# Patient Record
Sex: Male | Born: 1935
Health system: Southern US, Community
[De-identification: ages and names within clinical notes are randomized; demographics above are authoritative.]

## PROBLEM LIST (undated history)

## (undated) DIAGNOSIS — I10 Essential (primary) hypertension: Secondary | ICD-10-CM

## (undated) DIAGNOSIS — E119 Type 2 diabetes mellitus without complications: Secondary | ICD-10-CM

## (undated) DIAGNOSIS — J449 Chronic obstructive pulmonary disease, unspecified: Secondary | ICD-10-CM

## (undated) DIAGNOSIS — F32A Depression, unspecified: Secondary | ICD-10-CM

## (undated) DIAGNOSIS — E785 Hyperlipidemia, unspecified: Secondary | ICD-10-CM

## (undated) DIAGNOSIS — E7800 Pure hypercholesterolemia, unspecified: Secondary | ICD-10-CM

## (undated) DIAGNOSIS — K219 Gastro-esophageal reflux disease without esophagitis: Secondary | ICD-10-CM

## (undated) DIAGNOSIS — N189 Chronic kidney disease, unspecified: Secondary | ICD-10-CM

## (undated) DIAGNOSIS — N400 Enlarged prostate without lower urinary tract symptoms: Secondary | ICD-10-CM

## (undated) HISTORY — PX: FOOT SURGERY: SHX648

## (undated) HISTORY — PX: EYE SURGERY: SHX253

---

## 2004-06-17 ENCOUNTER — Ambulatory Visit: Payer: Self-pay | Admitting: Family Medicine

## 2004-08-14 ENCOUNTER — Ambulatory Visit: Payer: Self-pay | Admitting: Family Medicine

## 2004-12-04 ENCOUNTER — Ambulatory Visit: Payer: Self-pay | Admitting: Family Medicine

## 2005-02-05 ENCOUNTER — Ambulatory Visit: Payer: Self-pay | Admitting: Family Medicine

## 2005-06-09 ENCOUNTER — Ambulatory Visit: Payer: Self-pay | Admitting: Family Medicine

## 2005-09-30 ENCOUNTER — Ambulatory Visit: Payer: Self-pay | Admitting: Family Medicine

## 2006-01-28 ENCOUNTER — Ambulatory Visit: Payer: Self-pay | Admitting: Family Medicine

## 2006-06-06 ENCOUNTER — Ambulatory Visit: Payer: Self-pay | Admitting: Family Medicine

## 2006-06-08 ENCOUNTER — Ambulatory Visit: Payer: Self-pay | Admitting: Family Medicine

## 2006-10-08 ENCOUNTER — Ambulatory Visit: Payer: Self-pay | Admitting: Family Medicine

## 2009-05-31 ENCOUNTER — Ambulatory Visit: Payer: Self-pay | Admitting: Internal Medicine

## 2009-05-31 DIAGNOSIS — R05 Cough: Secondary | ICD-10-CM

## 2009-05-31 DIAGNOSIS — I10 Essential (primary) hypertension: Secondary | ICD-10-CM | POA: Insufficient documentation

## 2009-05-31 DIAGNOSIS — Z8669 Personal history of other diseases of the nervous system and sense organs: Secondary | ICD-10-CM

## 2009-05-31 DIAGNOSIS — E785 Hyperlipidemia, unspecified: Secondary | ICD-10-CM | POA: Insufficient documentation

## 2009-05-31 DIAGNOSIS — E782 Mixed hyperlipidemia: Secondary | ICD-10-CM | POA: Insufficient documentation

## 2009-05-31 HISTORY — DX: Personal history of other diseases of the nervous system and sense organs: Z86.69

## 2009-07-04 ENCOUNTER — Ambulatory Visit: Payer: Self-pay | Admitting: Internal Medicine

## 2009-08-07 ENCOUNTER — Ambulatory Visit: Payer: Self-pay | Admitting: Internal Medicine

## 2009-08-07 LAB — CONVERTED CEMR LAB
Basophils Absolute: 0.1 10*3/uL (ref 0.0–0.1)
Calcium: 9.4 mg/dL (ref 8.4–10.5)
Chloride: 104 meq/L (ref 96–112)
Creatinine, Ser: 1.2 mg/dL (ref 0.4–1.5)
Eosinophils Absolute: 0.6 10*3/uL (ref 0.0–0.7)
GFR calc non Af Amer: 62.87 mL/min (ref >60)
HCT: 43.6 % (ref 39.0–52.0)
Hemoglobin: 14.5 g/dL (ref 13.0–17.0)
Lymphocytes Relative: 28.1 % (ref 12.0–46.0)
MCHC: 33.3 g/dL (ref 30.0–36.0)
MCV: 101.4 fL — ABNORMAL HIGH (ref 78.0–100.0)
Monocytes Absolute: 0.9 10*3/uL (ref 0.1–1.0)
Monocytes Relative: 11.1 % (ref 3.0–12.0)
Neutro Abs: 4.4 10*3/uL (ref 1.4–7.7)
Neutrophils Relative %: 52.5 % (ref 43.0–77.0)
Platelets: 238 10*3/uL (ref 150.0–400.0)
Sodium: 141 meq/L (ref 135–145)

## 2009-08-08 ENCOUNTER — Encounter: Admission: RE | Admit: 2009-08-08 | Discharge: 2009-08-08 | Payer: Self-pay | Admitting: Internal Medicine

## 2009-09-06 ENCOUNTER — Ambulatory Visit: Payer: Self-pay | Admitting: Internal Medicine

## 2009-10-08 ENCOUNTER — Ambulatory Visit: Payer: Self-pay | Admitting: Internal Medicine

## 2009-10-08 DIAGNOSIS — J310 Chronic rhinitis: Secondary | ICD-10-CM

## 2009-10-31 ENCOUNTER — Telehealth (INDEPENDENT_AMBULATORY_CARE_PROVIDER_SITE_OTHER): Payer: Self-pay | Admitting: *Deleted

## 2009-11-19 ENCOUNTER — Ambulatory Visit: Payer: Self-pay | Admitting: Internal Medicine

## 2009-12-03 ENCOUNTER — Ambulatory Visit: Payer: Self-pay | Admitting: Internal Medicine

## 2010-01-14 ENCOUNTER — Ambulatory Visit: Payer: Self-pay | Admitting: Internal Medicine

## 2010-04-22 ENCOUNTER — Ambulatory Visit: Payer: Self-pay | Admitting: Internal Medicine

## 2010-07-22 ENCOUNTER — Ambulatory Visit: Payer: Self-pay | Admitting: Internal Medicine

## 2010-09-10 NOTE — Progress Notes (Signed)
Summary: instructions/ inhaler > stay on qvar  Phone Note Call from Patient Call back at Home Phone [phone]   Caller: Patient Call For: wert Summary of Call: pt is using QVAR and is doing well. has followed all dr wert's instructions. wants to know if it's "necessary that he use the QVAR" .  Leave detailed msg per pt Initial call taken by: Tivis Ringer, CNA,  October 31, 2009 9:25 AM  Follow-up for Phone Call        Dr. Sherene Sires, please advise if you want pt to stay on the qvar thanks Vernie Murders  October 31, 2009 9:28 AM It is maintenance inhaler, not as needed so he should stay on it until next ov if at all possible (can give samples) Follow-up by: Nyoka Cowden MD,  October 31, 2009 10:17 AM  Additional Follow-up for Phone Call Additional follow up Details #1::        Spoke with pt and advised that he needs to stay on the qvar at least until we see him back.  He states that he actually never started using qvar.  He states that after finished abx and pred taper his cough and increased SOB have resolved.  He actually wants to know if he needs to start this med.  Please advise thanks Additional Follow-up by: Vernie Murders,  October 31, 2009 10:32 AM    Additional Follow-up for Phone Call Additional follow up Details #2::    ok to leave off but at the very first sign of a relapse start the qvar Follow-up by: Nyoka Cowden MD,  October 31, 2009 1:00 PM  Additional Follow-up for Phone Call Additional follow up Details #3:: Details for Additional Follow-up Action Taken: [facility] advising pt of the above recs per MW per pt request. Vernie Murders  October 31, 2009 1:06 PM

## 2010-09-10 NOTE — Assessment & Plan Note (Signed)
Summary: Pulmonary/ ext f/u ov with hfa 50%, start qvar 40 as maint rx   Visit Type:  Follow-up Copy to:  Dr. Lysbeth Galas Primary Provider/Referring Provider:  Dr. Lysbeth Galas  CC:  Pt here for follow up. Pt c/o productive cough returning 1 1/2 week ago in the AM's with yellow to beige mucus. .  History of Present Illness: 74 yowm quit smoking around 1998 with no resp problems then,stopped due to wife's sensitivity.  May 31, 2009 ov for initial cough started in Fall 2009 eval by Dr Lazarus Salines dx with GERD while on prilosec rec two times a day dosing but no effect.   c/o early am and after supper worse also at hs but does not typically wake him up.  in am cough up < tbsp of clear mucus, otherwise dry. Assoc with nasal congestion worse than usual with occ sneezing.  rec   Prednisone 10 mg 4 each am x 2days, 2x2days, 1x2days and stop  Pepcid ac 20 mg at bedtime Dexilant 60 mg Take  one 30-60 min before first meal of the day   July 04, 2009 1 month followup.  Pt still c/o cough.  States that cough is prod in the am with clear sputum.  He feels that cough gets better during the day and gets worse at night- especially worse when lies down.  back on prilosec at hs and following the diet, feels prednisone worked the best anything has ever worked.  rec singulair and astepro and continue ppi two times a day plus pepcid at bedtime  August 07, 2009 1 month followup.  Pt states that cough has worsened over the past 2 wks.  Cough is prod with clear sptutum.  Pt states the cough bothers him all day and night now.  Most of the mucus is first thing in am. astepro helping nasal discharge. rec Astepro at bedtime  stop  singulair 10 mg one in evening  Pepcid 20 mg one at bedtime Start Chlortrimeton 4 mg one at bedtime but stop it if it bothers your urination.   September 06, 2009 1 month followup.  Pt states that his breathing has been okay.  He feels that he does better while on prednisone.  He c/o prod cough with  clear sputum x 2 wks day > night or early am. rec prednisone and omnaris,  100% better while on prednisone, then caught cold now mucus is yellow in amFebruary 28, 2011 4 wk followup.  Pt c/o cough x 1 wk- early am cough is prod with yellow sputum.  He also states that he has had slight increase in SOB.   Did 100% better the month of March after rx with one daily prilosec, omnaris, short course of prednsione and augmentin.rec qvar but did not start it but did well the muonth of March on omnaris and prilosec.  November 19, 2009 Pt here for follow up. Pt c/o productive cough returning 1 1/2 week ago in the AM's with yellow to beige mucus. Pt denies any significant sore throat, dysphagia, itching, sneezing,  nasal congestion or excess secretions,  fever, chills, sweats, unintended wt loss, pleuritic or exertional cp, hempoptysis, change in activity tolerance  orthopnea pnd or leg swelling Pt also denies any obvious fluctuation in symptoms with weather or environmental change or other alleviating or aggravating factors.       Current Medications (verified): 1)  Diovan 160 Mg Tabs (Valsartan) .Marland Kitchen.. 1 Once Daily 2)  Prilosec Otc 20 Mg Tbec (Omeprazole Magnesium) .Marland KitchenMarland KitchenMarland Kitchen  Take One 30-60 Min Before First and Last Meals of The Day 3)  Pepcid 20 Mg Tabs (Famotidine) .... One At Bedtime 4)  Refresh Tears 0.5 % Soln (Carboxymethylcellulose Sodium) .Marland Kitchen.. 1 Drop Each Eye Two Times A Day 5)  Chlor-Trimeton 4 Mg Tabs (Chlorpheniramine Maleate) .... Take 1 Tab By Mouth At Bedtime 6)  Omnaris 50 Mcg/act Susp (Ciclesonide) .... One Twice Daily Each Nostril 7)  Simvastatin 20 Mg Tabs (Simvastatin) .Marland Kitchen.. 1 At Bedtime 8)  Multivitamins   Tabs (Multiple Vitamin) .... Take 1 Tablet By Mouth Once A Day 9)  Aspirin 81 Mg  Tabs (Aspirin) .... Take 1 Tablet By Mouth Once A Day 10)  Nasal Saline .... As Needed  Allergies (verified): No Known Drug Allergies  Past History:  Past Medical History: RETINAL DETACHMENT, BILATERAL, HX OF  (ICD-V12.49) HYPERTENSION (ICD-401.9) HYPERLIPIDEMIA (ICD-272.4) COUGH.................................................................Marland KitchenWert     - onset 2009     - w/u by [facility] 2010     - Start Singulair July 04, 2009 > no response August 07, 2009  so d/c     - Sinus and Chest CT ordered August 07, 2009 > chronic thickening of sinuses only finding with coronary       calcifications      - HFA 50% November 19, 2009   Vital Signs:  Patient profile:   77 year old male Height:      73 inches Weight:      221 pounds O2 Sat:      93 % on Room air Temp:     98.2 degrees F Pulse rate:   77 / minute BP sitting:   130 / 74  (left arm) Cuff size:   large  Vitals Entered By: Zackery Barefoot CMA (November 19, 2009 9:12 AM)  O2 Flow:  Room air CC: Pt here for follow up. Pt c/o productive cough returning 1 1/2 week ago in the AM's with yellow to beige mucus.  Comments Medications reviewed with patient Verified contact number and pharmacy with patient Zackery Barefoot [facility]  November 19, 2009 9:13 AM     Physical Exam  Additional Exam:  wt  222  May 31, 2009 >  229 August 07, 2009 > 228 September 06, 2009 > 228 October 08, 2009 > 221 November 19, 2009  hoarse pleasant amb wm nad  HEENT: nl dentition,  and orophanx. Nl external ear canals without cough reflex, mod turbinate edema, non specific NECK :  without JVD/Nodes/TM/ nl carotid upstrokes bilaterally LUNGS: no acc muscle use, clear to A and P bilaterally without cough on insp or exp maneuvers CV:  RRR  no s3 or murmur or increase in P2, no edema  ABD:  soft and nontender with nl excursion in the supine position. No bruits or organomegaly, bowel sounds nl MS:  warm without deformities, calf tenderness, cyanosis or clubbing      Impression & Recommendations:  Problem # 1:  COUGH (ICD-786.2)  The most common causes of chronic cough in immunocompetent adults include: upper airway cough syndrome (UACS), previously referred to as  postnasal drip syndrome,  caused by variety of rhinosinus conditions; (2) asthma; (3) GERD; (4) chronic bronchitis from cigarette smoking or other inhaled environmental irritants; (5) nonasthmatic eosinophilic bronchitis; and (6) bronchiectasis. These conditions, singly or in combination, have accounted for up to 94% of the causes of chronic cough in prospective studies.    This cough is impressively responsive to prednisone but not max gerd so ddx is eos rhinitis/sinusitis  vs bronchitis which may require fob for eval   I spent extra time with the patient today explaining optimal mdi  technique.  This improved from  25-50 % though immediate cough on insp so needs to wait until cough is better and this time go ahead and start qvar to cover the possibility of cough variant asthma.   Each maintenance medication was reviewed in detail including most importantly the difference between maintenance and as needed and under what circumstances the prns are to be used. struggling with concept of med reconciliation.  To keep things simple, I have asked the patient to first separate medicines that are perceived as maintenance, that is to be taken daily "no matter what", from those medicines that are taken on only on an as-needed basis and I have given the patient examples of both, and then return to see our NP to generate a  detailed  medication calendar which should be followed until the next physician sees the patient and updates it.   Once we're sure that we're all reading from the same page in terms of medication admiistration, she needs to be scheduled to follow up with me   Orders: Est. Patient Level IV (03474)  Medications Added to Medication List This Visit: 1)  Refresh Tears 0.5 % Soln (Carboxymethylcellulose sodium) .Marland Kitchen.. 1 drop each eye two times a day 2)  Multivitamins Tabs (Multiple vitamin) .... Take 1 tablet by mouth once a day 3)  Aspirin 81 Mg Tabs (Aspirin) .... Take 1 tablet by mouth once a  day 4)  Nasal Saline  .... As needed 5)  Chlor-trimeton 4 Mg Tabs (Chlorpheniramine maleate) .... Take 1 tab by mouth at bedtime 6)  Qvar 40 Mcg/act Aers (Beclomethasone dipropionate) .... 2 puffs first thing  in am and 2 puffs again in pm about 12 hours later 7)  Prednisone 10 Mg Tabs (Prednisone) .... 4 each am x 2days, 2x2days, 1x2days and stop 8)  Augmentin 875-125 Mg Tabs (Amoxicillin-pot clavulanate) .... By mouth twice daily  Patient Instructions: 1)  Only  Take prilosec  one 30-60 min before first meal of the day 2)  stay on omnaris 3)  Augmentin x 10 days 4)  Prednisone 10 mg 4 each am x 2 days,  3 x 2days, 2x2 days, and 1x2 days  5)  Qvar 40 2 puffs first thing  in am and 2 puffs again in pm about 12 hours later- don't start until cough is better 6)  Please schedule a follow-up appointment in 6 weeks, sooner if needed  7)  Work on inhaler technique:  relax and blow all the way out then take a nice smooth deep breath back in, triggering the inhaler at same time you start breathing in and hold for at least a few secs 8)  See Tammy NP w/in 2 weeks with all your medications, even over the counter meds, separated in two separate bags, the ones you take no matter what vs the ones you stop once you feel better and take only as needed.  She will generate for you a new user friendly medication calendar that will put [place] all on the same page re: your medication use.  Prescriptions: AUGMENTIN 875-125 MG  TABS (AMOXICILLIN-POT CLAVULANATE) By mouth twice daily  #20 x 0   Entered and Authorized by:   Nyoka Cowden MD   Signed by:   Nyoka Cowden MD on 11/19/2009   Method used:   Print then Give to Patient  RxID:   [id number] PREDNISONE 10 MG  TABS (PREDNISONE) 4 each am x 2days, 2x2days, 1x2days and stop  #14 x 0   Entered and Authorized by:   Nyoka Cowden MD   Signed by:   Nyoka Cowden MD on 11/19/2009   Method used:   Print then Give to Patient   RxID:    [id number]    Immunization History:  Influenza Immunization History:    Influenza:  historical (05/14/2009)

## 2010-09-10 NOTE — Assessment & Plan Note (Signed)
Summary: NP follow up - med calendar   Copy to:  Dr. Lysbeth Galas Primary Provider/Referring Provider:  Dr. Lysbeth Galas  CC:  new med calendar, pt brought all meds with him today - pt c/o prod cough with yellow mucus x3days, denies wheezing, and SOB.  History of Present Illness: 67 yowm quit smoking around 1998 with no resp problems then,stopped due to wife's sensitivity.  May 31, 2009 ov for initial cough started in Fall 2009 eval by Dr Lazarus Salines dx with GERD while on prilosec rec two times a day dosing but no effect.   c/o early am and after supper worse also at hs but does not typically wake him up.  in am cough up < tbsp of clear mucus, otherwise dry. Assoc with nasal congestion worse than usual with occ sneezing.  rec   Prednisone 10 mg 4 each am x 2days, 2x2days, 1x2days and stop  Pepcid ac 20 mg at bedtime Dexilant 60 mg Take  one 30-60 min before first meal of the day   July 04, 2009 1 month followup.  Pt still c/o cough.  States that cough is prod in the am with clear sputum.  He feels that cough gets better during the day and gets worse at night- especially worse when lies down.  back on prilosec at hs and following the diet, feels prednisone worked the best anything has ever worked.  rec singulair and astepro and continue ppi two times a day plus pepcid at bedtime  August 07, 2009 1 month followup.  Pt states that cough has worsened over the past 2 wks.  Cough is prod with clear sptutum.  Pt states the cough bothers him all day and night now.  Most of the mucus is first thing in am. astepro helping nasal discharge. rec Astepro at bedtime  stop  singulair 10 mg one in evening  Pepcid 20 mg one at bedtime Start Chlortrimeton 4 mg one at bedtime but stop it if it bothers your urination.   September 06, 2009 1 month followup.  Pt states that his breathing has been okay.  He feels that he does better while on prednisone.  He c/o prod cough with clear sputum x 2 wks day > night or early am.  rec prednisone and omnaris,  100% better while on prednisone, then caught cold now mucus is yellow in amFebruary 28, 2011 4 wk followup.  Pt c/o cough x 1 wk- early am cough is prod with yellow sputum.  He also states that he has had slight increase in SOB.   Did 100% better the month of March after rx with one daily prilosec, omnaris, short course of prednsione and augmentin.rec qvar but did not start it but did well the muonth of March on omnaris and prilosec.  November 19, 2009 Pt here for follow up. Pt c/o productive cough returning 1 1/2 week ago in the AM's with yellow to beige mucus.    December 03, 2009--Presents for follow up and med review. Last visit w/ acute bronchitis/sinusitis. Tx w/ Augemntin and steroid taper. He is feeling much better. Now cough free. He is worried the cough will come back, he has been running in a cough cycle he gets better on abx/steroids then after finishing them cough starts to slowly come back. Currently he is cough free. He did start QVAR last week when cough stopped. Denies chest pain, dyspnea, orthopnea, hemoptysis, fever, n/v/d, edema, headache. We reviewed med and organized them into a med calendar.  Preventive Screening-Counseling & Management  Alcohol-Tobacco     Smoking Status: quit  Medications Prior to Update: 1)  Diovan 160 Mg Tabs (Valsartan) .Marland Kitchen.. 1 Once Daily 2)  Prilosec Otc 20 Mg Tbec (Omeprazole Magnesium) .... Take One 30-60 Min Before First and Last Meals of The Day 3)  Pepcid 20 Mg Tabs (Famotidine) .... One At Bedtime 4)  Refresh Tears 0.5 % Soln (Carboxymethylcellulose Sodium) .Marland Kitchen.. 1 Drop Each Eye Two Times A Day 5)  Omnaris 50 Mcg/act Susp (Ciclesonide) .... One Twice Daily Each Nostril 6)  Simvastatin 20 Mg Tabs (Simvastatin) .Marland Kitchen.. 1 At Bedtime 7)  Multivitamins   Tabs (Multiple Vitamin) .... Take 1 Tablet By Mouth Once A Day 8)  Aspirin 81 Mg  Tabs (Aspirin) .... Take 1 Tablet By Mouth Once A Day 9)  Nasal Saline .... As Needed 10)   Chlor-Trimeton 4 Mg Tabs (Chlorpheniramine Maleate) .... Take 1 Tab By Mouth At Bedtime 11)  Qvar 40 Mcg/act  Aers (Beclomethasone Dipropionate) .... 2 Puffs First Thing  in Am and 2 Puffs Again in Pm About 12 Hours Later 12)  Prednisone 10 Mg  Tabs (Prednisone) .... 4 Each Am X 2days, 2x2days, 1x2days and Stop 13)  Augmentin 875-125 Mg  Tabs (Amoxicillin-Pot Clavulanate) .... By Mouth Twice Daily  Allergies (verified): No Known Drug Allergies  Past History:  Family History: Last updated: 05/31/2009 Heart dz- Mother Colon CA- Sister  Social History: Last updated: 05/31/2009 Married Children Former smoker.  Quit in 1998.  Smoked for approx 40 yrs up to 1/2 ppd.  Smoked a pipe also.  No ETOH Retired  Past Medical History: RETINAL DETACHMENT, BILATERAL, HX OF (ICD-V12.49) HYPERTENSION (ICD-401.9) HYPERLIPIDEMIA (ICD-272.4) COUGH.................................................................Marland KitchenWert     - onset 2009     - w/u by [facility] 2010     - Start Singulair July 04, 2009 > no response August 07, 2009  so d/c     - Sinus and Chest CT ordered August 07, 2009 > chronic thickening of sinuses only finding with coronary       calcifications      - HFA 50% November 19, 2009  Complex med regimen--Meds reviewed with pt education and computerized med calendar completed December 03, 2009   Social History: Smoking Status:  quit  Review of Systems      See HPI  Vital Signs:  Patient profile:   77 year old male Height:      73 inches Weight:      222.25 pounds BMI:     29.43 O2 Sat:      95 % on Room air Temp:     98.8 degrees F oral Pulse rate:   78 / minute BP sitting:   142 / 84  (left arm) Cuff size:   regular  Vitals Entered By: Boone Master CNA (December 03, 2009 9:01 AM)  O2 Flow:  Room air CC: new med calendar, pt brought all meds with him today - pt c/o prod cough with yellow mucus x3days, denies wheezing, SOB Is Patient Diabetic? No Comments Medications reviewed  with patient Daytime contact number verified with patient. Boone Master CNA  December 03, 2009 9:02 AM    Physical Exam  Additional Exam:  wt  222  May 31, 2009 >  229 August 07, 2009 > 228 September 06, 2009 > 228 October 08, 2009 > 221 November 19, 2009 >>222 December 03, 2009  hoarse pleasant amb wm nad  HEENT: nl dentition,  and orophanx. Nl external  ear canals without cough reflex, mod turbinate edema, non specific NECK :  without JVD/Nodes/TM/ nl carotid upstrokes bilaterally LUNGS: no acc muscle use, clear to A and P bilaterally without cough on insp or exp maneuvers CV:  RRR  no s3 or murmur or increase in P2, no edema  ABD:  soft and nontender with nl excursion in the supine position. No bruits or organomegaly, bowel sounds nl MS:  warm without deformities, calf tenderness, cyanosis or clubbing      Impression & Recommendations:  Problem # 1:  COUGH (ICD-786.2)  Cyclical cough aggravated  by chronic rhinitis and suspected GERD.  Meds reviewed with pt education and computerized med calendar completed/adjusted.  Continue on GERD/Rhinitis prevention add delsym if cough returns.  follow up 6 weeks and as needed   Orders: Est. Patient Level III (16109) Prescription Created Electronically [phone])  Medications Added to Medication List This Visit: 1)  Fluticasone Propionate 50 Mcg/act Susp (Fluticasone propionate) .... 2 puffs each nostril two times a day  Complete Medication List: 1)  Diovan 160 Mg Tabs (Valsartan) .Marland Kitchen.. 1 once daily 2)  Prilosec Otc 20 Mg Tbec (Omeprazole magnesium) .... Take one 30-60 min before first and last meals of the day 3)  Pepcid 20 Mg Tabs (Famotidine) .... One at bedtime 4)  Refresh Tears 0.5 % Soln (Carboxymethylcellulose sodium) .Marland Kitchen.. 1 drop each eye two times a day 5)  Omnaris 50 Mcg/act Susp (Ciclesonide) .... One twice daily each nostril 6)  Simvastatin 20 Mg Tabs (Simvastatin) .Marland Kitchen.. 1 at bedtime 7)  Multivitamins Tabs (Multiple vitamin) ....  Take 1 tablet by mouth once a day 8)  Aspirin 81 Mg Tabs (Aspirin) .... Take 1 tablet by mouth once a day 9)  Nasal Saline  .... As needed 10)  Chlor-trimeton 4 Mg Tabs (Chlorpheniramine maleate) .... Take 1 tab by mouth at bedtime 11)  Qvar 40 Mcg/act Aers (Beclomethasone dipropionate) .... 2 puffs first thing  in am and 2 puffs again in pm about 12 hours later 12)  Prednisone 10 Mg Tabs (Prednisone) .... 4 each am x 2days, 2x2days, 1x2days and stop 13)  Augmentin 875-125 Mg Tabs (Amoxicillin-pot clavulanate) .... By mouth twice daily 14)  Fluticasone Propionate 50 Mcg/act Susp (Fluticasone propionate) .... 2 puffs each nostril two times a day  Patient Instructions: 1)  Follow med calendar closley and bring to each visit.  2)  GOAL IS TO GET AHEAD OF THE COUGH AND PREVENT IT-AVOID COUGHING AND THROAT CLEARING 3)  Use water, ice chips, sugarless candy-NO  MINTS, to help avoid coughing and throat clearing.  4)  Please contact office for sooner follow up if symptoms do not improve or worsen  5)  follow up 6 weeks Dr. Sherene Sires  uPrescriptions: FLUTICASONE PROPIONATE 50 MCG/ACT SUSP (FLUTICASONE PROPIONATE) 2 puffs each nostril two times a day  #1 x 11   Entered and Authorized by:   Rubye Oaks NP   Signed by:   Kunaal Walkins NP on 12/03/2009   Method used:   Print then Give to Patient   RxID:   [id number] QVAR 40 MCG/ACT  AERS (BECLOMETHASONE DIPROPIONATE) 2 puffs first thing  in am and 2 puffs again in pm about 12 hours later  #1 x 11   Entered and Authorized by:   Rubye Oaks NP   Signed by:   Aarian Griffie NP on 12/03/2009   Method used:   Print then Give to Patient   RxID:   [id number]  Appended Document: med calendar update    Clinical Lists Changes  Medications: Changed medication from PRILOSEC OTC 20 MG TBEC (OMEPRAZOLE MAGNESIUM) Take one 30-60 min before first and last meals of the day to PRILOSEC OTC 20 MG TBEC (OMEPRAZOLE MAGNESIUM) Take 1 tablet by mouth  once a day before meal Changed medication from CHLOR-TRIMETON 4 MG TABS (CHLORPHENIRAMINE MALEATE) Take 1 tab by mouth at bedtime to CHLOR-TRIMETON 4 MG TABS (CHLORPHENIRAMINE MALEATE) Take 2 tabs by mouth at bedtime Changed medication from OMNARIS 50 MCG/ACT SUSP (CICLESONIDE) one twice daily each nostril to OMNARIS 50 MCG/ACT SUSP (CICLESONIDE) 2 puffs each nostril two times a day Added new medication of CHLOR-TRIMETON 4 MG TABS (CHLORPHENIRAMINE MALEATE) 1 extra tab by mouth every 4 hours as needed Added new medication of DELSYM 30 MG/5ML LQCR (DEXTROMETHORPHAN POLISTIREX) 2 teaspoons every 12 hours as needed Changed medication from * NASAL SALINE as needed to SALINE NASAL SPRAY 0.65 % SOLN (SALINE) 2 puffs every 4 hours as needed Removed medication of AUGMENTIN 875-125 MG  TABS (AMOXICILLIN-POT CLAVULANATE) By mouth twice daily Removed medication of PREDNISONE 10 MG  TABS (PREDNISONE) 4 each am x 2days, 2x2days, 1x2days and stop Removed medication of ASPIRIN 81 MG  TABS (ASPIRIN) Take 1 tablet by mouth once a day Removed medication of MULTIVITAMINS   TABS (MULTIPLE VITAMIN) Take 1 tablet by mouth once a day Removed medication of REFRESH TEARS 0.5 % SOLN (CARBOXYMETHYLCELLULOSE SODIUM) 1 drop each eye two times a day Removed medication of PEPCID 20 MG TABS (FAMOTIDINE) one at bedtime

## 2010-09-10 NOTE — Assessment & Plan Note (Signed)
Summary: Pulmonary/  ext summary f/u cough c/w uacs, try omnaris   Copy to:  Dr. Lysbeth Galas Primary Provider/Referring Provider:  Dr. Lysbeth Galas  CC:  1 month followup.  Pt states that his breathing has been okay.  He feels that he does better while on prednisone.  He c/o prod cough with clear sputum x 2 wks.  .  History of Present Illness: 77 [place] Cooley quit smoking around 1998 with no resp problems then,stopped due to wife's sensitivity.  May 31, 2009 ov for initial cough started in Fall 2009 eval by Dr Lazarus Salines dx with GERD while on prilosec rec two times a day dosing but no effect.   c/o early am and after supper worse also at hs but does not typically wake him up.  in am cough up < tbsp of clear mucus, otherwise dry. Assoc with nasal congestion worse than usual with occ sneezing.  rec   Prednisone 10 mg 4 each am x 2days, 2x2days, 1x2days and stop  Pepcid ac 20 mg at bedtime Dexilant 60 mg Take  one 30-60 min before first meal of the day   July 04, 2009 1 month followup.  Pt still c/o cough.  States that cough is prod in the am with clear sputum.  He feels that cough gets better during the day and gets worse at night- especially worse when lies down.  back on prilosec at hs and following the diet, feels prednisone worked the best anything has ever worked.  rec singulair and astepro and continue ppi two times a day plus pepcid at bedtime  August 07, 2009 1 month followup.  Pt states that cough has worsened over the past 2 wks.  Cough is prod with clear sptutum.  Pt states the cough bothers him all day and night now.  Most of the mucus is first thing in am. astepro helping nasal discharge. rec Astepro at bedtime  stop  singulair 10 mg one in evening  Pepcid 20 mg one at bedtime Start Chlortrimeton 4 mg one at bedtime but stop it if it bothers your urination.   September 06, 2009 1 month followup.  Pt states that his breathing has been okay.  He feels that he does better while on prednisone.  He  c/o prod cough with clear sputum x 2 wks day > night or early am.  Pt denies any significant sore throat, dysphagia, itching, sneezing,  nasal congestion or excess secretions,  fever, chills, sweats, unintended wt loss, pleuritic or exertional cp, hempoptysis, change in activity tolerance  orthopnea pnd or leg swelling.   Current Medications (verified): 1)  Diovan 160 Mg Tabs (Valsartan) .Marland Kitchen.. 1 Once Daily 2)  Xlear (Otc) .... 2 Sprays Each Nostril Every Am 3)  Prilosec Otc 20 Mg Tbec (Omeprazole Magnesium) .... Take One 30-60 Min Before First and Last Meals of The Day 4)  Pepcid 20 Mg Tabs (Famotidine) .... One At Bedtime 5)  Refresh Tears 0.5 % Soln (Carboxymethylcellulose Sodium) .Marland Kitchen.. 1 Drop Each Eye Three Times A Day 6)  Chlor-Trimeton 4 Mg Tabs (Chlorpheniramine Maleate) .Marland Kitchen.. 1 Every 6 Hours As Needed 7)  Fluticasone Propionate 50 Mcg/act Susp (Fluticasone Propionate) .Marland Kitchen.. 1 Spray Each Nostril Two Times A Day  Allergies (verified): No Known Drug Allergies  Past History:  Past Medical History: RETINAL DETACHMENT, BILATERAL, HX OF (ICD-V12.49) HYPERTENSION (ICD-401.9) HYPERLIPIDEMIA (ICD-272.4) COUGH...............................................................Marland KitchenWert     - onset 2009     - w/u by [facility] 2010     - Start Singulair  July 04, 2009 > no response August 07, 2009  so d/c     - Sinus and Chest CT ordered August 07, 2009 > chronic thickening of sinuses only finding with coronary       calcifications  Vital Signs:  Patient profile:   77 year old male Weight:      228 pounds O2 Sat:      96 % on Room air Temp:     98.1 degrees F oral Pulse rate:   78 / minute BP sitting:   130 / 82  (left arm)  Vitals Entered By: Vernie Murders (September 06, 2009 9:08 AM)  O2 Flow:  Room air  Physical Exam  Additional Exam:  wt  222  May 31, 2009 > 226 July 04, 2009 > 229 August 07, 2009 > 228 September 06, 2009  hoarse pleasant amb wm nad HEENT: nl dentition,  and  orophanx. Nl external ear canals without cough reflex, mod turbinate edema, non specific NECK :  without JVD/Nodes/TM/ nl carotid upstrokes bilaterally LUNGS: no acc muscle use, clear to A and P bilaterally without cough on insp or exp maneuvers CV:  RRR  no s3 or murmur or increase in P2, no edema  ABD:  soft and nontender with nl excursion in the supine position. No bruits or organomegaly, bowel sounds nl MS:  warm without deformities, calf tenderness, cyanosis or clubbing      Impression & Recommendations:  Problem # 1:  COUGH (ICD-786.2)  The most common causes of chronic cough in immunocompetent adults include: upper airway cough syndrome (UACS), previously referred to as postnasal drip syndrome,  caused by variety of rhinosinus conditions; (2) asthma; (3) GERD; (4) chronic bronchitis from cigarette smoking or other inhaled environmental irritants; (5) nonasthmatic eosinophilic bronchitis; and (6) bronchiectasis. These conditions, singly or in combination, have accounted for up to 94% of the causes of chronic cough in prospective studies.  Feel  this is most c/w Upper airway cough syndrome, so named because it's frequently impossible to sort out how much is LPR vs CR/sinusitis with freq throat clearing generating secondary extra esophageal GERD from wide swings in gastric pressure that occur with throat clearing, promoting self use of mint and menthol lozenges that reduce the lower esophageal sphincter tone and exacerbate the problem further.  These symptoms are easily confused with asthma/copd by even experienced pulmonogists because they overlap so much. These are the same pts who not infrequently have failed to tolerate ace inhibitors,  dry powder inhalers or biphosphonates or report having reflux symptoms that don't respond to standard doses of PPI  REC max rx directed at rhinitis,  then consider trial of inhaled steroids to cover possibility of asthma.  Emphasized to pt  that unlike  when you get a prescription for eyeglasses, it's not possible to always walk out of this or any medical office with a perfect prescription that is immediately effective  based on any test that we offer here.  On the contrary, it may take several weeks for the full impact of changes recommened today - hopefully you will respond well.  If not, then we'll adjust your medication on your next visit accordingly, knowing more then than we can possibly know now.     Orders: Est. Patient Level IV (27062)  Medications Added to Medication List This Visit: 1)  Refresh Tears 0.5 % Soln (Carboxymethylcellulose sodium) .Marland Kitchen.. 1 drop each eye three times a day 2)  Chlor-trimeton 4 Mg Tabs (Chlorpheniramine maleate) .Marland KitchenMarland KitchenMarland Kitchen  1 every 6 hours as needed 3)  Fluticasone Propionate 50 Mcg/act Susp (Fluticasone propionate) .Marland Kitchen.. 1 spray each nostril two times a day 4)  Omnaris 50 Mcg/act Susp (Ciclesonide) .... One twice daily each nostril 5)  Prednisone 10 Mg Tabs (Prednisone) .... 4 each am x 2 days,  3 x 2days, 2x2 days, and 1x2 days  Patient Instructions: 1)  Prednisone 10 mg 4 each am x 2 days,  3 x 2days, 2x2 days, and 1x2 days  2)  Omnaris 1 twice daily each nostril  3)  Please schedule a follow-up appointment in 4 weeks, sooner if needed  Prescriptions: OMNARIS 50 MCG/ACT SUSP (CICLESONIDE) one twice daily each nostril  #1 x 11   Entered and Authorized by:   Nyoka Cowden MD   Signed by:   Nyoka Cowden MD on 09/06/2009   Method used:   Print then Give to Patient   RxID:   [id number] PREDNISONE 10 MG  TABS (PREDNISONE) 4 each am x 2 days,  3 x 2days, 2x2 days, and 1x2 days  #20 x 11   Entered and Authorized by:   Nyoka Cowden MD   Signed by:   Nyoka Cowden MD on 09/06/2009   Method used:   Print then Give to Patient   RxID:   [id number]

## 2010-09-10 NOTE — Assessment & Plan Note (Signed)
Summary: Pulmonary/ f/u ov   Copy to:  Dr. Lysbeth Galas Primary Provider/Referring Provider:  Dr. Lysbeth Galas  CC:  3 month followup.  Pt states breathing is the same- no better or worse.  He states that his cough is better since last seen- still has cough on occ "very little"- mainly non prod except for in the am sometimes produces clear sputum.Marland Kitchen  History of Present Illness: 77 yowm quit smoking around 1998 with no resp problems then, stopped due to wife's sensitivity.  May 31, 2009 ov for initial cough started in Fall 2009 eval by Dr Lazarus Salines dx with GERD while on prilosec rec two times a day dosing but no effect.   c/o early am and after supper worse also at hs but does not typically wake him up.  in am cough up < tbsp of clear mucus, otherwise dry. Assoc with nasal congestion worse than usual with occ sneezing.  rec   Prednisone 10 mg 4 each am x 2days, 2x2days, 1x2days and stop  Pepcid ac 20 mg at bedtime Dexilant 60 mg Take  one 30-60 min before first meal of the day   July 04, 2009 1 month followup.  Pt still c/o cough.  States that cough is prod in the am with clear sputum.  He feels that cough gets better during the day and gets worse at night- especially worse when lies down.  back on prilosec at hs and following the diet, feels prednisone worked the best anything has ever worked.  rec singulair and astepro and continue ppi two times a day plus pepcid at bedtime  August 07, 2009 1 month followup.  Pt states that cough has worsened over the past 2 wks.  Cough is prod with clear sptutum.  Pt states the cough bothers him all day and night now.  Most of the mucus is first thing in am. astepro helping nasal discharge. rec Astepro at bedtime  stop  singulair 10 mg one in evening  Pepcid 20 mg one at bedtime Start Chlortrimeton 4 mg one at bedtime but stop it if it bothers your urination.   September 06, 2009 1 month followup.  Pt states that his breathing has been okay.  He feels that he does  better while on prednisone.  He c/o prod cough with clear sputum x 2 wks day > night or early am. rec prednisone and omnaris,  100% better while on prednisone, then caught cold now mucus is yellow in amFebruary 28, 2011 4 wk followup.  Pt c/o cough x 1 wk- early am cough is prod with yellow sputum.  He also states that he has had slight increase in SOB.   Did 100% better the month of March after rx with one daily prilosec, omnaris, short course of prednsione and augmentin.rec qvar but did not start it but did well the muonth of March on omnaris and prilosec.  November 19, 2009 Pt here for follow up. Pt c/o productive cough returning 1 1/2 week ago in the AM's with yellow to beige mucus.    December 03, 2009--Presents for follow up and med review. Last visit w/ acute bronchitis/sinusitis. Tx w/ Augemntin and steroid taper. He is feeling much better. Now cough free. He is worried the cough will come back, he has been running in a cough cycle he gets better on abx/steroids then after finishing them cough starts to slowly come back. Currently he is cough free. He did start QVAR last week when cough stopped.  January 14, 2010 6 wk followup.  Pt states that his cough is much better.  Still has some occ cough in the am- mainly dry but sometimes produces some clear sputum. rec no change in rx , follow med calendar reviwed  April 22, 2010 3 month followup.  Pt states breathing is the same- no better or worse.  He states that his cough is better since last seen- still has cough on occ "very little"- mainly non prod except for in the am sometimes produces clear sputum. Pt denies any significant sore throat, dysphagia, itching, sneezing,  nasal congestion or excess secretions,  fever, chills, sweats, unintended wt loss, pleuritic or exertional cp, hempoptysis, change in activity tolerance  orthopnea pnd or leg swelling   Current Medications (verified): 1)  Diovan 160 Mg Tabs (Valsartan) .Marland Kitchen.. 1 Once Daily 2)  Simvastatin  20 Mg Tabs (Simvastatin) .Marland Kitchen.. 1 At Bedtime 3)  Prilosec Otc 20 Mg Tbec (Omeprazole Magnesium) .... Take 1 Tablet By Mouth Once A Day Before Meal 4)  Chlor-Trimeton 4 Mg Tabs (Chlorpheniramine Maleate) .... Take 2 Tabs By Mouth At Bedtime 5)  Fluticasone Propionate 50 Mcg/act Susp (Fluticasone Propionate) .... 2 Puffs Each Nostril Two Times A Day 6)  Qvar 40 Mcg/act  Aers (Beclomethasone Dipropionate) .... 2 Puffs First Thing  in Am and 2 Puffs Again in Pm About 12 Hours Later 7)  Chlor-Trimeton 4 Mg Tabs (Chlorpheniramine Maleate) .Marland Kitchen.. 1 Extra Tab By Mouth Every 4 Hours As Needed 8)  Delsym 30 Mg/3ml Lqcr (Dextromethorphan Polistirex) .... 2 Teaspoons Every 12 Hours As Needed 9)  Saline Nasal Spray 0.65 % Soln (Saline) .... 2 Puffs Every 4 Hours As Needed 10)  Optive 0.5-0.9 % Soln (Carboxymethylcellul-Glycerin) .... As Needed  Allergies (verified): No Known Drug Allergies  Past History:  Past Medical History: RETINAL DETACHMENT, BILATERAL, HX OF (ICD-V12.49) HYPERTENSION (ICD-401.9) HYPERLIPIDEMIA (ICD-272.4) COUGH...................................................................Marland KitchenWert     - onset 2009     - w/u by [facility] 2010     - Start Singulair July 04, 2009 > no response August 07, 2009  so d/c     - Sinus and Chest CT ordered August 07, 2009 > chronic thickening of sinuses only finding with coronary       calcifications      - HFA 50% November 19, 2009 > January 14, 2010 @ 90% pre-coaching > 100 % April 22, 2010       - Response to qvar 01/2010 Complex med regimen--Meds reviewed with pt education and computerized med calendar completed December 03, 2009   Vital Signs:  Patient profile:   77 year old male Weight:      219.50 pounds O2 Sat:      95 % on Room air Temp:     98.0 degrees F oral Pulse rate:   67 / minute BP sitting:   150 / 70  (left arm)  Vitals Entered By: Vernie Murders (April 22, 2010 9:19 AM)  O2 Flow:  Room air  Physical  Exam  Additional Exam:  wt  222  May 31, 2009 >       221 November 19, 2009 >>222 December 03, 2009 > 220 January 14, 2010 > 219 April 22, 2010  very minimall hoarse pleasant amb wm nad  HEENT: nl dentition,  and orophanx. Nl external ear canals without cough reflex, mod turbinate edema, non specific NECK :  without JVD/Nodes/TM/ nl carotid upstrokes bilaterally LUNGS: no acc muscle use, clear to  A and P bilaterally without cough on insp or exp maneuvers CV:  RRR  no s3 or murmur or increase in P2, no edema  ABD:  soft and nontender with nl excursion in the supine position. No bruits or organomegaly, bowel sounds nl MS:  warm without deformities, calf tenderness, cyanosis or clubbing      Impression & Recommendations:  Problem # 1:  COUGH (ICD-786.2)  Strong evidence this is chronic non-specific rhinitis with cough variant asthma based on response to empiric rx.  The standardized cough guidelines recently published in Chest are a 14 step process, not a single office visit,  and are intended  to address this problem logically,  with an alogrithm dependent on response to each progressive step  to determine a specific diagnosis with  minimal addtional testing needed. Therefore if compliance is an issue this empiric standardized approach simply won't work.   Thus critical he now stay on maint rx long enough to prove the case and only then very carefully taper off      Each maintenance medication was reviewed in detail including most importantly the difference between maintenance and as needed and under what circumstances the prns are to be used. This was done in the context of a medication calendar review(I reprinted his previous one)  which provided the patient with a user-friendly unambiguous mechanism for medication administration and reconciliation and provides an action plan for all active problems. It is critical that this be shown to every doctor  for modification during the office visit  if necessary so the patient can use it as a working document.      Orders: Est. Patient Level III (29562)  Patient Instructions: 1)  See calendar for specific medication instructions and bring it back for each and every office visit for every healthcare provider you see.  Without it,  you may not receive the best quality medical care that we feel you deserve.

## 2010-09-10 NOTE — Assessment & Plan Note (Signed)
Summary: Pulmonary/ summary f/u with [facility] now 100% and cough gone   Copy to:  Dr. Lysbeth Galas Primary Provider/Referring Provider:  Dr. Lysbeth Galas  CC:  6 wk followup.  Pt states that his cough is much better.  Still has some occ cough in the am- mainly dry but sometimes produces some clear sputum.  No complaints today.Marland Kitchen  History of Present Illness: 34 yowm quit smoking around 1998 with no resp problems then, stopped due to wife's sensitivity.  May 31, 2009 ov for initial cough started in Fall 2009 eval by Dr Lazarus Salines dx with GERD while on prilosec rec two times a day dosing but no effect.   c/o early am and after supper worse also at hs but does not typically wake him up.  in am cough up < tbsp of clear mucus, otherwise dry. Assoc with nasal congestion worse than usual with occ sneezing.  rec   Prednisone 10 mg 4 each am x 2days, 2x2days, 1x2days and stop  Pepcid ac 20 mg at bedtime Dexilant 60 mg Take  one 30-60 min before first meal of the day   July 04, 2009 1 month followup.  Pt still c/o cough.  States that cough is prod in the am with clear sputum.  He feels that cough gets better during the day and gets worse at night- especially worse when lies down.  back on prilosec at hs and following the diet, feels prednisone worked the best anything has ever worked.  rec singulair and astepro and continue ppi two times a day plus pepcid at bedtime  August 07, 2009 1 month followup.  Pt states that cough has worsened over the past 2 wks.  Cough is prod with clear sptutum.  Pt states the cough bothers him all day and night now.  Most of the mucus is first thing in am. astepro helping nasal discharge. rec Astepro at bedtime  stop  singulair 10 mg one in evening  Pepcid 20 mg one at bedtime Start Chlortrimeton 4 mg one at bedtime but stop it if it bothers your urination.   September 06, 2009 1 month followup.  Pt states that his breathing has been okay.  He feels that he does better while on  prednisone.  He c/o prod cough with clear sputum x 2 wks day > night or early am. rec prednisone and omnaris,  100% better while on prednisone, then caught cold now mucus is yellow in amFebruary 28, 2011 4 wk followup.  Pt c/o cough x 1 wk- early am cough is prod with yellow sputum.  He also states that he has had slight increase in SOB.   Did 100% better the month of March after rx with one daily prilosec, omnaris, short course of prednsione and augmentin.rec qvar but did not start it but did well the muonth of March on omnaris and prilosec.  November 19, 2009 Pt here for follow up. Pt c/o productive cough returning 1 1/2 week ago in the AM's with yellow to beige mucus.    December 03, 2009--Presents for follow up and med review. Last visit w/ acute bronchitis/sinusitis. Tx w/ Augemntin and steroid taper. He is feeling much better. Now cough free. He is worried the cough will come back, he has been running in a cough cycle he gets better on abx/steroids then after finishing them cough starts to slowly come back. Currently he is cough free. He did start QVAR last week when cough stopped.   January 14, 2010  6 wk followup.  Pt states that his cough is much better.  Still has some occ cough in the am- mainly dry but sometimes produces some clear sputum.  No complaints today. did not bring in med calendar for review as requested (at top of calendar in bold letters).  Pt denies any significant sore throat, dysphagia, itching, sneezing,  nasal congestion or excess secretions,  fever, chills, sweats, unintended wt loss, pleuritic or exertional cp, hempoptysis, change in activity tolerance  orthopnea pnd or leg swelling .  Current Medications (verified): 1)  Diovan 160 Mg Tabs (Valsartan) .Marland Kitchen.. 1 Once Daily 2)  Simvastatin 20 Mg Tabs (Simvastatin) .Marland Kitchen.. 1 At Bedtime 3)  Prilosec Otc 20 Mg Tbec (Omeprazole Magnesium) .... Take 1 Tablet By Mouth Once A Day Before Meal 4)  Chlor-Trimeton 4 Mg Tabs (Chlorpheniramine  Maleate) .... Take 2 Tabs By Mouth At Bedtime 5)  Fluticasone Propionate 50 Mcg/act Susp (Fluticasone Propionate) .... 2 Puffs Each Nostril Two Times A Day 6)  Qvar 40 Mcg/act  Aers (Beclomethasone Dipropionate) .... 2 Puffs First Thing  in Am and 2 Puffs Again in Pm About 12 Hours Later 7)  Chlor-Trimeton 4 Mg Tabs (Chlorpheniramine Maleate) .Marland Kitchen.. 1 Extra Tab By Mouth Every 4 Hours As Needed 8)  Delsym 30 Mg/46ml Lqcr (Dextromethorphan Polistirex) .... 2 Teaspoons Every 12 Hours As Needed 9)  Saline Nasal Spray 0.65 % Soln (Saline) .... 2 Puffs Every 4 Hours As Needed 10)  Famotidine 20 Mg Tabs (Famotidine) .Marland Kitchen.. 1 At Bedtime 11)  Optive 0.5-0.9 % Soln (Carboxymethylcellul-Glycerin) .... As Needed  Allergies (verified): No Known Drug Allergies  Past History:  Past Medical History: RETINAL DETACHMENT, BILATERAL, HX OF (ICD-V12.49) HYPERTENSION (ICD-401.9) HYPERLIPIDEMIA (ICD-272.4) COUGH..................................................................Marland KitchenWert     - onset 2009     - w/u by [facility] 2010     - Start Singulair July 04, 2009 > no response August 07, 2009  so d/c     - Sinus and Chest CT ordered August 07, 2009 > chronic thickening of sinuses only finding with coronary       calcifications      - HFA 50% November 19, 2009 > January 14, 2010 @ 90% pre-coaching Complex med regimen--Meds reviewed with pt education and computerized med calendar completed December 03, 2009   Vital Signs:  Patient profile:   77 year old male Weight:      220 pounds O2 Sat:      95 % on Room air Temp:     98.2 degrees F oral Pulse rate:   67 / minute BP sitting:   110 / 72  (left arm)  Vitals Entered By: Vernie Murders (January 14, 2010 9:11 AM)  O2 Flow:  Room air  Physical Exam  Additional Exam:  wt  222  May 31, 2009 >    > 228 October 08, 2009 > 221 November 19, 2009 >>222 December 03, 2009 > 220 January 14, 2010  hoarse pleasant amb wm nad  HEENT: nl dentition,  and orophanx. Nl external ear  canals without cough reflex, mod turbinate edema, non specific NECK :  without JVD/Nodes/TM/ nl carotid upstrokes bilaterally LUNGS: no acc muscle use, clear to A and P bilaterally without cough on insp or exp maneuvers CV:  RRR  no s3 or murmur or increase in P2, no edema  ABD:  soft and nontender with nl excursion in the supine position. No bruits or organomegaly, bowel sounds nl MS:  warm without  deformities, calf tenderness, cyanosis or clubbing      Impression & Recommendations:  Problem # 1:  COUGH (ICD-786.2)  Strong evidence this is chronic non-specific rhinitis with cough variant asthma based on response to empiric rx.  The standardized cough guidelines recently published in Chest are a 14 step process, not a single office visit,  and are intended  to address this problem logically,  with an alogrithm dependent on response to each progressive step  to determine a specific diagnosis with  minimal addtional testing needed. Therefore if compliance is an issue this empiric standardized approach simply won't work.   Thus critical he now stay on maint rx long enough to prove the case and only then very carefully taper off  I spent extra time with the patient today explaining optimal mdi  technique.  This improved from  90-100% after coaching.   Each maintenance medication was reviewed in detail including most importantly the difference between maintenance and as needed and under what circumstances the prns are to be used. This was done in the context of a medication calendar review(I reprinted his previous one)  which provided the patient with a user-friendly unambiguous mechanism for medication administration and reconciliation and provides an action plan for all active problems. It is critical that this be shown to every doctor  for modification during the office visit if necessary so the patient can use it as a working document.   Orders: Est. Patient Level IV (54098)  Medications  Added to Medication List This Visit: 1)  Famotidine 20 Mg Tabs (Famotidine) .Marland Kitchen.. 1 at bedtime 2)  Optive 0.5-0.9 % Soln (Carboxymethylcellul-glycerin) .... As needed  Patient Instructions: 1)  See calendar for specific medication instructions and bring it back for each and every office visit for every healthcare provider you see.  Without it,  you may not receive the best quality medical care that we feel you deserve.  2)  Return to office in 3 months, sooner if needed

## 2010-09-10 NOTE — Assessment & Plan Note (Signed)
Summary: Pulmonary/ ext ov with [facility] teaching   Copy to:  Dr. Lysbeth Galas Primary Provider/Referring Provider:  Dr. Lysbeth Galas  CC:  4 wk followup.  Pt c/o cough x 1 wk- early am cough is prod with yellow sputum.  He also states that he has had slight increase in SOB.  Marland Kitchen  History of Present Illness: 86 yowm quit smoking around 1998 with no resp problems then,stopped due to wife's sensitivity.  May 31, 2009 ov for initial cough started in Fall 2009 eval by Dr Lazarus Salines dx with GERD while on prilosec rec two times a day dosing but no effect.   c/o early am and after supper worse also at hs but does not typically wake him up.  in am cough up < tbsp of clear mucus, otherwise dry. Assoc with nasal congestion worse than usual with occ sneezing.  rec   Prednisone 10 mg 4 each am x 2days, 2x2days, 1x2days and stop  Pepcid ac 20 mg at bedtime Dexilant 60 mg Take  one 30-60 min before first meal of the day   July 04, 2009 1 month followup.  Pt still c/o cough.  States that cough is prod in the am with clear sputum.  He feels that cough gets better during the day and gets worse at night- especially worse when lies down.  back on prilosec at hs and following the diet, feels prednisone worked the best anything has ever worked.  rec singulair and astepro and continue ppi two times a day plus pepcid at bedtime  August 07, 2009 1 month followup.  Pt states that cough has worsened over the past 2 wks.  Cough is prod with clear sptutum.  Pt states the cough bothers him all day and night now.  Most of the mucus is first thing in am. astepro helping nasal discharge. rec Astepro at bedtime  stop  singulair 10 mg one in evening  Pepcid 20 mg one at bedtime Start Chlortrimeton 4 mg one at bedtime but stop it if it bothers your urination.   September 06, 2009 1 month followup.  Pt states that his breathing has been okay.  He feels that he does better while on prednisone.  He c/o prod cough with clear sputum x 2 wks day >  night or early am. rec prednisone and omnaris,  100% better while on prednisone, then caught cold now mucus is yellow in amFebruary 28, 2011 4 wk followup.  Pt c/o cough x 1 wk- early am cough is prod with yellow sputum.  He also states that he has had slight increase in SOB.  Pt denies any significant sore throat, dysphagia, itching, sneezing,  nasal congestion or excess secretions,  fever, chills, sweats, unintended wt loss, pleuritic or exertional cp, hempoptysis, change in activity tolerance  orthopnea pnd or leg swelling Pt also denies any obvious fluctuation in symptoms with weather or environmental change or other alleviating or aggravating factors.       Current Medications (verified): 1)  Diovan 160 Mg Tabs (Valsartan) .Marland Kitchen.. 1 Once Daily 2)  Prilosec Otc 20 Mg Tbec (Omeprazole Magnesium) .... Take One 30-60 Min Before First and Last Meals of The Day 3)  Pepcid 20 Mg Tabs (Famotidine) .... One At Bedtime 4)  Refresh Tears 0.5 % Soln (Carboxymethylcellulose Sodium) .Marland Kitchen.. 1 Drop Each Eye Three Times A Day 5)  Chlor-Trimeton 4 Mg Tabs (Chlorpheniramine Maleate) .Marland Kitchen.. 1 Every 6 Hours As Needed 6)  Omnaris 50 Mcg/act Susp (Ciclesonide) .... One Twice  Daily Each Nostril 7)  Simvastatin 20 Mg Tabs (Simvastatin) .Marland Kitchen.. 1 At Bedtime  Allergies (verified): No Known Drug Allergies  Past History:  Past Medical History: RETINAL DETACHMENT, BILATERAL, HX OF (ICD-V12.49) HYPERTENSION (ICD-401.9) HYPERLIPIDEMIA (ICD-272.4) COUGH.................................................................Marland KitchenWert     - onset 2009     - w/u by [facility] 2010     - Start Singulair July 04, 2009 > no response August 07, 2009  so d/c     - Sinus and Chest CT ordered August 07, 2009 > chronic thickening of sinuses only finding with coronary       calcifications  Vital Signs:  Patient profile:   77 year old male Weight:      228 pounds O2 Sat:      94 % on Room air Temp:     97.9 degrees F oral Pulse rate:   74  / minute BP sitting:   132 / 70  (left arm)  Vitals Entered By: Vernie Murders (October 08, 2009 9:31 AM)  O2 Flow:  Room air  Physical Exam  Additional Exam:  wt  222  May 31, 2009 > 226 July 04, 2009 > 229 August 07, 2009 > 228 September 06, 2009 > 228 October 08, 2009  hoarse pleasant amb wm nad HEENT: nl dentition,  and orophanx. Nl external ear canals without cough reflex, mod turbinate edema, non specific NECK :  without JVD/Nodes/TM/ nl carotid upstrokes bilaterally LUNGS: no acc muscle use, clear to A and P bilaterally without cough on insp or exp maneuvers CV:  RRR  no s3 or murmur or increase in P2, no edema  ABD:  soft and nontender with nl excursion in the supine position. No bruits or organomegaly, bowel sounds nl MS:  warm without deformities, calf tenderness, cyanosis or clubbing      Impression & Recommendations:  Problem # 1:  COUGH (ICD-786.2) The most common causes of chronic cough in immunocompetent adults include: upper airway cough syndrome (UACS), previously referred to as postnasal drip syndrome,  caused by variety of rhinosinus conditions; (2) asthma; (3) GERD; (4) chronic bronchitis from cigarette smoking or other inhaled environmental irritants; (5) nonasthmatic eosinophilic bronchitis; and (6) bronchiectasis. These conditions, singly or in combination, have accounted for up to 94% of the causes of chronic cough in prospective studies.    This cough is impressively responsive to prednisone but not max gerd so ddx is eos rhinitis/sinusitis vs bronchitis which may require fob for eval   I had an extended discussion with the patient and wife  today lasting 15 to 20 minutes of a 25 minute visit on the following issues:   The standardized cough guidelines recently published in Chest are a 14 step process, not a single office visit,  and are intended  to address this problem logically,  with an alogrithm dependent on response to each progressive step  to  determine a specific diagnosis with  minimal addtional testing needed. Therefore if compliance is an issue this empiric standardized approach simply won't work.    I spent extra time with the patient today explaining optimal mdi  technique.  This improved from  25-50 % though immediate cough on insp so asked him to wait until cough responds to prednisone short course as has every time in past.  Problem # 2:  CHRONIC RHINITIS (ICD-472.0) Already eval by [facility], best response to omnaris so far.  Now with purulent sputum in am c/w purulnet pnds or sinusitis despite neg sinus ct recently.  See instructions for specific recommendations   Medications Added to Medication List This Visit: 1)  Simvastatin 20 Mg Tabs (Simvastatin) .Marland Kitchen.. 1 at bedtime 2)  Qvar 40 Mcg/act Aers (Beclomethasone dipropionate) .... 2 puffs first thing  in am and 2 puffs again in pm about 12 hours later 3)  Prednisone 10 Mg Tabs (Prednisone) .... 4 each am x 2 days,  3 x 2days, 2x2 days, and 1x2 days 4)  Augmentin 875-125 Mg Tabs (Amoxicillin-pot clavulanate) .... By mouth twice daily  Other Orders: Est. Patient Level IV (81191)  Patient Instructions: 1)  Only  Take prilosec  one 30-60 min before first meal of the day 2)  stay on omnaris 3)  Augmentin x 10 days 4)  Prednisone 10 mg 4 each am x 2 days,  3 x 2days, 2x2 days, and 1x2 days  5)  Qvar 40 2 puffs first thing  in am and 2 puffs again in pm about 12 hours later- don't start until cough is better 6)  Please schedule a follow-up appointment in 6 weeks, sooner if needed  7)  Work on inhaler technique:  relax and blow all the way out then take a nice smooth deep breath back in, triggering the inhaler at same time you start breathing in and hold for at least a few secs Prescriptions: AUGMENTIN 875-125 MG  TABS (AMOXICILLIN-POT CLAVULANATE) By mouth twice daily  #20 x 0   Entered and Authorized by:   Nyoka Cowden MD   Signed by:   Nyoka Cowden MD on 10/08/2009    Method used:   Print then Give to Patient   RxID:   [id number] PREDNISONE 10 MG  TABS (PREDNISONE) 4 each am x 2 days,  3 x 2days, 2x2 days, and 1x2 days  #20 x 0   Entered and Authorized by:   Nyoka Cowden MD   Signed by:   Nyoka Cowden MD on 10/08/2009   Method used:   Print then Give to Patient   RxID:   [id number]

## 2010-09-12 NOTE — Assessment & Plan Note (Signed)
Summary: Pulmonary/ ext summary f/u ov    Copy to:  Dr. Lysbeth Galas Primary Provider/Referring Provider:  Dr. Lysbeth Galas  CC:  Cough- improved.  History of Present Illness: 89  yowm quit smoking around 1998 with no resp problems then, stopped due to wife's sensitivity.  May 31, 2009 ov for initial cough started in Fall 2009 eval by Dr Lazarus Salines dx with GERD while on prilosec rec two times a day dosing but no effect.   c/o early am and after supper worse also at hs but does not typically wake him up.  in am cough up < tbsp of clear mucus, otherwise dry. Assoc with nasal congestion worse than usual with occ sneezing.  rec   Prednisone 10 mg 4 each am x 2days, 2x2days, 1x2days and stop  Pepcid ac 20 mg at bedtime Dexilant 60 mg Take  one 30-60 min before first meal of the day   July 04, 2009 1 month followup.  Pt still c/o cough.  States that cough is prod in the am with clear sputum.  He feels that cough gets better during the day and gets worse at night- especially worse when lies down.  back on prilosec at hs and following the diet, feels prednisone worked the best anything has ever worked.  rec singulair and astepro and continue ppi two times a day plus pepcid at bedtime  August 07, 2009 1 month followup.  Pt states that cough has worsened over the past 2 wks.  Cough is prod with clear sptutum.  Pt states the cough bothers him all day and night now.  Most of the mucus is first thing in am. astepro helping nasal discharge. rec Astepro at bedtime  stop  singulair 10 mg one in evening  Pepcid 20 mg one at bedtime Start Chlortrimeton 4 mg one at bedtime but stop it if it bothers your urination.   September 06, 2009 1 month followup.  Pt states that his breathing has been okay.  He feels that he does better while on prednisone.  He c/o prod cough with clear sputum x 2 wks day > night or early am. rec prednisone and omnaris,  100% better while on prednisone, then caught cold now mucus is yellow in  amFebruary 28, 2011 4 wk followup.  Pt c/o cough x 1 wk- early am cough is prod with yellow sputum.  He also states that he has had slight increase in SOB.   Did 100% better the month of March after rx with one daily prilosec, omnaris, short course of prednsione and augmentin.rec qvar but did not start it but did well the muonth of March on omnaris and prilosec.  November 19, 2009 Pt here for follow up. Pt c/o productive cough returning 1 1/2 week ago in the AM's with yellow to beige mucus.    December 03, 2009--Presents for follow up and med review. Last visit w/ acute bronchitis/sinusitis. Tx w/ Augemntin and steroid taper. He is feeling much better. Now cough free. He is worried the cough will come back, he has been running in a cough cycle he gets better on abx/steroids then after finishing them cough starts to slowly come back. Currently he is cough free. He did start QVAR last week when cough stopped.   January 14, 2010 6 wk followup.  Pt states that his cough is much better.  Still has some occ cough in the am- mainly dry but sometimes produces some clear sputum. rec no change in rx ,  follow med calendar reviwed  April 22, 2010 3 month followup.  Pt states breathing is the same- no better or worse.  He states that his cough is better since last seen- still has cough on occ "very little"- mainly non prod except for in the am sometimes produces clear sputum. rec use calendar, no change rx.  July 22, 2010 ov cc cough better, nose irritated from flonase but no purulent secretions, no sob. Pt denies any significant sore throat, dysphagia, itching, sneezing,  nasal congestion or excess secretions,  fever, chills, sweats, unintended wt loss, pleuritic or exertional cp, hempoptysis, change in activity tolerance  orthopnea pnd or leg swelling. Pt also denies any obvious fluctuation in symptoms with weather or environmental change or other alleviating or aggravating factors.       Current Medications  (verified): 1)  Diovan 160 Mg Tabs (Valsartan) .Marland Kitchen.. 1 Once Daily 2)  Simvastatin 20 Mg Tabs (Simvastatin) .Marland Kitchen.. 1 At Bedtime 3)  Prilosec Otc 20 Mg Tbec (Omeprazole Magnesium) .... Take 1 Tablet By Mouth Once A Day Before Meal 4)  Fluticasone Propionate 50 Mcg/act Susp (Fluticasone Propionate) .... 2 Puffs Each Nostril Two Times A Day 5)  Qvar 40 Mcg/act  Aers (Beclomethasone Dipropionate) .... 2 Puffs First Thing  in Am and 2 Puffs Again in Pm About 12 Hours Later 6)  Chlor-Trimeton 4 Mg Tabs (Chlorpheniramine Maleate) .Marland Kitchen.. 1 Extra Tab By Mouth Every 4 Hours As Needed 7)  Delsym 30 Mg/44ml Lqcr (Dextromethorphan Polistirex) .... 2 Teaspoons Every 12 Hours As Needed 8)  Saline Nasal Spray 0.65 % Soln (Saline) .... 2 Puffs Every 4 Hours As Needed 9)  Optive 0.5-0.9 % Soln (Carboxymethylcellul-Glycerin) .... As Needed  Allergies (verified): No Known Drug Allergies  Past History:  Past Medical History: RETINAL DETACHMENT, BILATERAL, HX OF (ICD-V12.49) HYPERTENSION (ICD-401.9) HYPERLIPIDEMIA (ICD-272.4) COUGH...................................................................Marland KitchenWert     - onset 2009     - w/u by [facility] 2010     - Start Singulair July 04, 2009 > no response August 07, 2009  so d/c     - Sinus and Chest CT ordered August 07, 2009 > chronic thickening of sinuses only finding with coronary       calcifications      - [facility] January 14, 2010 @ 90% pre-coaching > 100 % April 22, 2010       - Response to qvar 01/2010 Complex med regimen--Meds reviewed with pt education and computerized med calendar completed December 03, 2009 > no longer following July 22, 2010   Vital Signs:  Patient profile:   77 year old male Weight:      226 pounds O2 Sat:      97 % on Room air Temp:     97.5 degrees F oral Pulse rate:   60 / minute BP sitting:   138 / 80  (left arm)  Vitals Entered By: Vernie Murders (July 22, 2010 9:18 AM)  O2 Flow:  Room air  Physical  Exam  Additional Exam:  wt  222  May 31, 2009 > 220 January 14, 2010 > 219 April 22, 2010 > 226 July 23, 2010  very minimall hoarse pleasant amb wm nad  HEENT: nl dentition,  and orophanx. Nl external ear canals without cough reflex, mod turbinate edema, non specific NECK :  without JVD/Nodes/TM/ nl carotid upstrokes bilaterally LUNGS: no acc muscle use, clear to A and P bilaterally without cough on insp or exp maneuvers CV:  RRR  no  s3 or murmur or increase in P2, no edema  ABD:  soft and nontender with nl excursion in the supine position. No bruits or organomegaly, bowel sounds nl MS:  warm without deformities, calf tenderness, cyanosis or clubbing      Impression & Recommendations:  Problem # 1:  COUGH (ICD-786.2)  Strong evidence this is chronic non-specific rhinitis with cough variant asthma based on response to empiric rx.     Thus critical he now stay on maint rx and be very careful about tapering off any of the new meds that appear to have finally controlled his cough    Each maintenance medication was reviewed in detail including most importantly the difference between maintenance and as needed and under what circumstances the prns are to be used. This was done in the context of a medication calendar which he is no longer using consistently; I strongly suggest he do so  Orders: Est. Patient Level IV (16109)  Problem # 2:  CHRONIC RHINITIS (ICD-472.0)  Irritation from flonase, try lower dose with option of using afrin for flare - See instructions for specific recommendations   Orders: Est. Patient Level IV (60454)  Medications Added to Medication List This Visit: 1)  Fluticasone Propionate 50 Mcg/act Susp (Fluticasone propionate) .... One twice daily 2)  Chlor-trimeton 4 Mg Tabs (Chlorpheniramine maleate) .Marland Kitchen.. 1 extra tab by mouth every 4 hours as needed for throat congestion or drainage  Patient Instructions: 1)    Think of your medications in 3 separate  categories and keep them separate:  2)  a)   The ones you take no matter what daily on a scheduled basis 3)  b)   The ones you only take if needed for specific problems 4)  c)    The ones you take for a short course and stop, like antibiotics and prednisone. 5)  I emphasized that nasal steroids(fluticasone)  have no immediate benefit in terms of improving symptoms.  To help them reached the target tissue, the patient should use Afrin two puffs every 12 hours applied one min before using the nasal steroids.  Afrin should be stopped after no more than 5 days.  If the symptoms worsen, Afrin can be restarted after 5 days off of therapy to prevent rebound congestion from overuse of Afrin.  I also emphasized that in no way are nasal steroids a concern in terms of "addiction". 6)  Reduce maint dose of fluticasone to  one two times a day  7)  no pulmonary f/u needed 8)  Refills can be thru Dr Doran Durand office if you're doing well  9)

## 2017-12-15 ENCOUNTER — Ambulatory Visit: Payer: Self-pay | Admitting: Physical Therapy

## 2017-12-17 ENCOUNTER — Encounter: Payer: Self-pay | Admitting: Physical Therapy

## 2017-12-17 ENCOUNTER — Ambulatory Visit: Payer: Medicare Other | Attending: Orthopedic Surgery | Admitting: Physical Therapy

## 2017-12-17 DIAGNOSIS — M6281 Muscle weakness (generalized): Secondary | ICD-10-CM

## 2017-12-17 DIAGNOSIS — M25611 Stiffness of right shoulder, not elsewhere classified: Secondary | ICD-10-CM | POA: Diagnosis not present

## 2017-12-17 DIAGNOSIS — G8929 Other chronic pain: Secondary | ICD-10-CM | POA: Insufficient documentation

## 2017-12-17 DIAGNOSIS — M25511 Pain in right shoulder: Secondary | ICD-10-CM | POA: Diagnosis present

## 2017-12-17 NOTE — Therapy (Signed)
[facility] [place], [place], [address] Phone: [phone]   Fax:  [phone]  Physical Therapy Evaluation  Patient Details  Name: Casey Cooley MRN: [id number] Date of Birth: [birth year] Referring Provider: Remo Lipps Case   Encounter Date: 12/17/2017  PT End of Session - 12/17/17 1733    Visit Number  1    Number of Visits  24    Date for PT Re-Evaluation  03/18/18    Authorization Type  Progress note every 10th visit    PT Start Time  1117    PT Stop Time  1200    PT Time Calculation (min)  43 min    Activity Tolerance  Patient limited by pain    Behavior During Therapy  [facility] for tasks assessed/performed       History reviewed. No pertinent past medical history.  History reviewed. No pertinent surgical history.  There were no vitals filed for this visit.   Subjective Assessment - 12/17/17 1731    Subjective  Patient arrives to physical therapy s/p right RTC repair 10/30/17. Patient reported he fell on his arm and reported endured a massive tear. Patient was unable to recite what the doctor has told him not to do. Patient reports pain currently and at rest is 0/10. Pain with movement can reach "mild to moderate pain." Patient reported heat helps with pain and ice makes it worse. Patient states he is unable to sleep in bed and has been sleeping in a recliner with the abduction sling. Patient reports he has two home health aides that assist him for dressing, ADLs, and home activities. Patient's goals are to decrease pain and improve motion.     Pertinent History  DM    Limitations  Lifting;House hold activities    Diagnostic tests  X-Ray, MRI    Patient Stated Goals  get my arm better    Currently in Pain?  No/denies No pain at rest    Pain Onset  More than a month ago    Pain Frequency  Several days a week    Aggravating Factors   movement, ice    Pain Relieving Factors  rest and heat         [facility] PT Assessment -  12/17/17 0001      Assessment   Medical Diagnosis  R RTC repair    Referring Provider  Remo Lipps Case    Onset Date/Surgical Date  10/30/17    Hand Dominance  Right    Prior Therapy  no      Precautions   Precautions  Shoulder    Type of Shoulder Precautions  RTC repair, see media      Balance Screen   Has the patient fallen in the past 6 months  Yes    How many times?  3    Has the patient had a decrease in activity level because of a fear of falling?   Yes    Is the patient reluctant to leave their home because of a fear of falling?   Yes      [place]  Private residence    Living Arrangements  Spouse/significant other    Available Help at Discharge  Home health      Prior Function   Level of Independence  Needs assistance with ADLs;Needs assistance with homemaking      Observation/Other Assessments   Skin Integrity  6 incisons well healed  Sensation   Light Touch  Appears Intact      ROM / Strength   AROM / PROM / Strength  PROM      PROM   PROM Assessment Site  Shoulder    Right/Left Shoulder  Right    Right Shoulder Flexion  72 Degrees    Right Shoulder ABduction  34 Degrees    Right Shoulder Internal Rotation  42 Degrees    Right Shoulder External Rotation  -10 Degrees      Transfers   Transfers  Supine to Sit;Sit to Supine;Sit to Stand    Sit to Stand  4: Min assist;With upper extremity assist;With armrests    Sit to Stand Details (indicate cue type and reason)  multiple verbal cuing and tactile cuing to prevent use of arm to assist.    Supine to Sit  4: Min assist    Supine to Sit Details (indicate cue type and reason)  verbal and tactile cuing to prevent use of R arm for assisting    Supine to Sit Details  Manual facilitation for weight shifting    Sit to Supine  4: Min assist      Ambulation/Gait   Assistive device  Hemi-walker    Gait Pattern  Step-through pattern;Wide base of support                Objective  measurements completed on examination: See above findings.              PT Education - 12/17/17 1732    Education provided  Yes    Education Details  AP and lateral pendulums, counter top flexion    Person(s) Educated  Patient;Spouse;Caregiver(s)    Methods  Explanation;Demonstration;Handout    Comprehension  Verbalized understanding;Returned demonstration          PT Long Term Goals - 12/17/17 1756      PT LONG TERM GOAL #1   Title  Patient will be independent with HEP    Time  12    Period  Weeks    Status  New      PT LONG TERM GOAL #2   Title  Patient will improve right shoulder flexion AROM to 110 degrees or greater to perform functional activities.    Time  12    Period  Weeks    Status  New      PT LONG TERM GOAL #3   Title  Patient will improve right shoulder flexion AROM to 50+ degrees in order to don/doff apparel.    Time  12    Period  Weeks    Status  New      PT LONG TERM GOAL #4   Title  Patient will improve right shoulder MMT to 4-4+/5 in all planes to improve stability during functional activities.    Time  12    Period  Weeks    Status  New             Plan - 12/17/17 1736    Clinical Impression Statement  Patient is an 84 year old male who presents to physical therapy with right shoulder stiffness, pain, and decreased ROM s/p R RTC repair 10/30/2017. Patient reported pain with PROM and noted with increased muscle guarding near end available range. Patient is a poor historian as he does not recall date of surgery or precautions/protocol he is required to follow. Patient required multiple verbal and tactile cuing to not press up with R arm to  stand or use to assist from sit to supine to maintain integrity of the surgery. Patient reported "there is no other way of getting to do the things I need to do without doing that." Patient's arm was not properly placed in the sling. PT adjusted sling as well as instructed wife and home health aide  proper placement however home health aide reported he is not supposed to wear it except while sleeping. Per doctor's note in medical chart, patient was instructed to use sling at night. Patient noted with poor balance and ambulates with a hemi walker in L hand with poor gait mechanics and safety awareness. Patient would benefit skilled physical therapy to address deficits and address patient goals.     Clinical Presentation  Stable    Clinical Decision Making  Low    Rehab Potential  Fair    Clinical Impairments Affecting Rehab Potential  poor recall of precautions, DM, slow healing process    PT Frequency  2x / week    PT Duration  12 weeks    PT Treatment/Interventions  Electrical Stimulation;Passive range of motion;Manual techniques;ADLs/Self Care Home Management;Iontophoresis 4mg /ml Dexamethasone;Therapeutic activities;Therapeutic exercise;Patient/family education;Moist Heat;Ultrasound    PT Next Visit Plan  Per protocol, see media.    Consulted and Agree with Plan of Care  Patient       Patient will benefit from skilled therapeutic intervention in order to improve the following deficits and impairments:  Pain, Decreased activity tolerance, Decreased endurance, Decreased range of motion, Decreased strength, Impaired UE functional use, Decreased knowledge of precautions  Visit Diagnosis: Stiffness of right shoulder, not elsewhere classified  Chronic right shoulder pain  Muscle weakness (generalized)     Problem List Patient Active Problem List   Diagnosis Date Noted  . CHRONIC RHINITIS 10/08/2009  . HYPERLIPIDEMIA 05/31/2009  . HYPERTENSION 05/31/2009  . COUGH 05/31/2009  . RETINAL DETACHMENT, BILATERAL, HX OF 05/31/2009    Gabriela Eves, PT, DPT 12/17/2017, 9:18 PM  [facility] [address] [place], [place], [address] Phone: [phone]   Fax:  [phone]  Name: Casey Cooley MRN: [id number] Date of Birth: 1933-01-04

## 2017-12-21 ENCOUNTER — Ambulatory Visit: Payer: Medicare Other | Admitting: Physical Therapy

## 2017-12-21 DIAGNOSIS — M25611 Stiffness of right shoulder, not elsewhere classified: Secondary | ICD-10-CM | POA: Diagnosis not present

## 2017-12-21 DIAGNOSIS — G8929 Other chronic pain: Secondary | ICD-10-CM

## 2017-12-21 DIAGNOSIS — M6281 Muscle weakness (generalized): Secondary | ICD-10-CM

## 2017-12-21 DIAGNOSIS — M25511 Pain in right shoulder: Secondary | ICD-10-CM

## 2017-12-21 NOTE — Therapy (Signed)
[facility] [place], [place], [address] Phone: [phone]   Fax:  [phone]  Physical Therapy Treatment  Patient Details  Name: Casey Cooley MRN: [id number] Date of Birth: Feb 24, 1933 Referring Provider: Remo Lipps Case   Encounter Date: 12/21/2017  PT End of Session - 12/21/17 1122    Visit Number  2    Number of Visits  24    Date for PT Re-Evaluation  03/18/18    Authorization Type  Progress note every 10th visit    PT Start Time  1120    PT Stop Time  1210    PT Time Calculation (min)  50 min    Activity Tolerance  Patient limited by pain    Behavior During Therapy  [facility] for tasks assessed/performed       No past medical history on file.  No past surgical history on file.  There were no vitals filed for this visit.  Subjective Assessment - 12/21/17 1123    Subjective  Patient reports only pain is in the anterior aspect of the shoulder and reports he hasn't been doing any exercises.    Pertinent History  DM    Limitations  Lifting;House hold activities    Diagnostic tests  X-Ray, MRI    Patient Stated Goals  get my arm better    Currently in Pain?  Yes    Pain Score  3     Pain Location  Shoulder    Pain Orientation  Right    Pain Descriptors / Indicators  Sore;Aching    Pain Type  Surgical pain    Pain Onset  More than a month ago         [facility] PT Assessment - 12/21/17 0001      Assessment   Medical Diagnosis  R RTC repair    Onset Date/Surgical Date  10/30/17    Hand Dominance  Right    Prior Therapy  no      Precautions   Precautions  Shoulder    Type of Shoulder Precautions  RTC repair, see media                   [facility] Adult PT Treatment/Exercise - 12/21/17 0001      Exercises   Exercises  Shoulder;Hand      Shoulder Exercises: Supine   External Rotation  AAROM;20 reps with PVC pipe    Other Supine Exercises  AROM elbow flexion x20      Shoulder Exercises: Isometric  Strengthening   External Rotation  Other (comment);Limitations;Supine    External Rotation Limitations  in supine; 10 x2" hold against PT resistance    Internal Rotation  Supine;Limitations;Other (comment)    Internal Rotation Limitations  10x 2" hold against PT resistance      Hand Exercises   Other Hand Exercises  yellow gripper 3x10      Modalities   Modalities  Electrical Stimulation;Moist Heat      Moist Heat Therapy   Number Minutes Moist Heat  10 Minutes    Moist Heat Location  Shoulder      Electrical Stimulation   Electrical Stimulation Location  right shoulder    Electrical Stimulation Action  IFC    Electrical Stimulation Parameters  80-150 hz x10    Electrical Stimulation Goals  Pain      Manual Therapy   Manual Therapy  Passive ROM    Passive ROM  Gentle flexion and ER PROM to increase  ROM with oscillations to promote muscle relaxation                  PT Long Term Goals - 12/17/17 1756      PT LONG TERM GOAL #1   Title  Patient will be independent with HEP    Time  12    Period  Weeks    Status  New      PT LONG TERM GOAL #2   Title  Patient will improve right shoulder flexion AROM to 110 degrees or greater to perform functional activities.    Time  12    Period  Weeks    Status  New      PT LONG TERM GOAL #3   Title  Patient will improve right shoulder flexion AROM to 50+ degrees in order to don/doff apparel.    Time  12    Period  Weeks    Status  New      PT LONG TERM GOAL #4   Title  Patient will improve right shoulder MMT to 4-4+/5 in all planes to improve stability during functional activities.    Time  12    Period  Weeks    Status  New            Plan - 12/21/17 1132    Clinical Impression Statement  Patient was able to tolerate treatment with multiple cuing for proper form. Patient noted required multiple verbal cuing and oscillations for muscle relaxation during PROM. Patient noted with increased muscle stiffness with ER  with associated pain at available end range. Patient was educated to perform HEP daily to prevent loss of motion. Patient was also instructed to not use R arm to push up while in lift chair. Pt instructed to raise lift chair to a level where he can stand without UE support. Patient reported understanding.  Normal response to modalities upon removal. Patient required min-mod A with verbal curing to prevent WB on R UE for supine to sit.     Clinical Presentation  Stable    Clinical Decision Making  Low    Rehab Potential  Fair    Clinical Impairments Affecting Rehab Potential  poor recall of precautions, DM, slow healing process    PT Frequency  2x / week    PT Duration  12 weeks    PT Treatment/Interventions  Electrical Stimulation;Passive range of motion;Manual techniques;ADLs/Self Care Home Management;Iontophoresis 4mg /ml Dexamethasone;Therapeutic activities;Therapeutic exercise;Patient/family education;Moist Heat;Ultrasound    PT Next Visit Plan  Per protocol, see media. MHP and e-stim for pain relief.    Consulted and Agree with Plan of Care  Patient       Patient will benefit from skilled therapeutic intervention in order to improve the following deficits and impairments:  Pain, Decreased activity tolerance, Decreased endurance, Decreased range of motion, Decreased strength, Impaired UE functional use, Decreased knowledge of precautions  Visit Diagnosis: Stiffness of right shoulder, not elsewhere classified  Chronic right shoulder pain  Muscle weakness (generalized)     Problem List Patient Active Problem List   Diagnosis Date Noted  . CHRONIC RHINITIS 10/08/2009  . HYPERLIPIDEMIA 05/31/2009  . HYPERTENSION 05/31/2009  . COUGH 05/31/2009  . RETINAL DETACHMENT, BILATERAL, HX OF 05/31/2009    Gabriela Eves 12/21/2017, 12:20 PM  [facility] [address] [place], [place], [address] Phone: [phone]   Fax:  [phone]  Name:  Casey Cooley MRN: [id number] Date of Birth: 08-01-1933

## 2017-12-24 ENCOUNTER — Encounter: Payer: Self-pay | Admitting: Physical Therapy

## 2017-12-24 ENCOUNTER — Ambulatory Visit: Payer: Medicare Other | Admitting: Physical Therapy

## 2017-12-24 DIAGNOSIS — M25611 Stiffness of right shoulder, not elsewhere classified: Secondary | ICD-10-CM | POA: Diagnosis not present

## 2017-12-24 DIAGNOSIS — M6281 Muscle weakness (generalized): Secondary | ICD-10-CM

## 2017-12-24 DIAGNOSIS — M25511 Pain in right shoulder: Secondary | ICD-10-CM

## 2017-12-24 DIAGNOSIS — G8929 Other chronic pain: Secondary | ICD-10-CM

## 2017-12-24 NOTE — Therapy (Signed)
[facility] [place], [place], [address] Phone: [phone]   Fax:  [phone]  Physical Therapy Treatment  Patient Details  Name: Casey Cooley MRN: [id number] Date of Birth: Jan 01, 1933 Referring Provider: Remo Lipps Case   Encounter Date: 12/24/2017  PT End of Session - 12/24/17 1116    Visit Number  3    Number of Visits  24    Date for PT Re-Evaluation  03/18/18    Authorization Type  Progress note every 10th visit    PT Start Time  1116    PT Stop Time  1201    PT Time Calculation (min)  45 min    Activity Tolerance  Patient limited by pain    Behavior During Therapy  [facility] for tasks assessed/performed       History reviewed. No pertinent past medical history.  History reviewed. No pertinent surgical history.  There were no vitals filed for this visit.  Subjective Assessment - 12/24/17 1114    Subjective  Reports sleeping/living in his lift chair at home.     Pertinent History  DM    Limitations  Lifting;House hold activities    Diagnostic tests  X-Ray, MRI    Patient Stated Goals  get my arm better    Currently in Pain?  No/denies         [facility] PT Assessment - 12/24/17 0001      Assessment   Medical Diagnosis  R RTC repair    Onset Date/Surgical Date  10/30/17    Hand Dominance  Right    Prior Therapy  no      Precautions   Precautions  Shoulder    Type of Shoulder Precautions  RTC repair, see media                   [facility] Adult PT Treatment/Exercise - 12/24/17 0001      Transfers   Transfers  Supine to Sit;Sit to Supine    Sit to Stand  --    Supine to Sit  4: Min assist    Sit to Supine  4: Min assist      Shoulder Exercises: Supine   External Rotation  AAROM;20 reps    Other Supine Exercises  AROM elbow flexion x20      Shoulder Exercises: Isometric Strengthening   External Rotation  Other (comment);Limitations;Supine    External Rotation Limitations  in supine; 10 x2" hold  against PT resistance    Internal Rotation  Supine;Limitations;Other (comment)    Internal Rotation Limitations  10x 2" hold against PT resistance      Modalities   Modalities  Electrical Stimulation;Moist Heat      Moist Heat Therapy   Number Minutes Moist Heat  15 Minutes    Moist Heat Location  Shoulder      Electrical Stimulation   Electrical Stimulation Location  R shoulder    Electrical Stimulation Action  Pre-Mod    Electrical Stimulation Parameters  80-150 hz x15 min    Electrical Stimulation Goals  Pain      Manual Therapy   Manual Therapy  Passive ROM    Passive ROM  Gentle PROM of R shoulder into flexion, ER with gentle holds at end range                  PT Long Term Goals - 12/17/17 1756      PT LONG TERM GOAL #1   Title  Patient  will be independent with HEP    Time  12    Period  Weeks    Status  New      PT LONG TERM GOAL #2   Title  Patient will improve right shoulder flexion AROM to 110 degrees or greater to perform functional activities.    Time  12    Period  Weeks    Status  New      PT LONG TERM GOAL #3   Title  Patient will improve right shoulder flexion AROM to 50+ degrees in order to don/doff apparel.    Time  12    Period  Weeks    Status  New      PT LONG TERM GOAL #4   Title  Patient will improve right shoulder MMT to 4-4+/5 in all planes to improve stability during functional activities.    Time  12    Period  Weeks    Status  New            Plan - 12/24/17 1154    Clinical Impression Statement  Patient presented in clinic without sling donned and with use of hemiwalker for ambulation. Patient required tactile cueing and verbal cueing for proper exercise technique. Patient reported intermittant discomfort in R shoulder with exercises as well as PROM. Frequent VCs for relaxation as well as frequent oscillations required to promote relaxation. Patient encouraged to not utilize RUE to push up from chairs and required min  assist to transition from sit <> supine. Normal modalities response noted following removal of the modalities.    Rehab Potential  Fair    Clinical Impairments Affecting Rehab Potential  poor recall of precautions, DM, slow healing process    PT Frequency  2x / week    PT Duration  12 weeks    PT Treatment/Interventions  Electrical Stimulation;Passive range of motion;Manual techniques;ADLs/Self Care Home Management;Iontophoresis 4mg /ml Dexamethasone;Therapeutic activities;Therapeutic exercise;Patient/family education;Moist Heat;Ultrasound    PT Next Visit Plan  Per protocol, see media. MHP and e-stim for pain relief.    Consulted and Agree with Plan of Care  Patient       Patient will benefit from skilled therapeutic intervention in order to improve the following deficits and impairments:  Pain, Decreased activity tolerance, Decreased endurance, Decreased range of motion, Decreased strength, Impaired UE functional use, Decreased knowledge of precautions  Visit Diagnosis: Stiffness of right shoulder, not elsewhere classified  Chronic right shoulder pain  Muscle weakness (generalized)     Problem List Patient Active Problem List   Diagnosis Date Noted  . CHRONIC RHINITIS 10/08/2009  . HYPERLIPIDEMIA 05/31/2009  . HYPERTENSION 05/31/2009  . COUGH 05/31/2009  . RETINAL DETACHMENT, BILATERAL, HX OF 05/31/2009    Standley Brooking, PTA 12/24/2017, 12:09 PM  [facility] [address] [place], [place], [address] Phone: [phone]   Fax:  [phone]  Name: Casey Cooley MRN: [id number] Date of Birth: [birth year]

## 2017-12-28 ENCOUNTER — Ambulatory Visit: Payer: Medicare Other | Admitting: Physical Therapy

## 2017-12-28 DIAGNOSIS — M25611 Stiffness of right shoulder, not elsewhere classified: Secondary | ICD-10-CM

## 2017-12-28 DIAGNOSIS — M25511 Pain in right shoulder: Secondary | ICD-10-CM

## 2017-12-28 DIAGNOSIS — M6281 Muscle weakness (generalized): Secondary | ICD-10-CM

## 2017-12-28 DIAGNOSIS — G8929 Other chronic pain: Secondary | ICD-10-CM

## 2017-12-28 NOTE — Therapy (Signed)
[facility] [place], [place], [address] Phone: [phone]   Fax:  [phone]  Physical Therapy Treatment  Patient Details  Name: Casey Cooley MRN: [id number] Date of Birth: 07/02/1933 Referring Provider: Remo Lipps Case   Encounter Date: 12/28/2017  PT End of Session - 12/28/17 1123    Visit Number  4    Number of Visits  24    Date for PT Re-Evaluation  03/18/18    Authorization Type  Progress note every 10th visit    PT Start Time  1118    PT Stop Time  1213    PT Time Calculation (min)  55 min    Activity Tolerance  Patient limited by pain    Behavior During Therapy  [facility] for tasks assessed/performed       No past medical history on file.  No past surgical history on file.  There were no vitals filed for this visit.  Subjective Assessment - 12/28/17 1159    Subjective  Patient reported no new complaints.     Pertinent History  DM    Limitations  Lifting;House hold activities    Diagnostic tests  X-Ray, MRI    Patient Stated Goals  get my arm better    Currently in Pain?  No/denies         [facility] PT Assessment - 12/28/17 0001      Assessment   Medical Diagnosis  R RTC repair    Onset Date/Surgical Date  10/30/17    Hand Dominance  Right    Prior Therapy  no      Precautions   Precautions  Shoulder    Type of Shoulder Precautions  RTC repair, see media                   [facility] Adult PT Treatment/Exercise - 12/28/17 0001      Transfers   Transfers  Supine to Sit;Sit to Supine    Supine to Sit  4: Min assist    Sit to Supine  4: Min assist      Exercises   Exercises  Shoulder;Hand      Shoulder Exercises: Supine   External Rotation  AAROM;20 reps reclined    Other Supine Exercises  AROM elbow flexion x20      Shoulder Exercises: Isometric Strengthening   External Rotation  Other (comment);Limitations;Supine    External Rotation Limitations  in supine; 10 x2" hold against PT  resistance    Internal Rotation  Supine;Limitations;Other (comment)    Internal Rotation Limitations  10x 2" hold against PT resistance      Modalities   Modalities  Electrical Stimulation;Moist Heat      Moist Heat Therapy   Number Minutes Moist Heat  15 Minutes    Moist Heat Location  Shoulder      Electrical Stimulation   Electrical Stimulation Location  R shoulder    Electrical Stimulation Action  IFC    Electrical Stimulation Parameters  80-150 hz x 15    Electrical Stimulation Goals  Pain      Manual Therapy   Manual Therapy  Passive ROM    Passive ROM  Gentle PROM of R shoulder into flexion, ER with gentle holds at end range                  PT Long Term Goals - 12/17/17 1756      PT LONG TERM GOAL #1   Title  Patient  will be independent with HEP    Time  12    Period  Weeks    Status  New      PT LONG TERM GOAL #2   Title  Patient will improve right shoulder flexion AROM to 110 degrees or greater to perform functional activities.    Time  12    Period  Weeks    Status  New      PT LONG TERM GOAL #3   Title  Patient will improve right shoulder flexion AROM to 50+ degrees in order to don/doff apparel.    Time  12    Period  Weeks    Status  New      PT LONG TERM GOAL #4   Title  Patient will improve right shoulder MMT to 4-4+/5 in all planes to improve stability during functional activities.    Time  12    Period  Weeks    Status  New            Plan - 12/28/17 1202    Clinical Impression Statement  Patient was able to tolerate treatment fairly. Patient limited with pain in R shoulder and deltoid insertion. Patient requires multiple cuing and demonstration for proper form. Patient required intermittent oscillations and verbal cuing to prevent muscle guarding during PROM. Normal response to modalities at end of session. Patient continues to require verbal cuing and tactile cuing to prevent use of R UE to transition from various positions.     Clinical Presentation  Stable    Clinical Decision Making  Low    Rehab Potential  Fair    Clinical Impairments Affecting Rehab Potential  poor recall of precautions, DM, slow healing process    PT Frequency  2x / week    PT Duration  12 weeks    PT Treatment/Interventions  Electrical Stimulation;Passive range of motion;Manual techniques;ADLs/Self Care Home Management;Iontophoresis 4mg /ml Dexamethasone;Therapeutic activities;Therapeutic exercise;Patient/family education;Moist Heat;Ultrasound    PT Next Visit Plan  Per protocol, see media. MHP and e-stim for pain relief.    Consulted and Agree with Plan of Care  Patient       Patient will benefit from skilled therapeutic intervention in order to improve the following deficits and impairments:  Pain, Decreased activity tolerance, Decreased endurance, Decreased range of motion, Decreased strength, Impaired UE functional use, Decreased knowledge of precautions  Visit Diagnosis: Stiffness of right shoulder, not elsewhere classified  Chronic right shoulder pain  Muscle weakness (generalized)     Problem List Patient Active Problem List   Diagnosis Date Noted  . CHRONIC RHINITIS 10/08/2009  . HYPERLIPIDEMIA 05/31/2009  . HYPERTENSION 05/31/2009  . COUGH 05/31/2009  . RETINAL DETACHMENT, BILATERAL, HX OF 05/31/2009    Gabriela Eves, PT, DPT 12/28/2017, 12:21 PM  [facility] [address] [place], [place], [address] Phone: [phone]   Fax:  [phone]  Name: NAHOM CARFAGNO MRN: [id number] Date of Birth: 1933/06/19

## 2017-12-31 ENCOUNTER — Ambulatory Visit: Payer: Medicare Other | Admitting: Physical Therapy

## 2017-12-31 DIAGNOSIS — M25511 Pain in right shoulder: Secondary | ICD-10-CM

## 2017-12-31 DIAGNOSIS — M6281 Muscle weakness (generalized): Secondary | ICD-10-CM

## 2017-12-31 DIAGNOSIS — G8929 Other chronic pain: Secondary | ICD-10-CM

## 2017-12-31 DIAGNOSIS — M25611 Stiffness of right shoulder, not elsewhere classified: Secondary | ICD-10-CM

## 2017-12-31 NOTE — Therapy (Signed)
[facility] [place], [place], [address] Phone: [phone]   Fax:  [phone]  Physical Therapy Treatment  Patient Details  Name: Casey Cooley MRN: [id number] Date of Birth: 1933-07-31 Referring Provider: Remo Lipps Case   Encounter Date: 12/31/2017  PT End of Session - 12/31/17 1038    Visit Number  5    Number of Visits  24    Date for PT Re-Evaluation  03/18/18    Authorization Type  Progress note every 10th visit    PT Start Time  1033    PT Stop Time  1122    PT Time Calculation (min)  49 min    Activity Tolerance  Patient limited by pain    Behavior During Therapy  [facility] for tasks assessed/performed       No past medical history on file.  No past surgical history on file.  There were no vitals filed for this visit.  Subjective Assessment - 12/31/17 1038    Subjective  "My shoulder is feeling good today."    Pertinent History  DM    Limitations  Lifting;House hold activities    Diagnostic tests  X-Ray, MRI    Patient Stated Goals  get my arm better    Currently in Pain?  No/denies         [facility] PT Assessment - 12/31/17 0001      Assessment   Medical Diagnosis  R RTC repair                   [facility] Adult PT Treatment/Exercise - 12/31/17 0001      Transfers   Transfers  Supine to Sit;Sit to Supine    Sit to Stand  4: Min guard      Exercises   Exercises  Shoulder;Hand      Shoulder Exercises: Supine   External Rotation  AAROM;20 reps reclined seat    Other Supine Exercises  AROM elbow flexion 3x10      Shoulder Exercises: Isometric Strengthening   External Rotation  Supine;5X5" reclined seat    Internal Rotation  Supine;5X5" in reclined  seat      Modalities   Modalities  Electrical Stimulation;Moist Heat      Moist Heat Therapy   Number Minutes Moist Heat  15 Minutes    Moist Heat Location  Shoulder      Electrical Stimulation   Electrical Stimulation Location  R shoulder     Electrical Stimulation Action  IFC    Electrical Stimulation Parameters  80-150 hz x15 Pain      Manual Therapy   Manual Therapy  Passive ROM    Passive ROM  Gentle PROM of R shoulder into flexion, ER with gentle holds at end range                  PT Long Term Goals - 12/17/17 1756      PT LONG TERM GOAL #1   Title  Patient will be independent with HEP    Time  12    Period  Weeks    Status  New      PT LONG TERM GOAL #2   Title  Patient will improve right shoulder flexion AROM to 110 degrees or greater to perform functional activities.    Time  12    Period  Weeks    Status  New      PT LONG TERM GOAL #3   Title  Patient  will improve right shoulder flexion AROM to 50+ degrees in order to don/doff apparel.    Time  12    Period  Weeks    Status  New      PT LONG TERM GOAL #4   Title  Patient will improve right shoulder MMT to 4-4+/5 in all planes to improve stability during functional activities.    Time  12    Period  Weeks    Status  New            Plan - 12/31/17 1125    Clinical Impression Statement  Patient continues to have shoulder PROM deficits in flexion and ER. Patient required multiple oscillations throughout PROM to prevent muscle guarding. Patient was observed attempting to use R UE to push up from sit to stand at end of session and despite multiple attempts to educate protocol since initial evaluation, patient did not know he was unable to press up with his affected UE. Normal response to modalities upon removal.    Clinical Presentation  Stable    Clinical Decision Making  Low    Rehab Potential  Fair    Clinical Impairments Affecting Rehab Potential  poor recall of precautions, DM, slow healing process    PT Frequency  2x / week    PT Duration  12 weeks    PT Treatment/Interventions  Electrical Stimulation;Passive range of motion;Manual techniques;ADLs/Self Care Home Management;Iontophoresis 4mg /ml Dexamethasone;Therapeutic  activities;Therapeutic exercise;Patient/family education;Moist Heat;Ultrasound    PT Next Visit Plan  Per protocol, see media. MHP and e-stim for pain relief.    Consulted and Agree with Plan of Care  Patient       Patient will benefit from skilled therapeutic intervention in order to improve the following deficits and impairments:  Pain, Decreased activity tolerance, Decreased endurance, Decreased range of motion, Decreased strength, Impaired UE functional use, Decreased knowledge of precautions  Visit Diagnosis: Stiffness of right shoulder, not elsewhere classified  Chronic right shoulder pain  Muscle weakness (generalized)     Problem List Patient Active Problem List   Diagnosis Date Noted  . CHRONIC RHINITIS 10/08/2009  . HYPERLIPIDEMIA 05/31/2009  . HYPERTENSION 05/31/2009  . COUGH 05/31/2009  . RETINAL DETACHMENT, BILATERAL, HX OF 05/31/2009    Gabriela Eves, PT, DPT 12/31/2017, 11:33 AM  [facility] [address] [place], [place], [address] Phone: [phone]   Fax:  [phone]  Name: Casey Cooley MRN: [id number] Date of Birth: Nov 07, 1932

## 2018-01-07 ENCOUNTER — Encounter: Payer: Self-pay | Admitting: Physical Therapy

## 2018-01-07 ENCOUNTER — Ambulatory Visit: Payer: Medicare Other | Admitting: Physical Therapy

## 2018-01-07 DIAGNOSIS — M25611 Stiffness of right shoulder, not elsewhere classified: Secondary | ICD-10-CM | POA: Diagnosis not present

## 2018-01-07 DIAGNOSIS — G8929 Other chronic pain: Secondary | ICD-10-CM

## 2018-01-07 DIAGNOSIS — M6281 Muscle weakness (generalized): Secondary | ICD-10-CM

## 2018-01-07 DIAGNOSIS — M25511 Pain in right shoulder: Secondary | ICD-10-CM

## 2018-01-07 NOTE — Therapy (Signed)
[facility] [place], [place], [address] Phone: [phone]   Fax:  [phone]  Physical Therapy Treatment  Patient Details  Name: Casey Cooley MRN: [id number] Date of Birth: [birth year] Referring Provider: Remo Lipps Case   Encounter Date: 01/07/2018  PT End of Session - 01/07/18 1119    Visit Number  6    Number of Visits  24    Date for PT Re-Evaluation  03/18/18    Authorization Type  Progress note every 10th visit    PT Start Time  1119    PT Stop Time  1205    PT Time Calculation (min)  46 min    Activity Tolerance  Patient tolerated treatment well    Behavior During Therapy  [facility] for tasks assessed/performed       History reviewed. No pertinent past medical history.  History reviewed. No pertinent surgical history.  There were no vitals filed for this visit.  Subjective Assessment - 01/07/18 1112    Subjective  Reports that his shoulder is okay but he is having LBP secondary to sleeping in his lift chair for 3 months.    Pertinent History  DM    Limitations  Lifting;House hold activities    Diagnostic tests  X-Ray, MRI    Patient Stated Goals  get my arm better    Currently in Pain?  No/denies         [facility] PT Assessment - 01/07/18 0001      Assessment   Medical Diagnosis  R RTC repair    Onset Date/Surgical Date  10/30/17    Hand Dominance  Right    Prior Therapy  no      Precautions   Precautions  Shoulder    Type of Shoulder Precautions  RTC repair, see media                   [facility] Adult PT Treatment/Exercise - 01/07/18 0001      Elbow Exercises   Elbow Flexion  AROM;Right;20 reps;Supine      Shoulder Exercises: Supine   External Rotation  AAROM;20 reps      Shoulder Exercises: Isometric Strengthening   External Rotation  Supine;Other (comment) 20 reps x2 sec    Internal Rotation  Supine;Other (comment) x20 reps with 2 sec hold      Modalities   Modalities  Electrical  Stimulation;Moist Heat      Moist Heat Therapy   Number Minutes Moist Heat  15 Minutes    Moist Heat Location  Shoulder      Electrical Stimulation   Electrical Stimulation Location  R shoulder    Electrical Stimulation Action  IFC    Electrical Stimulation Parameters  1-10 hz x15 min    Electrical Stimulation Goals  Pain      Manual Therapy   Manual Therapy  Passive ROM    Passive ROM  Gentle PROM of R shoulder into flexion, ER with gentle holds at end range                  PT Long Term Goals - 12/17/17 1756      PT LONG TERM GOAL #1   Title  Patient will be independent with HEP    Time  12    Period  Weeks    Status  New      PT LONG TERM GOAL #2   Title  Patient will improve right shoulder flexion AROM to 110  degrees or greater to perform functional activities.    Time  12    Period  Weeks    Status  New      PT LONG TERM GOAL #3   Title  Patient will improve right shoulder flexion AROM to 50+ degrees in order to don/doff apparel.    Time  12    Period  Weeks    Status  New      PT LONG TERM GOAL #4   Title  Patient will improve right shoulder MMT to 4-4+/5 in all planes to improve stability during functional activities.    Time  12    Period  Weeks    Status  New            Plan - 01/07/18 1154    Clinical Impression Statement  Patient continues to tolerate treatment fairly well as he reports intermittant pain in posterior R shoulder. Patient required mod VCs and tactile cues to correct technique with therex. Gentle PROM of R shoulder continued with limitations in both flexion and ER. Normal modalities response noted following removal of the modalities.    Rehab Potential  Fair    Clinical Impairments Affecting Rehab Potential  poor recall of precautions, DM, slow healing process    PT Frequency  2x / week    PT Duration  12 weeks    PT Treatment/Interventions  Electrical Stimulation;Passive range of motion;Manual techniques;ADLs/Self Care Home  Management;Iontophoresis 4mg /ml Dexamethasone;Therapeutic activities;Therapeutic exercise;Patient/family education;Moist Heat;Ultrasound    PT Next Visit Plan  Per protocol, see media. MHP and e-stim for pain relief.    Consulted and Agree with Plan of Care  Patient       Patient will benefit from skilled therapeutic intervention in order to improve the following deficits and impairments:  Pain, Decreased activity tolerance, Decreased endurance, Decreased range of motion, Decreased strength, Impaired UE functional use, Decreased knowledge of precautions  Visit Diagnosis: Stiffness of right shoulder, not elsewhere classified  Chronic right shoulder pain  Muscle weakness (generalized)     Problem List Patient Active Problem List   Diagnosis Date Noted  . CHRONIC RHINITIS 10/08/2009  . HYPERLIPIDEMIA 05/31/2009  . HYPERTENSION 05/31/2009  . COUGH 05/31/2009  . RETINAL DETACHMENT, BILATERAL, HX OF 05/31/2009    Standley Brooking, PTA 01/07/2018, 12:07 PM  [facility] [place], [place], [address] Phone: [phone]   Fax:  [phone]  Name: Casey Cooley MRN: [id number] Date of Birth: Aug 03, 1933

## 2018-01-11 ENCOUNTER — Encounter: Payer: Self-pay | Admitting: Physical Therapy

## 2018-01-11 ENCOUNTER — Ambulatory Visit: Payer: Medicare Other | Attending: Orthopedic Surgery | Admitting: Physical Therapy

## 2018-01-11 DIAGNOSIS — M25611 Stiffness of right shoulder, not elsewhere classified: Secondary | ICD-10-CM | POA: Insufficient documentation

## 2018-01-11 DIAGNOSIS — M6281 Muscle weakness (generalized): Secondary | ICD-10-CM | POA: Insufficient documentation

## 2018-01-11 DIAGNOSIS — M25511 Pain in right shoulder: Secondary | ICD-10-CM | POA: Insufficient documentation

## 2018-01-11 DIAGNOSIS — G8929 Other chronic pain: Secondary | ICD-10-CM | POA: Insufficient documentation

## 2018-01-11 NOTE — Therapy (Signed)
[facility] [place], [place], [address] Phone: [phone]   Fax:  [phone]  Physical Therapy Treatment  Patient Details  Name: Casey Cooley MRN: [id number] Date of Birth: Feb 19, 1933 Referring Provider: Remo Lipps Case   Encounter Date: 01/11/2018  PT End of Session - 01/11/18 1120    Visit Number  7    Number of Visits  24    Date for PT Re-Evaluation  03/18/18    Authorization Type  Progress note every 10th visit    PT Start Time  1115    PT Stop Time  1204    PT Time Calculation (min)  49 min    Activity Tolerance  Patient tolerated treatment well    Behavior During Therapy  [facility] for tasks assessed/performed       History reviewed. No pertinent past medical history.  History reviewed. No pertinent surgical history.  There were no vitals filed for this visit.      [facility] PT Assessment - 01/11/18 0001      Assessment   Medical Diagnosis  R RTC repair    Onset Date/Surgical Date  10/30/17    Hand Dominance  Right    Prior Therapy  no      Precautions   Precautions  Shoulder    Type of Shoulder Precautions  RTC repair, see media                   [facility] Adult PT Treatment/Exercise - 01/11/18 0001      Elbow Exercises   Elbow Flexion  AROM;Right;20 reps;Supine      Shoulder Exercises: Supine   External Rotation  AAROM;20 reps with 5 second hold      Shoulder Exercises: Isometric Strengthening   Flexion  Supine x10, 2 second hold    External Rotation  Supine;Other (comment) x20 2 second hold    Internal Rotation  Supine;Other (comment) x20, 2 second hold      Modalities   Modalities  Electrical Stimulation;Moist Research officer, political party Location  R shoulder    Electrical Stimulation Action  IFC    Electrical Stimulation Parameters  1-10 hz x15 min    Electrical Stimulation Goals  Pain      Manual Therapy   Manual Therapy  Passive ROM    Passive ROM   Gentle PROM of R shoulder into flexion, ER with gentle holds at end range                  PT Long Term Goals - 01/11/18 1153      PT LONG TERM GOAL #1   Title  Patient will be independent with HEP    Time  12    Period  Weeks    Status  On-going still in PROM phase      PT LONG TERM GOAL #2   Title  Patient will improve right shoulder flexion AROM to 110 degrees or greater to perform functional activities.    Time  12    Period  Weeks    Status  On-going still in PROM phase      PT LONG TERM GOAL #3   Title  Patient will improve right shoulder ER AROM to 50+ degrees in order to don/doff apparel.    Time  12    Period  Weeks    Status  On-going Still in PROM phase      PT  LONG TERM GOAL #4   Title  Patient will improve right shoulder MMT to 4-4+/5 in all planes to improve stability during functional activities.    Time  12    Period  Weeks    Status  On-going            Plan - 01/11/18 1147    Clinical Impression Statement  Patient was able to tolerate treatment faily. Patient continues to resist DPT during PROM ER despite intermittent oscillations to promote relaxation. Patient continues to attempt the use of R UE during sit to stand and sit to reclined supine despite providing VC and tactile cue prior to task. Normal response to modalities upon removal.    Clinical Presentation  Stable    Clinical Decision Making  Low    Rehab Potential  Fair    Clinical Impairments Affecting Rehab Potential  poor recall of precautions, DM, slow healing process    PT Frequency  2x / week    PT Duration  12 weeks    PT Treatment/Interventions  Electrical Stimulation;Passive range of motion;Manual techniques;ADLs/Self Care Home Management;Iontophoresis 4mg /ml Dexamethasone;Therapeutic activities;Therapeutic exercise;Patient/family education;Moist Heat;Ultrasound    PT Next Visit Plan  Per protocol, see media. MHP and e-stim for pain relief.    Consulted and Agree with Plan of  Care  Patient       Patient will benefit from skilled therapeutic intervention in order to improve the following deficits and impairments:  Pain, Decreased activity tolerance, Decreased endurance, Decreased range of motion, Decreased strength, Impaired UE functional use, Decreased knowledge of precautions  Visit Diagnosis: Stiffness of right shoulder, not elsewhere classified  Chronic right shoulder pain  Muscle weakness (generalized)     Problem List Patient Active Problem List   Diagnosis Date Noted  . CHRONIC RHINITIS 10/08/2009  . HYPERLIPIDEMIA 05/31/2009  . HYPERTENSION 05/31/2009  . COUGH 05/31/2009  . RETINAL DETACHMENT, BILATERAL, HX OF 05/31/2009    Gabriela Eves, PT, DPT 01/11/2018, 12:11 PM  [facility] [address] [place], [place], [address] Phone: [phone]   Fax:  [phone]  Name: Casey Cooley MRN: [id number] Date of Birth: 02/23/1933

## 2018-01-14 ENCOUNTER — Ambulatory Visit: Payer: Medicare Other | Admitting: Physical Therapy

## 2018-01-14 DIAGNOSIS — M25611 Stiffness of right shoulder, not elsewhere classified: Secondary | ICD-10-CM | POA: Diagnosis not present

## 2018-01-14 DIAGNOSIS — M6281 Muscle weakness (generalized): Secondary | ICD-10-CM

## 2018-01-14 DIAGNOSIS — M25511 Pain in right shoulder: Secondary | ICD-10-CM

## 2018-01-14 DIAGNOSIS — G8929 Other chronic pain: Secondary | ICD-10-CM

## 2018-01-14 NOTE — Therapy (Signed)
[facility] [place], [place], [address] Phone: [phone]   Fax:  [phone]  Physical Therapy Treatment  Patient Details  Name: Casey Cooley MRN: [id number] Date of Birth: May 18, 1933 Referring Provider: Remo Lipps Case   Encounter Date: 01/14/2018  PT End of Session - 01/14/18 1122    Visit Number  8    Number of Visits  24    Date for PT Re-Evaluation  03/18/18    Authorization Type  Progress note every 10th visit    PT Start Time  1120    PT Stop Time  1210    PT Time Calculation (min)  50 min    Activity Tolerance  Patient tolerated treatment well    Behavior During Therapy  [facility] for tasks assessed/performed       No past medical history on file.  No past surgical history on file.  There were no vitals filed for this visit.  Subjective Assessment - 01/14/18 1123    Subjective  "I didn't sleep well last night."    Pertinent History  DM    Limitations  Lifting;House hold activities    Diagnostic tests  X-Ray, MRI    Patient Stated Goals  get my arm better    Currently in Pain?  No/denies         [facility] PT Assessment - 01/14/18 0001      Assessment   Medical Diagnosis  R RTC repair    Onset Date/Surgical Date  10/30/17    Hand Dominance  Right    Prior Therapy  no      Precautions   Precautions  Shoulder    Type of Shoulder Precautions  RTC repair, see media                   [facility] Adult PT Treatment/Exercise - 01/14/18 0001      Elbow Exercises   Elbow Flexion  AROM;Right;20 reps;Supine      Shoulder Exercises: Supine   External Rotation  AAROM 3 minutes      Shoulder Exercises: Isometric Strengthening   Flexion  Supine;Other (comment) x20 2"    External Rotation  Supine;Other (comment) x20 2"    Internal Rotation  Supine;Other (comment) x20, x2"    Other Isometric Exercises  right elbow flexion isometric x20 2" hold,      Modalities   Modalities  Electrical Stimulation;Moist  Heat      Moist Heat Therapy   Number Minutes Moist Heat  15 Minutes    Moist Heat Location  Shoulder      Electrical Stimulation   Electrical Stimulation Location  R shoulder    Electrical Stimulation Action  IFC    Electrical Stimulation Parameters  1-10 hz x15 mins    Electrical Stimulation Goals  Pain      Manual Therapy   Manual Therapy  Passive ROM    Passive ROM  Gentle PROM of R shoulder into flexion, ER with gentle holds at end range                  PT Long Term Goals - 01/11/18 1153      PT LONG TERM GOAL #1   Title  Patient will be independent with HEP    Time  12    Period  Weeks    Status  On-going still in PROM phase      PT LONG TERM GOAL #2   Title  Patient will improve  right shoulder flexion AROM to 110 degrees or greater to perform functional activities.    Time  12    Period  Weeks    Status  On-going still in PROM phase      PT LONG TERM GOAL #3   Title  Patient will improve right shoulder ER AROM to 50+ degrees in order to don/doff apparel.    Time  12    Period  Weeks    Status  On-going Still in PROM phase      PT LONG TERM GOAL #4   Title  Patient will improve right shoulder MMT to 4-4+/5 in all planes to improve stability during functional activities.    Time  12    Period  Weeks    Status  On-going            Plan - 01/14/18 1213    Clinical Impression Statement  Patient continues to tolerate treatment fairly. Patient continues to have limited flexion and ER PROM. Patient was able to recite no pushing up/pulling but patient is still reporting using his arm for ADLs. Patient instructed to use L UE for any functional activities. Normal response to modalities upon removal.     Clinical Presentation  Stable    Clinical Decision Making  Low    Clinical Impairments Affecting Rehab Potential  poor recall of precautions, DM, slow healing process    PT Frequency  2x / week    PT Duration  12 weeks    PT Treatment/Interventions   Electrical Stimulation;Passive range of motion;Manual techniques;ADLs/Self Care Home Management;Iontophoresis 4mg /ml Dexamethasone;Therapeutic activities;Therapeutic exercise;Patient/family education;Moist Heat;Ultrasound    PT Next Visit Plan  Per protocol, see media. MHP and e-stim for pain relief.    Consulted and Agree with Plan of Care  Patient       Patient will benefit from skilled therapeutic intervention in order to improve the following deficits and impairments:  Pain, Decreased activity tolerance, Decreased endurance, Decreased range of motion, Decreased strength, Impaired UE functional use, Decreased knowledge of precautions  Visit Diagnosis: Stiffness of right shoulder, not elsewhere classified  Chronic right shoulder pain  Muscle weakness (generalized)     Problem List Patient Active Problem List   Diagnosis Date Noted  . CHRONIC RHINITIS 10/08/2009  . HYPERLIPIDEMIA 05/31/2009  . HYPERTENSION 05/31/2009  . COUGH 05/31/2009  . RETINAL DETACHMENT, BILATERAL, HX OF 05/31/2009   Gabriela Eves, PT, DPT 01/14/2018, 12:25 PM  [facility] [address] [place], [place], [address] Phone: [phone]   Fax:  [phone]  Name: Casey Cooley MRN: [id number] Date of Birth: [birth year]

## 2018-01-18 ENCOUNTER — Ambulatory Visit: Payer: Medicare Other | Admitting: Physical Therapy

## 2018-01-18 DIAGNOSIS — M25511 Pain in right shoulder: Secondary | ICD-10-CM

## 2018-01-18 DIAGNOSIS — M25611 Stiffness of right shoulder, not elsewhere classified: Secondary | ICD-10-CM | POA: Diagnosis not present

## 2018-01-18 DIAGNOSIS — M6281 Muscle weakness (generalized): Secondary | ICD-10-CM

## 2018-01-18 DIAGNOSIS — G8929 Other chronic pain: Secondary | ICD-10-CM

## 2018-01-18 NOTE — Therapy (Signed)
[facility] [place], [place], [address] Phone: [phone]   Fax:  [phone]  Physical Therapy Treatment  Patient Details  Name: Casey Cooley MRN: [id number] Date of Birth: Sep 09, 1932 Referring Provider: Remo Lipps Case   Encounter Date: 01/18/2018  PT End of Session - 01/18/18 1122    Visit Number  9    Number of Visits  24    Date for PT Re-Evaluation  03/18/18    Authorization Type  Progress note every 10th visit    PT Start Time  1115    PT Stop Time  1207    PT Time Calculation (min)  52 min    Activity Tolerance  Patient tolerated treatment well    Behavior During Therapy  [facility] for tasks assessed/performed       No past medical history on file.  No past surgical history on file.  There were no vitals filed for this visit.  Subjective Assessment - 01/18/18 1126    Subjective  Patient reported sleeping in his bed last night but reported feeling uncomfortable.    Pertinent History  DM    Limitations  Lifting;House hold activities    Diagnostic tests  X-Ray, MRI    Patient Stated Goals  get my arm better    Currently in Pain?  No/denies         [facility] PT Assessment - 01/18/18 0001      Assessment   Medical Diagnosis  R RTC repair    Onset Date/Surgical Date  10/30/17    Hand Dominance  Right    Prior Therapy  no      Precautions   Precautions  Shoulder    Type of Shoulder Precautions  RTC repair, see media                   [facility] Adult PT Treatment/Exercise - 01/18/18 0001      Elbow Exercises   Elbow Flexion  AROM;Right;Other reps (comment) 3 minutes      Shoulder Exercises: Supine   External Rotation  AAROM 3 minutes      Shoulder Exercises: Isometric Strengthening   Flexion  Supine;Other (comment) x20 2" hold    External Rotation  Supine;Other (comment) x20 x2 second hold    Internal Rotation  Supine;Other (comment) 20 x2 second hold    Other Isometric Exercises  right elbow  flexion isometric x20 2" hold,      Modalities   Modalities  Electrical Stimulation;Moist Heat      Moist Heat Therapy   Number Minutes Moist Heat  15 Minutes    Moist Heat Location  Shoulder      Electrical Stimulation   Electrical Stimulation Location  R shoulder    Electrical Stimulation Action  IFC    Electrical Stimulation Parameters  1-10 hz x15 min    Electrical Stimulation Goals  Pain      Manual Therapy   Manual Therapy  Passive ROM    Passive ROM  Gentle PROM of R shoulder into flexion, ER with gentle holds at end range                  PT Long Term Goals - 01/11/18 1153      PT LONG TERM GOAL #1   Title  Patient will be independent with HEP    Time  12    Period  Weeks    Status  On-going still in PROM phase  PT LONG TERM GOAL #2   Title  Patient will improve right shoulder flexion AROM to 110 degrees or greater to perform functional activities.    Time  12    Period  Weeks    Status  On-going still in PROM phase      PT LONG TERM GOAL #3   Title  Patient will improve right shoulder ER AROM to 50+ degrees in order to don/doff apparel.    Time  12    Period  Weeks    Status  On-going Still in PROM phase      PT LONG TERM GOAL #4   Title  Patient will improve right shoulder MMT to 4-4+/5 in all planes to improve stability during functional activities.    Time  12    Period  Weeks    Status  On-going            Plan - 01/18/18 1158    Clinical Impression Statement  Patient continues to tolerate treatment fairly. Patient continues to demonstrate muscle guarding with PROM ER with intermittent oscillations and verbal cues for relaxation. No adverse effects noted upon removal.    Clinical Presentation  Stable    Clinical Decision Making  Low    Rehab Potential  Fair    Clinical Impairments Affecting Rehab Potential  poor recall of precautions, DM, slow healing process    PT Frequency  2x / week    PT Duration  12 weeks    PT Next Visit  Plan  Progress note next visit. Per protocol, see media. MHP and e-stim for pain relief.    Consulted and Agree with Plan of Care  Patient       Patient will benefit from skilled therapeutic intervention in order to improve the following deficits and impairments:  Pain, Decreased activity tolerance, Decreased endurance, Decreased range of motion, Decreased strength, Impaired UE functional use, Decreased knowledge of precautions  Visit Diagnosis: Stiffness of right shoulder, not elsewhere classified  Chronic right shoulder pain  Muscle weakness (generalized)     Problem List Patient Active Problem List   Diagnosis Date Noted  . CHRONIC RHINITIS 10/08/2009  . HYPERLIPIDEMIA 05/31/2009  . HYPERTENSION 05/31/2009  . COUGH 05/31/2009  . RETINAL DETACHMENT, BILATERAL, HX OF 05/31/2009   Gabriela Eves, PT, DPT 01/18/2018, 12:09 PM  [facility] [address] [place], [place], [address] Phone: [phone]   Fax:  [phone]  Name: Casey Cooley MRN: [id number] Date of Birth: [birth year]

## 2018-01-21 ENCOUNTER — Encounter: Payer: Self-pay | Admitting: Physical Therapy

## 2018-01-21 ENCOUNTER — Ambulatory Visit: Payer: Medicare Other | Admitting: Physical Therapy

## 2018-01-21 DIAGNOSIS — M25611 Stiffness of right shoulder, not elsewhere classified: Secondary | ICD-10-CM | POA: Diagnosis not present

## 2018-01-21 DIAGNOSIS — M6281 Muscle weakness (generalized): Secondary | ICD-10-CM

## 2018-01-21 DIAGNOSIS — M25511 Pain in right shoulder: Secondary | ICD-10-CM

## 2018-01-21 DIAGNOSIS — G8929 Other chronic pain: Secondary | ICD-10-CM

## 2018-01-21 NOTE — Therapy (Signed)
[facility] [place], [place], [address] Phone: [phone]   Fax:  [phone]  Physical Therapy Treatment  Patient Details  Name: Casey Cooley MRN: [id number] Date of Birth: [birth year] Referring Provider: Remo Lipps Case   Encounter Date: 01/21/2018  PT End of Session - 01/21/18 1123    Visit Number  10    Number of Visits  24    Date for PT Re-Evaluation  03/18/18    Authorization Type  Progress note every 10th visit    PT Start Time  1123    PT Stop Time  1210    PT Time Calculation (min)  47 min    Activity Tolerance  Patient tolerated treatment well    Behavior During Therapy  [facility] for tasks assessed/performed       History reviewed. No pertinent past medical history.  History reviewed. No pertinent surgical history.  There were no vitals filed for this visit.  Subjective Assessment - 01/21/18 1123    Subjective  Reports his shoulder is okay today. Reports that he didn't rest good last night. Reports that he tried to sleep in the bed last night.    Pertinent History  DM    Limitations  Lifting;House hold activities    Diagnostic tests  X-Ray, MRI    Patient Stated Goals  get my arm better    Currently in Pain?  No/denies         [facility] PT Assessment - 01/21/18 0001      Assessment   Medical Diagnosis  R RTC repair    Onset Date/Surgical Date  10/30/17    Hand Dominance  Right    Next MD Visit  01/28/2018    Prior Therapy  no      Precautions   Precautions  Shoulder    Type of Shoulder Precautions  RTC repair, see media                   [facility] Adult PT Treatment/Exercise - 01/21/18 0001      Exercises   Exercises  Shoulder;Elbow      Elbow Exercises   Elbow Flexion  AROM;Right;Other reps (comment);Supine x30 reps      Shoulder Exercises: Supine   External Rotation  AAROM;Right;Other (comment) x30 reps      Shoulder Exercises: Isometric Strengthening   External Rotation   Supine;Other (comment) x20 reps 5 sec hold    Internal Rotation  Supine;Other (comment) x20 reps 5 sec hold    Other Isometric Exercises  right elbow flexion isometric x20 5" hold,      Modalities   Modalities  Electrical Stimulation;Moist Heat      Moist Heat Therapy   Number Minutes Moist Heat  15 Minutes    Moist Heat Location  Shoulder      Electrical Stimulation   Electrical Stimulation Location  R shoulder    Electrical Stimulation Action  Pre-Mod    Electrical Stimulation Parameters  80-150 hz x15 min    Electrical Stimulation Goals  Pain      Manual Therapy   Manual Therapy  Passive ROM    Passive ROM  Gentle PROM of R shoulder into flexion, ER with gentle holds at end range                  PT Long Term Goals - 01/11/18 1153      PT LONG TERM GOAL #1   Title  Patient will be independent with  HEP    Time  12    Period  Weeks    Status  On-going still in PROM phase      PT LONG TERM GOAL #2   Title  Patient will improve right shoulder flexion AROM to 110 degrees or greater to perform functional activities.    Time  12    Period  Weeks    Status  On-going still in PROM phase      PT LONG TERM GOAL #3   Title  Patient will improve right shoulder ER AROM to 50+ degrees in order to don/doff apparel.    Time  12    Period  Weeks    Status  On-going Still in PROM phase      PT LONG TERM GOAL #4   Title  Patient will improve right shoulder MMT to 4-4+/5 in all planes to improve stability during functional activities.    Time  12    Period  Weeks    Status  On-going            Plan - 01/21/18 1313    Clinical Impression Statement  Patient tolerated today's treatment well although he reports limited ability in raising his arm. Patient able to report improvements in being able to brush his teeth with his RUE although he has to bend down slightly to accomplish. Patient able to demonstrate fairly good R shoulder isometric strength ER and IR. Firm end  feels and limited R shoulder flexion and ER. Normal modalities response noted following removal of the modalities. Patient's limited progress due to limitations with ROM and protocol.    Rehab Potential  Fair    Clinical Impairments Affecting Rehab Potential  poor recall of precautions, DM, slow healing process    PT Frequency  2x / week    PT Duration  12 weeks    PT Treatment/Interventions  Electrical Stimulation;Passive range of motion;Manual techniques;ADLs/Self Care Home Management;Iontophoresis 4mg /ml Dexamethasone;Therapeutic activities;Therapeutic exercise;Patient/family education;Moist Heat;Ultrasound    PT Next Visit Plan  Progress note next visit. Per protocol, see media. MHP and e-stim for pain relief.    Consulted and Agree with Plan of Care  Patient       Patient will benefit from skilled therapeutic intervention in order to improve the following deficits and impairments:  Pain, Decreased activity tolerance, Decreased endurance, Decreased range of motion, Decreased strength, Impaired UE functional use, Decreased knowledge of precautions  Visit Diagnosis: Stiffness of right shoulder, not elsewhere classified  Chronic right shoulder pain  Muscle weakness (generalized)     Problem List Patient Active Problem List   Diagnosis Date Noted  . CHRONIC RHINITIS 10/08/2009  . HYPERLIPIDEMIA 05/31/2009  . HYPERTENSION 05/31/2009  . COUGH 05/31/2009  . RETINAL DETACHMENT, BILATERAL, HX OF 05/31/2009    Standley Brooking, PTA 01/21/2018, 2:24 PM  [facility] [address] [place], [place], [address] Phone: [phone]   Fax:  [phone]  Name: Casey Cooley MRN: [id number] Date of Birth: 10-07-1932

## 2018-01-25 ENCOUNTER — Ambulatory Visit: Payer: Medicare Other | Admitting: Physical Therapy

## 2018-01-25 ENCOUNTER — Encounter: Payer: Self-pay | Admitting: Physical Therapy

## 2018-01-25 DIAGNOSIS — M25611 Stiffness of right shoulder, not elsewhere classified: Secondary | ICD-10-CM | POA: Diagnosis not present

## 2018-01-25 DIAGNOSIS — G8929 Other chronic pain: Secondary | ICD-10-CM

## 2018-01-25 DIAGNOSIS — M25511 Pain in right shoulder: Secondary | ICD-10-CM

## 2018-01-25 DIAGNOSIS — M6281 Muscle weakness (generalized): Secondary | ICD-10-CM

## 2018-01-25 NOTE — Therapy (Signed)
[facility] [place], [place], [address] Phone: [phone]   Fax:  [phone]  Physical Therapy Treatment  Patient Details  Name: Casey Cooley MRN: [id number] Date of Birth: [birth year] Referring Provider: Remo Lipps Case   Encounter Date: 01/25/2018  PT End of Session - 01/25/18 1138    Visit Number  11    Number of Visits  24    Date for PT Re-Evaluation  03/18/18    Authorization Type  Progress note every 10th visit    PT Start Time  1116    PT Stop Time  1157    PT Time Calculation (min)  41 min    Activity Tolerance  Patient tolerated treatment well    Behavior During Therapy  [facility] for tasks assessed/performed       History reviewed. No pertinent past medical history.  History reviewed. No pertinent surgical history.  There were no vitals filed for this visit.  Subjective Assessment - 01/25/18 1115    Subjective  Patient reported some discomfort in shoulder and has been using right arm for light ADL's    Pertinent History  DM    Limitations  Lifting;House hold activities    Diagnostic tests  X-Ray, MRI    Patient Stated Goals  get my arm better    Currently in Pain?  Yes    Pain Score  2     Pain Location  Shoulder    Pain Orientation  Right    Pain Descriptors / Indicators  Aching;Discomfort    Pain Type  Surgical pain    Pain Onset  More than a month ago    Pain Frequency  Intermittent    Aggravating Factors   movement    Pain Relieving Factors  rest         [facility] PT Assessment - 01/25/18 0001      PROM   PROM Assessment Site  Shoulder    Right/Left Shoulder  Right    Right Shoulder Flexion  110 Degrees    Right Shoulder External Rotation  30 Degrees                   [facility] Adult PT Treatment/Exercise - 01/25/18 0001      Elbow Exercises   Elbow Flexion  AROM;Right;Other reps (comment);Supine;20 reps;10 reps      Shoulder Exercises: Supine   External Rotation   AAROM;Right;Other (comment) 3x10      Shoulder Exercises: Isometric Strengthening   External Rotation  Supine;Other (comment) 2x10    Internal Rotation  Supine;Other (comment) 2x10    Other Isometric Exercises  right elbow flexion isometric x20 5" hold,      Moist Heat Therapy   Number Minutes Moist Heat  15 Minutes    Moist Heat Location  Shoulder      Electrical Stimulation   Electrical Stimulation Location  R shoulder    Electrical Stimulation Action  premod    Electrical Stimulation Parameters  80-150hz  x29min    Electrical Stimulation Goals  Pain      Manual Therapy   Manual Therapy  Passive ROM    Passive ROM  Gentle PROM of R shoulder into flexion, ER with gentle holds at end range                  PT Long Term Goals - 01/11/18 1153      PT LONG TERM GOAL #1   Title  Patient will be independent with  HEP    Time  12    Period  Weeks    Status  On-going still in PROM phase      PT LONG TERM GOAL #2   Title  Patient will improve right shoulder flexion AROM to 110 degrees or greater to perform functional activities.    Time  12    Period  Weeks    Status  On-going still in PROM phase      PT LONG TERM GOAL #3   Title  Patient will improve right shoulder ER AROM to 50+ degrees in order to don/doff apparel.    Time  12    Period  Weeks    Status  On-going Still in PROM phase      PT LONG TERM GOAL #4   Title  Patient will improve right shoulder MMT to 4-4+/5 in all planes to improve stability during functional activities.    Time  12    Period  Weeks    Status  On-going            Plan - 01/25/18 1139    Clinical Impression Statement  Patient tolerated treatment well today. patient reported using his right UE for light ADL's yet no lifting. Patient has improved ROM for right shoulder ROM yet limited and very guarded with ROM and required cues to relax. Patient goals ongoing.     Rehab Potential  Fair    Clinical Impairments Affecting Rehab  Potential  poor recall of precautions, DM, slow healing process    PT Frequency  2x / week    PT Duration  12 weeks    PT Treatment/Interventions  Electrical Stimulation;Passive range of motion;Manual techniques;ADLs/Self Care Home Management;Iontophoresis 4mg /ml Dexamethasone;Therapeutic activities;Therapeutic exercise;Patient/family education;Moist Heat;Ultrasound    PT Next Visit Plan  cont Per protocol, see media. MHP and e-stim for pain relief MD note sent    Consulted and Agree with Plan of Care  Patient       Patient will benefit from skilled therapeutic intervention in order to improve the following deficits and impairments:  Pain, Decreased activity tolerance, Decreased endurance, Decreased range of motion, Decreased strength, Impaired UE functional use, Decreased knowledge of precautions  Visit Diagnosis: Stiffness of right shoulder, not elsewhere classified  Chronic right shoulder pain  Muscle weakness (generalized)     Problem List Patient Active Problem List   Diagnosis Date Noted  . CHRONIC RHINITIS 10/08/2009  . HYPERLIPIDEMIA 05/31/2009  . HYPERTENSION 05/31/2009  . COUGH 05/31/2009  . RETINAL DETACHMENT, BILATERAL, HX OF 05/31/2009    Ladean Raya, PTA 01/25/18 11:57 AM   [facility] [place], [place], [address] Phone: [phone]   Fax:  [phone]  Name: TAJAH SCHREINER MRN: [id number] Date of Birth: [birth year]

## 2018-02-01 ENCOUNTER — Ambulatory Visit: Payer: Medicare Other | Admitting: Physical Therapy

## 2018-02-01 ENCOUNTER — Encounter: Payer: Self-pay | Admitting: Physical Therapy

## 2018-02-01 DIAGNOSIS — M25611 Stiffness of right shoulder, not elsewhere classified: Secondary | ICD-10-CM

## 2018-02-01 DIAGNOSIS — M6281 Muscle weakness (generalized): Secondary | ICD-10-CM

## 2018-02-01 DIAGNOSIS — M25511 Pain in right shoulder: Secondary | ICD-10-CM

## 2018-02-01 DIAGNOSIS — G8929 Other chronic pain: Secondary | ICD-10-CM

## 2018-02-01 NOTE — Therapy (Signed)
[facility] [place], [place], [address] Phone: [phone]   Fax:  [phone]  Physical Therapy Treatment  Patient Details  Name: Casey Cooley MRN: [id number] Date of Birth: February 18, 1933 Referring Provider: Remo Lipps Case   Encounter Date: 02/01/2018  PT End of Session - 02/01/18 1033    Visit Number  12    Number of Visits  24    Date for PT Re-Evaluation  03/18/18    Authorization Type  Progress note every 10th visit    PT Start Time  1033    PT Stop Time  1121    PT Time Calculation (min)  48 min    Activity Tolerance  Patient tolerated treatment well    Behavior During Therapy  [facility] for tasks assessed/performed       History reviewed. No pertinent past medical history.  History reviewed. No pertinent surgical history.  There were no vitals filed for this visit.  Subjective Assessment - 02/01/18 1032    Subjective  Reports that MD said to continue his routine and reports that he was given a red theraband by MD as well.    Pertinent History  DM    Limitations  Lifting;House hold activities    Diagnostic tests  X-Ray, MRI    Patient Stated Goals  get my arm better    Currently in Pain?  No/denies         [facility] PT Assessment - 02/01/18 0001      Assessment   Medical Diagnosis  R RTC repair    Onset Date/Surgical Date  10/30/17    Hand Dominance  Right    Next MD Visit  03/2018    Prior Therapy  no      Precautions   Precautions  Shoulder    Type of Shoulder Precautions  RTC repair, see media                   [facility] Adult PT Treatment/Exercise - 02/01/18 0001      Elbow Exercises   Elbow Flexion  AROM;Right;Other reps (comment);Supine 3x10 reps      Shoulder Exercises: Supine   Protraction  AAROM;Both;20 reps within pain free range    External Rotation  AAROM;Right;20 reps      Shoulder Exercises: Isometric Strengthening   Flexion  Supine;Other (comment) x20 reps with 2 sec hold     External Rotation  Supine;Other (comment) x20 reps 2 sec    Internal Rotation  Supine;Other (comment) x20 reps 2 sec hold    Other Isometric Exercises  right elbow flexion isometric x20 5" hold,      Modalities   Modalities  Electrical Stimulation;Moist Heat      Moist Heat Therapy   Number Minutes Moist Heat  15 Minutes    Moist Heat Location  Shoulder      Electrical Stimulation   Electrical Stimulation Location  R shoulder    Electrical Stimulation Action  Pre-Mod    Electrical Stimulation Parameters  80-150 hz x15 min    Electrical Stimulation Goals  Pain      Manual Therapy   Manual Therapy  Passive ROM    Passive ROM  Gentle PROM of R shoulder into flexion with gentle holds at end range                  PT Long Term Goals - 01/11/18 1153      PT LONG TERM GOAL #1   Title  Patient will be independent with HEP    Time  12    Period  Weeks    Status  On-going still in PROM phase      PT LONG TERM GOAL #2   Title  Patient will improve right shoulder flexion AROM to 110 degrees or greater to perform functional activities.    Time  12    Period  Weeks    Status  On-going still in PROM phase      PT LONG TERM GOAL #3   Title  Patient will improve right shoulder ER AROM to 50+ degrees in order to don/doff apparel.    Time  12    Period  Weeks    Status  On-going Still in PROM phase      PT LONG TERM GOAL #4   Title  Patient will improve right shoulder MMT to 4-4+/5 in all planes to improve stability during functional activities.    Time  12    Period  Weeks    Status  On-going            Plan - 02/01/18 1109    Clinical Impression Statement  Patient tolerated today's treatment well as he arrived with no pain. Patient reported discomfort with AAROM protraction today. Patient able to tolerate short duration holds for R shoulder isometric especially into shoulder flexion and ER, IR. Patient able to tolerate more PROM of R shoulder into flexion today as  well. Patient did not demonstrate as much R shoulder guarding during PROM session. Normal modalities response noted following removal of the modalities.    Rehab Potential  Fair    Clinical Impairments Affecting Rehab Potential  poor recall of precautions, DM, slow healing process    PT Frequency  2x / week    PT Duration  12 weeks    PT Treatment/Interventions  Electrical Stimulation;Passive range of motion;Manual techniques;ADLs/Self Care Home Management;Iontophoresis 4mg /ml Dexamethasone;Therapeutic activities;Therapeutic exercise;Patient/family education;Moist Heat;Ultrasound    PT Next Visit Plan  cont Per protocol, see media. MHP and e-stim for pain relief MD note sent    Consulted and Agree with Plan of Care  Patient       Patient will benefit from skilled therapeutic intervention in order to improve the following deficits and impairments:  Pain, Decreased activity tolerance, Decreased endurance, Decreased range of motion, Decreased strength, Impaired UE functional use, Decreased knowledge of precautions  Visit Diagnosis: Stiffness of right shoulder, not elsewhere classified  Chronic right shoulder pain  Muscle weakness (generalized)     Problem List Patient Active Problem List   Diagnosis Date Noted  . CHRONIC RHINITIS 10/08/2009  . HYPERLIPIDEMIA 05/31/2009  . HYPERTENSION 05/31/2009  . COUGH 05/31/2009  . RETINAL DETACHMENT, BILATERAL, HX OF 05/31/2009    Standley Brooking, PTA 02/01/2018, 11:25 AM  [facility] [address] [place], [place], [address] Phone: [phone]   Fax:  [phone]  Name: DRAPER GALLON MRN: [id number] Date of Birth: 1932-09-16

## 2018-02-04 ENCOUNTER — Ambulatory Visit: Payer: Medicare Other | Admitting: Physical Therapy

## 2018-02-04 DIAGNOSIS — G8929 Other chronic pain: Secondary | ICD-10-CM

## 2018-02-04 DIAGNOSIS — M25611 Stiffness of right shoulder, not elsewhere classified: Secondary | ICD-10-CM | POA: Diagnosis not present

## 2018-02-04 DIAGNOSIS — M6281 Muscle weakness (generalized): Secondary | ICD-10-CM

## 2018-02-04 DIAGNOSIS — M25511 Pain in right shoulder: Secondary | ICD-10-CM

## 2018-02-04 NOTE — Therapy (Signed)
[facility] [place], [place], [address] Phone: [phone]   Fax:  [phone]  Physical Therapy Treatment  Patient Details  Name: Casey Cooley MRN: [id number] Date of Birth: [birth year] Referring Provider: Remo Lipps Case   Encounter Date: 02/04/2018  PT End of Session - 02/04/18 1039    Visit Number  13    Number of Visits  24    Date for PT Re-Evaluation  03/18/18    Authorization Type  Progress note every 10th visit    PT Start Time  1030    PT Stop Time  1121    PT Time Calculation (min)  51 min    Activity Tolerance  Patient limited by pain;Patient tolerated treatment well    Behavior During Therapy  [facility] for tasks assessed/performed       No past medical history on file.  No past surgical history on file.  There were no vitals filed for this visit.  Subjective Assessment - 02/04/18 1036    Subjective  Patient's caretaker brought in HEP to review with patient . Patient reported MD visit went alright.    Pertinent History  DM    Limitations  Lifting;House hold activities    Diagnostic tests  X-Ray, MRI    Patient Stated Goals  get my arm better    Currently in Pain?  No/denies    Pain Location  Shoulder    Pain Orientation  Right    Pain Descriptors / Indicators  Aching;Discomfort    Pain Type  Surgical pain    Pain Onset  More than a month ago    Pain Frequency  Intermittent         [facility] PT Assessment - 02/04/18 0001      Assessment   Medical Diagnosis  R RTC repair    Onset Date/Surgical Date  10/30/17    Hand Dominance  Right    Next MD Visit  03/2018    Prior Therapy  no                   [facility] Adult PT Treatment/Exercise - 02/04/18 0001      Shoulder Exercises: Supine   Protraction  AAROM;Both;20 reps x2 minutes    Flexion  AAROM;Other (comment) 2 minutes 10" hold    Other Supine Exercises  abduction/horizontal abduction x2 minutes      Shoulder Exercises: Standing   Other  Standing Exercises  pendulums, circles clockwise and counterclockwise, AP 2 minutes each      Shoulder Exercises: ROM/Strengthening   Wall Wash  flexion x10 with 10 second hold      Shoulder Exercises: Stretch   Other Shoulder Stretches  doorway stretch 15 seconds x3      Modalities   Modalities  Electrical Stimulation;Moist Heat      Moist Heat Therapy   Number Minutes Moist Heat  10 Minutes    Moist Heat Location  Shoulder      Electrical Stimulation   Electrical Stimulation Location  R shoulder    Electrical Stimulation Action  IFC    Electrical Stimulation Parameters  80-150 hz x10 min    Electrical Stimulation Goals  Pain      Manual Therapy   Manual Therapy  Passive ROM    Passive ROM  Gentle PROM of R shoulder into flexion and ER with gentle holds at end range  PT Long Term Goals - 01/11/18 1153      PT LONG TERM GOAL #1   Title  Patient will be independent with HEP    Time  12    Period  Weeks    Status  On-going still in PROM phase      PT LONG TERM GOAL #2   Title  Patient will improve right shoulder flexion AROM to 110 degrees or greater to perform functional activities.    Time  12    Period  Weeks    Status  On-going still in PROM phase      PT LONG TERM GOAL #3   Title  Patient will improve right shoulder ER AROM to 50+ degrees in order to don/doff apparel.    Time  12    Period  Weeks    Status  On-going Still in PROM phase      PT LONG TERM GOAL #4   Title  Patient will improve right shoulder MMT to 4-4+/5 in all planes to improve stability during functional activities.    Time  12    Period  Weeks    Status  On-going            Plan - 02/04/18 1205    Clinical Impression Statement  Patient was able to tolerate treatment fairly and was limited by pain. Patient was guided through exercises provided by Dr. Case. Patient required multiple cues for form and technique. Patient was instructed exercises and stretches  will be uncomfortable but do not stretch beyond pain. Patient reported understanding, Patient continues to demonstrate muscle guarding with ER PROM. Normal response to modalities at end of session.    Clinical Decision Making  Low    Rehab Potential  Fair    Clinical Impairments Affecting Rehab Potential  poor recall of precautions, DM, slow healing process    PT Frequency  2x / week    PT Duration  12 weeks    PT Treatment/Interventions  Electrical Stimulation;Passive range of motion;Manual techniques;ADLs/Self Care Home Management;Iontophoresis 4mg /ml Dexamethasone;Therapeutic activities;Therapeutic exercise;Patient/family education;Moist Heat;Ultrasound    PT Next Visit Plan  cont Per protocol, see media. MHP and e-stim for pain relief    Consulted and Agree with Plan of Care  Patient       Patient will benefit from skilled therapeutic intervention in order to improve the following deficits and impairments:  Pain, Decreased activity tolerance, Decreased endurance, Decreased range of motion, Decreased strength, Impaired UE functional use, Decreased knowledge of precautions  Visit Diagnosis: Stiffness of right shoulder, not elsewhere classified  Chronic right shoulder pain  Muscle weakness (generalized)     Problem List Patient Active Problem List   Diagnosis Date Noted  . CHRONIC RHINITIS 10/08/2009  . HYPERLIPIDEMIA 05/31/2009  . HYPERTENSION 05/31/2009  . COUGH 05/31/2009  . RETINAL DETACHMENT, BILATERAL, HX OF 05/31/2009   Gabriela Eves, PT, DPT 02/04/2018, 12:13 PM  [facility] [address] [place], [place], [address] Phone: [phone]   Fax:  [phone]  Name: DORNELL GRASMICK MRN: [id number] Date of Birth: Sep 12, 1932

## 2018-02-09 ENCOUNTER — Encounter: Payer: Self-pay | Admitting: Physical Therapy

## 2018-02-09 ENCOUNTER — Ambulatory Visit: Payer: Medicare Other | Attending: Orthopedic Surgery | Admitting: Physical Therapy

## 2018-02-09 DIAGNOSIS — M25511 Pain in right shoulder: Secondary | ICD-10-CM | POA: Insufficient documentation

## 2018-02-09 DIAGNOSIS — M6281 Muscle weakness (generalized): Secondary | ICD-10-CM | POA: Diagnosis present

## 2018-02-09 DIAGNOSIS — M25611 Stiffness of right shoulder, not elsewhere classified: Secondary | ICD-10-CM

## 2018-02-09 DIAGNOSIS — G8929 Other chronic pain: Secondary | ICD-10-CM | POA: Diagnosis present

## 2018-02-09 NOTE — Therapy (Signed)
[facility] [place], [place], [address] Phone: [phone]   Fax:  [phone]  Physical Therapy Treatment  Patient Details  Name: Casey Cooley MRN: [id number] Date of Birth: 01-Sep-1932 Referring Provider: Remo Lipps Case   Encounter Date: 02/09/2018  PT End of Session - 02/09/18 1122    Visit Number  14    Number of Visits  24    Date for PT Re-Evaluation  03/18/18    Authorization Type  Progress note every 10th visit    PT Start Time  1118    PT Stop Time  1206    PT Time Calculation (min)  48 min    Activity Tolerance  Patient limited by pain;Patient tolerated treatment well    Behavior During Therapy  [facility] for tasks assessed/performed       History reviewed. No pertinent past medical history.  History reviewed. No pertinent surgical history.  There were no vitals filed for this visit.  Subjective Assessment - 02/09/18 1121    Subjective  Reports that his shoulder feels okay and doing the best he can. Reports he still needs help with showering, bathing, and drying off as well as putting on his shirts.    Pertinent History  DM    Limitations  Lifting;House hold activities    Diagnostic tests  X-Ray, MRI    Patient Stated Goals  get my arm better    Currently in Pain?  No/denies         [facility] PT Assessment - 02/09/18 0001      Assessment   Medical Diagnosis  R RTC repair    Onset Date/Surgical Date  10/30/17    Hand Dominance  Right    Next MD Visit  03/2018    Prior Therapy  no      Precautions   Precautions  Shoulder    Type of Shoulder Precautions  RTC repair, see media                   [facility] Adult PT Treatment/Exercise - 02/09/18 0001      Shoulder Exercises: Supine   Protraction  AAROM;Both;20 reps    External Rotation  AAROM;Right;20 reps    Flexion  Other (comment) attempted but unable secondary to pain      Shoulder Exercises: Standing   Flexion  AAROM;Right;10 reps required  assistance from LUE    Other Standing Exercises  Wall wash attempted but fatigued and discomfort      Shoulder Exercises: Pulleys   Flexion  5 minutes    Other Pulley Exercises  Seated UE ranger into flexion, abd/add, circles x4 min      Modalities   Modalities  Electrical Stimulation;Moist Heat      Moist Heat Therapy   Number Minutes Moist Heat  15 Minutes    Moist Heat Location  Shoulder      Electrical Stimulation   Electrical Stimulation Location  R shoulder    Electrical Stimulation Action  IFC    Electrical Stimulation Parameters  80-150 hz x15 min    Electrical Stimulation Goals  Pain      Manual Therapy   Manual Therapy  Passive ROM    Passive ROM  PROM of R shoulder into flexion with gentle holds at end range                  PT Long Term Goals - 02/09/18 1123      PT LONG TERM GOAL #  1   Title  Patient will be independent with HEP    Time  12    Period  Weeks    Status  Partially Met "a little bit" per patient report 02/09/2018      PT LONG TERM GOAL #2   Title  Patient will improve right shoulder flexion AROM to 110 degrees or greater to perform functional activities.    Time  12    Period  Weeks    Status  On-going still in PROM phase      PT LONG TERM GOAL #3   Title  Patient will improve right shoulder ER AROM to 50+ degrees in order to don/doff apparel.    Time  12    Period  Weeks    Status  On-going Still in PROM phase      PT LONG TERM GOAL #4   Title  Patient will improve right shoulder MMT to 4-4+/5 in all planes to improve stability during functional activities.    Time  12    Period  Weeks    Status  On-going            Plan - 02/09/18 1157    Clinical Impression Statement  Patient tolerated today's treatment fairly well as he is still limited with any antigravity exercises secondary to pain and weakness. Multiple new AAROM exercises against gravity were attempted today in clinic with patient unable to tlerate them for long  period of reps. Patient reported doing the new HEP from Dr. Case "a little bit" and was highly encouraged by PTA to complete as directed. PROM of R shoulder completed with patient able tolerate PROM past what was observed as 90 deg towards end of session. Normal modalities response noted following removal of the modalities.    Rehab Potential  Fair    Clinical Impairments Affecting Rehab Potential  poor recall of precautions, DM, slow healing process    PT Frequency  2x / week    PT Duration  12 weeks    PT Treatment/Interventions  Electrical Stimulation;Passive range of motion;Manual techniques;ADLs/Self Care Home Management;Iontophoresis '4mg'$ /ml Dexamethasone;Therapeutic activities;Therapeutic exercise;Patient/family education;Moist Heat;Ultrasound    PT Next Visit Plan  cont Per protocol, see media. MHP and e-stim for pain relief    Consulted and Agree with Plan of Care  Patient       Patient will benefit from skilled therapeutic intervention in order to improve the following deficits and impairments:  Pain, Decreased activity tolerance, Decreased endurance, Decreased range of motion, Decreased strength, Impaired UE functional use, Decreased knowledge of precautions  Visit Diagnosis: Stiffness of right shoulder, not elsewhere classified  Chronic right shoulder pain  Muscle weakness (generalized)     Problem List Patient Active Problem List   Diagnosis Date Noted  . CHRONIC RHINITIS 10/08/2009  . HYPERLIPIDEMIA 05/31/2009  . HYPERTENSION 05/31/2009  . COUGH 05/31/2009  . RETINAL DETACHMENT, BILATERAL, HX OF 05/31/2009    Standley Brooking, PTA 02/09/2018, 12:12 PM  [facility] [place], [place], [address] Phone: [phone]   Fax:  [phone]  Name: AMONTAE NG MRN: [id number] Date of Birth: 07/05/1933

## 2018-02-12 ENCOUNTER — Ambulatory Visit: Payer: Medicare Other | Admitting: Physical Therapy

## 2018-02-12 ENCOUNTER — Encounter: Payer: Self-pay | Admitting: Physical Therapy

## 2018-02-12 DIAGNOSIS — M25611 Stiffness of right shoulder, not elsewhere classified: Secondary | ICD-10-CM

## 2018-02-12 DIAGNOSIS — G8929 Other chronic pain: Secondary | ICD-10-CM

## 2018-02-12 DIAGNOSIS — M6281 Muscle weakness (generalized): Secondary | ICD-10-CM

## 2018-02-12 DIAGNOSIS — M25511 Pain in right shoulder: Secondary | ICD-10-CM

## 2018-02-12 NOTE — Therapy (Signed)
[facility] [place], [place], [address] Phone: [phone]   Fax:  [phone]  Physical Therapy Treatment  Patient Details  Name: Casey Cooley MRN: [id number] Date of Birth: [birth year] Referring Provider: Remo Lipps Case   Encounter Date: 02/12/2018  PT End of Session - 02/12/18 1120    Visit Number  15    Number of Visits  24    Date for PT Re-Evaluation  03/18/18    Authorization Type  Progress note every 10th visit    PT Start Time  1030    PT Stop Time  1128    PT Time Calculation (min)  58 min    Activity Tolerance  Patient limited by pain;Patient tolerated treatment well    Behavior During Therapy  [facility] for tasks assessed/performed       History reviewed. No pertinent past medical history.  History reviewed. No pertinent surgical history.  There were no vitals filed for this visit.  Subjective Assessment - 02/12/18 1119    Subjective  Patient reports he continues to have difficulties with dressing but reports he is able to eat his morning cereal himself.     Pertinent History  DM    Limitations  Lifting;House hold activities    Diagnostic tests  X-Ray, MRI    Patient Stated Goals  get my arm better    Currently in Pain?  No/denies         [facility] PT Assessment - 02/12/18 0001      Assessment   Medical Diagnosis  R RTC repair                   [facility] Adult PT Treatment/Exercise - 02/12/18 0001      Shoulder Exercises: Supine   Protraction  AAROM;Both x2 minutes    Flexion  AAROM;Right;Other (comment) Assist with unaffected UE, x2 minutes      Shoulder Exercises: Pulleys   Flexion  5 minutes    Other Pulley Exercises  Seated UE ranger into flexion, circles x3 min each      Modalities   Modalities  Electrical Stimulation;Moist Heat      Moist Heat Therapy   Number Minutes Moist Heat  10 Minutes    Moist Heat Location  Shoulder      Electrical Stimulation   Electrical Stimulation  Location  R shoulder     Electrical Stimulation Action  IFC    Electrical Stimulation Parameters  80-150 hz x10 min    Electrical Stimulation Goals  Pain      Manual Therapy   Manual Therapy  Passive ROM    Passive ROM  PROM of R shoulder into flexion with gentle holds at end range                  PT Long Term Goals - 02/09/18 1123      PT LONG TERM GOAL #1   Title  Patient will be independent with HEP    Time  12    Period  Weeks    Status  Partially Met "a little bit" per patient report 02/09/2018      PT LONG TERM GOAL #2   Title  Patient will improve right shoulder flexion AROM to 110 degrees or greater to perform functional activities.    Time  12    Period  Weeks    Status  On-going still in PROM phase      PT LONG TERM GOAL #3  Title  Patient will improve right shoulder ER AROM to 50+ degrees in order to don/doff apparel.    Time  12    Period  Weeks    Status  On-going Still in PROM phase      PT LONG TERM GOAL #4   Title  Patient will improve right shoulder MMT to 4-4+/5 in all planes to improve stability during functional activities.    Time  12    Period  Weeks    Status  On-going            Plan - 02/12/18 1120    Clinical Impression Statement  Patient was able to tolerate treatment well with minimal reports of pain. Patient instructed to perform AAROM slighty beyond a gentle stretch to improve ROM however patient unable to tolerate. Patient educated on importance of HEP to improve ROM and improve functional tasks as patient reported he only performs HEP "a little bit." Patient reported understanding. Normal response to modalities upon removal.     Clinical Presentation  Stable    Clinical Decision Making  Low    Rehab Potential  Fair    Clinical Impairments Affecting Rehab Potential  poor recall of precautions, DM, slow healing process    PT Frequency  2x / week    PT Duration  12 weeks    PT Treatment/Interventions  Electrical  Stimulation;Passive range of motion;Manual techniques;ADLs/Self Care Home Management;Iontophoresis 73m/ml Dexamethasone;Therapeutic activities;Therapeutic exercise;Patient/family education;Moist Heat;Ultrasound    PT Next Visit Plan  cont Per protocol, see media. MHP and e-stim for pain relief    Consulted and Agree with Plan of Care  Patient       Patient will benefit from skilled therapeutic intervention in order to improve the following deficits and impairments:  Pain, Decreased activity tolerance, Decreased endurance, Decreased range of motion, Decreased strength, Impaired UE functional use, Decreased knowledge of precautions  Visit Diagnosis: Stiffness of right shoulder, not elsewhere classified  Chronic right shoulder pain  Muscle weakness (generalized)     Problem List Patient Active Problem List   Diagnosis Date Noted  . CHRONIC RHINITIS 10/08/2009  . HYPERLIPIDEMIA 05/31/2009  . HYPERTENSION 05/31/2009  . COUGH 05/31/2009  . RETINAL DETACHMENT, BILATERAL, HX OF 05/31/2009   KGabriela Eves PT, DPT 02/12/2018, 11:34 AM  [facility] [address]Phone: [phone]  Fax:  [phone] Name: Casey RYLANDMRN: [id number] of Birth: [birth year]

## 2018-02-15 ENCOUNTER — Ambulatory Visit: Payer: Medicare Other | Admitting: Physical Therapy

## 2018-02-15 ENCOUNTER — Encounter: Payer: Self-pay | Admitting: Physical Therapy

## 2018-02-15 DIAGNOSIS — M25511 Pain in right shoulder: Secondary | ICD-10-CM

## 2018-02-15 DIAGNOSIS — M25611 Stiffness of right shoulder, not elsewhere classified: Secondary | ICD-10-CM

## 2018-02-15 DIAGNOSIS — M6281 Muscle weakness (generalized): Secondary | ICD-10-CM

## 2018-02-15 DIAGNOSIS — G8929 Other chronic pain: Secondary | ICD-10-CM

## 2018-02-15 NOTE — Therapy (Signed)
[facility] [place], [place], [address] Phone: [phone]   Fax:  [phone]  Physical Therapy Treatment  Patient Details  Name: Casey Cooley MRN: [id number] Date of Birth: 10-16-1932 Referring Provider: Remo Lipps Case   Encounter Date: 02/15/2018  PT End of Session - 02/15/18 1106    Visit Number  16    Number of Visits  24    Date for PT Re-Evaluation  03/18/18    Authorization Type  Progress note every 10th visit    PT Start Time  1030    PT Stop Time  1120    PT Time Calculation (min)  50 min    Activity Tolerance  Patient limited by pain;Patient tolerated treatment well    Behavior During Therapy  [facility] for tasks assessed/performed       History reviewed. No pertinent past medical history.  History reviewed. No pertinent surgical history.  There were no vitals filed for this visit.  Subjective Assessment - 02/15/18 1052    Subjective  Pt arriving to therapy reporting no pain, but reports he hasn't done anything with it this morning.     Pertinent History  DM    Limitations  Lifting;House hold activities    Diagnostic tests  X-Ray, MRI    Patient Stated Goals  get my arm better    Currently in Pain?  No/denies         [facility] PT Assessment - 02/15/18 0001      PROM   Right Shoulder Flexion  118 Degrees                   [facility] Adult PT Treatment/Exercise - 02/15/18 0001      Shoulder Exercises: Supine   Protraction  AAROM;Both x2 minutes    External Rotation  AAROM;Right;15 reps    Flexion  AAROM;Right;Other (comment) Assist with unaffected UE, x2 minutes    Other Supine Exercises  serratus punches x 15      Shoulder Exercises: Seated   Other Seated Exercises  table slides for flexion       Shoulder Exercises: Pulleys   Flexion  5 minutes    Other Pulley Exercises  Seated UE ranger into flexion, circles x3 min each      Modalities   Modalities  Electrical Stimulation;Moist Heat       Moist Heat Therapy   Number Minutes Moist Heat  15 Minutes    Moist Heat Location  Shoulder      Electrical Stimulation   Electrical Stimulation Location  r shoulder    Electrical Stimulation Action  IFC    Electrical Stimulation Parameters  80-150 Hz x 15 minutes    Electrical Stimulation Goals  Pain      Manual Therapy   Manual Therapy  Passive ROM    Passive ROM  PROM of R shoulder into flexion with gentle holds at end range             PT Education - 02/15/18 1053    Education provided  Yes    Education Details  reviewed HEP    Person(s) Educated  Patient    Methods  Explanation;Demonstration;Verbal cues    Comprehension  Verbalized understanding;Returned demonstration          PT Long Term Goals - 02/09/18 1123      PT LONG TERM GOAL #1   Title  Patient will be independent with HEP    Time  12  Period  Weeks    Status  Partially Met "a little bit" per patient report 02/09/2018      PT LONG TERM GOAL #2   Title  Patient will improve right shoulder flexion AROM to 110 degrees or greater to perform functional activities.    Time  12    Period  Weeks    Status  On-going still in PROM phase      PT LONG TERM GOAL #3   Title  Patient will improve right shoulder ER AROM to 50+ degrees in order to don/doff apparel.    Time  12    Period  Weeks    Status  On-going Still in PROM phase      PT LONG TERM GOAL #4   Title  Patient will improve right shoulder MMT to 4-4+/5 in all planes to improve stability during functional activities.    Time  12    Period  Weeks    Status  On-going            Plan - 02/15/18 1107    Clinical Impression Statement  Pt tolerating exercises well limited with ER and flexion reporting 2-3/10 pain with exercises. Pt strongly encouraged to work on his table slides and wall slides to improve ROM at home. Continue skilled PT.     Clinical Presentation  Stable    Rehab Potential  Fair    Clinical Impairments Affecting Rehab  Potential  poor recall of precautions, DM, slow healing process    PT Frequency  2x / week    PT Duration  12 weeks    PT Treatment/Interventions  Electrical Stimulation;Passive range of motion;Manual techniques;ADLs/Self Care Home Management;Iontophoresis 68m/ml Dexamethasone;Therapeutic activities;Therapeutic exercise;Patient/family education;Moist Heat;Ultrasound    PT Next Visit Plan  cont Per protocol, see media. MHP and e-stim for pain relief    Consulted and Agree with Plan of Care  Patient       Patient will benefit from skilled therapeutic intervention in order to improve the following deficits and impairments:  Pain, Decreased activity tolerance, Decreased endurance, Decreased range of motion, Decreased strength, Impaired UE functional use, Decreased knowledge of precautions  Visit Diagnosis: Stiffness of right shoulder, not elsewhere classified  Chronic right shoulder pain  Muscle weakness (generalized)     Problem List Patient Active Problem List   Diagnosis Date Noted  . CHRONIC RHINITIS 10/08/2009  . HYPERLIPIDEMIA 05/31/2009  . HYPERTENSION 05/31/2009  . COUGH 05/31/2009  . RETINAL DETACHMENT, BILATERAL, HX OF 05/31/2009    JOretha Caprice MPT 02/15/2018, 11:25 AM  [facility] [address]Phone: [phone]  Fax:  [phone] Name: Casey QUILLINMRN: [id number] of Birth: [birth year]

## 2018-02-18 ENCOUNTER — Ambulatory Visit: Payer: Medicare Other | Admitting: Physical Therapy

## 2018-02-18 ENCOUNTER — Encounter: Payer: Self-pay | Admitting: Physical Therapy

## 2018-02-18 DIAGNOSIS — G8929 Other chronic pain: Secondary | ICD-10-CM

## 2018-02-18 DIAGNOSIS — M25611 Stiffness of right shoulder, not elsewhere classified: Secondary | ICD-10-CM | POA: Diagnosis not present

## 2018-02-18 DIAGNOSIS — M25511 Pain in right shoulder: Secondary | ICD-10-CM

## 2018-02-18 DIAGNOSIS — M6281 Muscle weakness (generalized): Secondary | ICD-10-CM

## 2018-02-18 NOTE — Therapy (Addendum)
[facility] [place], [place], [address] Phone: [phone]   Fax:  [phone]  Physical Therapy Treatment/Discharge  Patient Details  Name: Casey Cooley MRN: [id number] Date of Birth: 10/21/1932 Referring Provider: Remo Lipps Case   Encounter Date: 02/18/2018  PT End of Session - 02/18/18 1045    Visit Number  17    Number of Visits  24    Date for PT Re-Evaluation  03/18/18    Authorization Type  Progress note every 10th visit    PT Start Time  1041    PT Stop Time  1138    PT Time Calculation (min)  57 min    Activity Tolerance  Patient tolerated treatment well;Patient limited by pain    Behavior During Therapy  [facility] for tasks assessed/performed       History reviewed. No pertinent past medical history.  History reviewed. No pertinent surgical history.  There were no vitals filed for this visit.  Subjective Assessment - 02/18/18 1044    Subjective  Reports some R shoulder pain but he said that happens all the time.    Pertinent History  DM    Limitations  Lifting;House hold activities    Diagnostic tests  X-Ray, MRI    Patient Stated Goals  get my arm better    Currently in Pain?  Yes    Pain Score  -- Unable to provide pain score    Pain Location  Shoulder    Pain Orientation  Right    Pain Descriptors / Indicators  Discomfort    Pain Type  Surgical pain    Pain Onset  More than a month ago         [facility] PT Assessment - 02/18/18 0001      Assessment   Medical Diagnosis  R RTC repair    Onset Date/Surgical Date  10/30/17    Hand Dominance  Right    Next MD Visit  03/2018    Prior Therapy  no      Precautions   Precautions  Shoulder    Type of Shoulder Precautions  RTC repair, see media                   [facility] Adult PT Treatment/Exercise - 02/18/18 0001      Shoulder Exercises: Supine   Protraction  AAROM;Both;20 reps    External Rotation  AAROM;Both;20 reps    Flexion   AAROM;Both;20 reps      Shoulder Exercises: Seated   Other Seated Exercises  table slides for flexion, circles x20 reps       Shoulder Exercises: Pulleys   Flexion  3 minutes    Other Pulley Exercises  Seated UE ranger into flexion, circles x30 reps      Modalities   Modalities  Electrical Stimulation;Moist Heat      Moist Heat Therapy   Number Minutes Moist Heat  15 Minutes    Moist Heat Location  Shoulder      Electrical Stimulation   Electrical Stimulation Location  R shoulder    Electrical Stimulation Action  IFC    Electrical Stimulation Parameters  80-150 hz x15 min    Electrical Stimulation Goals  Pain      Manual Therapy   Manual Therapy  Passive ROM    Passive ROM  PROM of R shoulder into flexion, ER with gentle holds at end range  PT Long Term Goals - 02/15/18 1126      PT LONG TERM GOAL #1   Title  Patient will be independent with HEP    Time  12    Period  Weeks    Status  Partially Met      PT LONG TERM GOAL #2   Title  Patient will improve right shoulder flexion AROM to 110 degrees or greater to perform functional activities.    Time  12    Period  Weeks    Status  On-going      PT LONG TERM GOAL #3   Title  Patient will improve right shoulder ER AROM to 50+ degrees in order to don/doff apparel.    Time  12    Period  Weeks    Status  On-going      PT LONG TERM GOAL #4   Title  Patient will improve right shoulder MMT to 4-4+/5 in all planes to improve stability during functional activities.    Time  12    Period  Weeks    Status  On-going            Plan - 02/18/18 1154    Clinical Impression Statement  Patient tolerated today's treatment well even though he arrived with reports of pain which he was unable to rate. Patient still limited with antigravity flexion with pulleys and was frequenty VCd to fully flex shoulder with table slides as well as UE ranger. Patient now able to complete protraction and flexion AAROM  in supine without assistance. Patient still reporting discomfort with end range ER with AAROM exercise. No complaints of discomfort with PROM in flexion or ER. Normal modalities response noted following removal of the modalities.    Rehab Potential  Fair    Clinical Impairments Affecting Rehab Potential  poor recall of precautions, DM, slow healing process    PT Frequency  2x / week    PT Duration  12 weeks    PT Treatment/Interventions  Electrical Stimulation;Passive range of motion;Manual techniques;ADLs/Self Care Home Management;Iontophoresis '4mg'$ /ml Dexamethasone;Therapeutic activities;Therapeutic exercise;Patient/family education;Moist Heat;Ultrasound    PT Next Visit Plan  cont Per protocol, see media. MHP and e-stim for pain relief    Consulted and Agree with Plan of Care  Patient       Patient will benefit from skilled therapeutic intervention in order to improve the following deficits and impairments:  Pain, Decreased activity tolerance, Decreased endurance, Decreased range of motion, Decreased strength, Impaired UE functional use, Decreased knowledge of precautions  Visit Diagnosis: Stiffness of right shoulder, not elsewhere classified  Chronic right shoulder pain  Muscle weakness (generalized)     Problem List Patient Active Problem List   Diagnosis Date Noted  . CHRONIC RHINITIS 10/08/2009  . HYPERLIPIDEMIA 05/31/2009  . HYPERTENSION 05/31/2009  . COUGH 05/31/2009  . RETINAL DETACHMENT, BILATERAL, HX OF 05/31/2009   PHYSICAL THERAPY DISCHARGE SUMMARY  Visits from Start of Care: 17  Current functional level related to goals / functional outcomes: See above   Remaining deficits: See goals   Education / Equipment: HEP Plan: Patient agrees to discharge.  Patient goals were not met. Patient is being discharged due to not returning since the last visit.  ?????    Gabriela Eves, PT, DPT     Standley Brooking, PTA 02/18/2018, 11:58 AM  [facility] [address] [place], [place], [address] Phone: [phone]   Fax:  [phone]  Name: Casey Cooley  MRN: [id number] Date of Birth: Jan 03, 1933

## 2018-02-22 ENCOUNTER — Encounter: Payer: Medicare Other | Admitting: Physical Therapy

## 2018-02-25 ENCOUNTER — Encounter (INDEPENDENT_AMBULATORY_CARE_PROVIDER_SITE_OTHER): Payer: Self-pay | Admitting: Orthopaedic Surgery

## 2018-02-25 ENCOUNTER — Encounter: Payer: Medicare Other | Admitting: Physical Therapy

## 2018-02-25 ENCOUNTER — Ambulatory Visit (INDEPENDENT_AMBULATORY_CARE_PROVIDER_SITE_OTHER): Payer: Medicare Other | Admitting: Orthopaedic Surgery

## 2018-02-25 ENCOUNTER — Ambulatory Visit (INDEPENDENT_AMBULATORY_CARE_PROVIDER_SITE_OTHER): Payer: Self-pay

## 2018-02-25 VITALS — BP 118/73 | HR 70 | Ht 73.0 in | Wt 230.0 lb

## 2018-02-25 DIAGNOSIS — M545 Low back pain, unspecified: Secondary | ICD-10-CM

## 2018-02-25 DIAGNOSIS — G8929 Other chronic pain: Secondary | ICD-10-CM | POA: Diagnosis not present

## 2018-02-28 ENCOUNTER — Encounter (INDEPENDENT_AMBULATORY_CARE_PROVIDER_SITE_OTHER): Payer: Self-pay | Admitting: Orthopaedic Surgery

## 2018-02-28 NOTE — Progress Notes (Signed)
Office Visit Note   Patient: Casey Cooley           Date of Birth: [birth year]           MRN: [id number] Visit Date: 02/25/2018              Requested by: Dione Housekeeper, MD [address] [place], [place] [address] PCP: Dione Housekeeper, MD   Assessment & Plan: Visit Diagnoses:  1. Chronic left-sided low back pain without sciatica     Plan: We will proceed with lumbar MRI scan for patient's claudication symptoms.  Office follow-up after MRI for review.  Follow-Up Instructions: No follow-ups on file.   Orders:  Orders Placed This Encounter  Procedures  . XR Lumbar Spine 2-3 Views   No orders of the defined types were placed in this encounter.     Procedures: No procedures performed   Clinical Data: No additional findings.   Subjective: Chief Complaint  Patient presents with  . Lower Back - Pain    HPI 84 year old male with low back pain difficulty standing and increased pain when he attempts to ambulate even short distances.  Sometimes pain wakes up at night and he has weakness in his legs with progressive ambulation.  He is tried Tylenol without relief.. Patient is able to ambulate in the store leaning on a grocery cart.  He gets relief with sitting.  Patient does not smoke or drink.  No associated bowel or bladder symptoms no fever or chills.  Review of Systems 14 point review of systems positive for acid reflux, cataract surgery, rotator cuff repair of a massive tear 2018 by Dr. Case.  Previous shoulder surgery left.  History of previous diabetic coma.  Hyperlipidemia and hypertension.   Objective: Vital Signs: BP 118/73   Pulse 70   Ht 6\' 1"  (1.854 m)   Wt 230 lb (104.3 kg)   BMI 30.34 kg/m   Physical Exam  Constitutional: He is oriented to person, place, and time. He appears well-developed and well-nourished.  HENT:  Head: Normocephalic and atraumatic.  Eyes: Pupils are equal, round, and reactive to light. EOM are normal.  Neck: No tracheal  deviation present. No thyromegaly present.  Cardiovascular: Normal rate.  Pulmonary/Chest: Effort normal. He has no wheezes.  Abdominal: Soft. Bowel sounds are normal.  Neurological: He is alert and oriented to person, place, and time.  Skin: Skin is warm and dry. Capillary refill takes less than 2 seconds.  Psychiatric: He has a normal mood and affect. His behavior is normal. Judgment and thought content normal.    Ortho Exam patient has negative logroll the hips.  Quads are strong.  Anterior tib gastrocsoleus is strong distal pulses are palpable.  Knee and ankle jerk are 1+ and symmetrical.  Knees reach full extension mild crepitus.  No hip flexion or quad or hamstring weakness. Specialty Comments:  No specialty comments available.  Imaging: No results found.   PMFS History: Patient Active Problem List   Diagnosis Date Noted  . CHRONIC RHINITIS 10/08/2009  . HYPERLIPIDEMIA 05/31/2009  . HYPERTENSION 05/31/2009  . COUGH 05/31/2009  . RETINAL DETACHMENT, BILATERAL, HX OF 05/31/2009   History reviewed. No pertinent past medical history.  History reviewed. No pertinent family history.  History reviewed. No pertinent surgical history. Social History   Occupational History  . Not on file  Tobacco Use  . Smoking status: Never Smoker  . Smokeless tobacco: Never Used  Substance and Sexual Activity  . Alcohol use: Not on  file  . Drug use: Not on file  . Sexual activity: Not on file

## 2018-03-04 ENCOUNTER — Encounter: Payer: Medicare Other | Admitting: *Deleted

## 2018-03-04 ENCOUNTER — Ambulatory Visit (INDEPENDENT_AMBULATORY_CARE_PROVIDER_SITE_OTHER): Payer: Medicare Other | Admitting: Orthopaedic Surgery

## 2018-03-25 ENCOUNTER — Encounter (INDEPENDENT_AMBULATORY_CARE_PROVIDER_SITE_OTHER): Payer: Self-pay | Admitting: Orthopaedic Surgery

## 2018-03-25 ENCOUNTER — Ambulatory Visit (INDEPENDENT_AMBULATORY_CARE_PROVIDER_SITE_OTHER): Payer: Medicare Other | Admitting: Orthopaedic Surgery

## 2018-03-25 VITALS — BP 108/60 | HR 72 | Ht 73.0 in | Wt 220.0 lb

## 2018-03-25 DIAGNOSIS — G8929 Other chronic pain: Secondary | ICD-10-CM | POA: Diagnosis not present

## 2018-03-25 DIAGNOSIS — M545 Low back pain, unspecified: Secondary | ICD-10-CM

## 2018-03-25 DIAGNOSIS — M47816 Spondylosis without myelopathy or radiculopathy, lumbar region: Secondary | ICD-10-CM | POA: Diagnosis not present

## 2018-03-25 NOTE — Progress Notes (Addendum)
Office Visit Note   Patient: Casey Cooley           Date of Birth: [birth year]           MRN: [id number] Visit Date: 03/25/2018              Requested by: Dione Housekeeper, MD [address] [place], [place] [address] PCP: Dione Housekeeper, MD   Assessment & Plan: Visit Diagnoses:  1. Chronic left-sided low back pain without sciatica   2. Facet degeneration of lumbar region        Left L5-S1 facet joint.   Plan: We will set him up for some course of physical therapy does not respond to therapy we will consider left L5-S1 facet injection under fluoroscopy.  He is neurologically intact.  Recheck 5 weeks.  Follow-Up Instructions: No follow-ups on file.   Orders:  Orders Placed This Encounter  Procedures  . Ambulatory referral to Physical Therapy   No orders of the defined types were placed in this encounter.     Procedures: No procedures performed   Clinical Data: No additional findings.   Subjective: Chief Complaint  Patient presents with  . Lower Back - Pain, Follow-up    MRI review    HPI 84 year old male returns with ongoing problems with left paralumbar pain with difficulty getting from sitting to standing and increased pain with ambulation of short distances.  Occasionally it wakes him up at night.  Review of Systems positive hyperlipidemia hypertension, chronic rhinitis, cough, history of retinal detachment bilateral, low back pain current.  14 point review of systems otherwise negative.   Objective: Vital Signs: BP 108/60   Pulse 72   Ht 6\' 1"  (1.854 m)   Wt 220 lb (99.8 kg)   BMI 29.03 kg/m   Physical Exam  Constitutional: He is oriented to person, place, and time. He appears well-developed and well-nourished.  HENT:  Head: Normocephalic and atraumatic.  Eyes: Pupils are equal, round, and reactive to light. EOM are normal.  Neck: No tracheal deviation present. No thyromegaly present.  Cardiovascular: Normal rate.  Pulmonary/Chest: Effort  normal. He has no wheezes.  Abdominal: Soft. Bowel sounds are normal.  Neurological: He is alert and oriented to person, place, and time.  Skin: Skin is warm and dry. Capillary refill takes less than 2 seconds.  Psychiatric: He has a normal mood and affect. His behavior is normal. Judgment and thought content normal.    Ortho Exam patient has some pain with lumbar extension.  Tenderness at the left paralumbar region and left superior gluteal region.  Minimal sciatic notch tenderness no trochanteric bursal tenderness normal hip range of motion negative logroll.  Knee and ankle jerk are 1+ and symmetrical negative straight leg raising 90 degrees.  Specialty Comments:  No specialty comments available.  Imaging: Lumbar MRI scan 03/11/2018 impression is transitional lumbosacral anatomy.  Multilevel disc degeneration with moderate neuroforaminal narrowing on the left at L3 and right at L4.  No central spinal stenosis.  Severe right and moderate left facet arthrosis at L5-S1 which may be a source of local low back pain.  PMFS History: Patient Active Problem List   Diagnosis Date Noted  . CHRONIC RHINITIS 10/08/2009  . HYPERLIPIDEMIA 05/31/2009  . HYPERTENSION 05/31/2009  . COUGH 05/31/2009  . RETINAL DETACHMENT, BILATERAL, HX OF 05/31/2009   No past medical history on file.  No family history on file.  No past surgical history on file. Social History   Occupational History  .  Not on file  Tobacco Use  . Smoking status: Never Smoker  . Smokeless tobacco: Never Used  Substance and Sexual Activity  . Alcohol use: Not on file  . Drug use: Not on file  . Sexual activity: Not on file

## 2018-03-28 ENCOUNTER — Encounter (INDEPENDENT_AMBULATORY_CARE_PROVIDER_SITE_OTHER): Payer: Self-pay | Admitting: Orthopaedic Surgery

## 2018-03-28 DIAGNOSIS — M47816 Spondylosis without myelopathy or radiculopathy, lumbar region: Secondary | ICD-10-CM | POA: Insufficient documentation

## 2018-03-31 ENCOUNTER — Ambulatory Visit: Payer: Medicare Other | Admitting: Physical Therapy

## 2018-04-01 ENCOUNTER — Telehealth (INDEPENDENT_AMBULATORY_CARE_PROVIDER_SITE_OTHER): Payer: Self-pay | Admitting: Radiology

## 2018-04-01 NOTE — Telephone Encounter (Signed)
Patient's wife called [place] office and stated referral needed to be sent to OP PT at [facility].  Referral was entered last week. I called and spoke with outpatient therapy. They have everything that they need and the patient has an appointment scheduled tomorrow. He needs to arrive by 1020. I called the patient and advised.

## 2018-04-02 ENCOUNTER — Ambulatory Visit: Payer: Medicare Other | Admitting: Physical Therapy

## 2018-04-02 ENCOUNTER — Encounter: Payer: Medicare Other | Admitting: *Deleted

## 2018-04-13 ENCOUNTER — Ambulatory Visit: Payer: Medicare Other | Admitting: Physical Therapy

## 2018-04-22 ENCOUNTER — Encounter (INDEPENDENT_AMBULATORY_CARE_PROVIDER_SITE_OTHER): Payer: Self-pay | Admitting: Orthopaedic Surgery

## 2018-04-22 ENCOUNTER — Ambulatory Visit (INDEPENDENT_AMBULATORY_CARE_PROVIDER_SITE_OTHER): Payer: Medicare Other | Admitting: Orthopaedic Surgery

## 2018-04-22 VITALS — BP 141/79 | HR 74 | Ht 73.0 in | Wt 220.0 lb

## 2018-04-22 DIAGNOSIS — M47816 Spondylosis without myelopathy or radiculopathy, lumbar region: Secondary | ICD-10-CM | POA: Diagnosis not present

## 2018-04-22 NOTE — Progress Notes (Signed)
Office Visit Note   Patient: Casey Cooley           Date of Birth: 24-May-1933           MRN: [id number] Visit Date: 04/22/2018              Requested by: Dione Housekeeper, MD [address] [place], [place] [address] PCP: Dione Housekeeper, MD   Assessment & Plan: Visit Diagnoses:  1. Facet degeneration of lumbar region     Plan: Patient has a lumbar facet degenerative changes at L5-S1 severe on the right moderate on the left.  Some neuroforaminal narrowing at L3 on the left and L4 on the right but no severe compressive areas.  I discussed with him he needs to use his walker to get up and walk and not spend all day long sitting and watching TV.  He needs to use his walker when he walks which will help prevent him from falling.  Once he walks and gets tired he can sit and rest and repeated the following day.  His wife is with him today and she agrees to go to the track at the church and walk with him like they used to.  He can return if he has increased problems.  Follow-Up Instructions: No follow-ups on file.   Orders:  No orders of the defined types were placed in this encounter.  No orders of the defined types were placed in this encounter.     Procedures: No procedures performed   Clinical Data: No additional findings.   Subjective: Chief Complaint  Patient presents with  . Lower Back - Pain, Follow-up    HPI 84 year old male returns he states he did not go to therapy when he showed up they stated that they did not have an order for it, but phone calls were made order was actually sent in he did not keep appointment.  He states he likes to eat he sits and watches TV but really has not done much else.  He is amatory with a 4 pronged cane.  He states the does not do hardly any walking and is concerned he might have a fall.  Patient is here with his wife and they got married 1964.  The used to walk together.  Local church has a track that they can use.  Review of Systems  14 point review of systems unchanged from 03/25/2018 office visit.  Patient used to lift weights up to his mid 9s when he had retinal detachment problems and was told he had stopped lifting weights.   Objective: Vital Signs: BP (!) 141/79   Pulse 74   Ht 6\' 1"  (1.854 m)   Wt 220 lb (99.8 kg)   BMI 29.03 kg/m   Physical Exam  Constitutional: He is oriented to person, place, and time. He appears well-developed and well-nourished.  HENT:  Head: Normocephalic and atraumatic.  Eyes: Pupils are equal, round, and reactive to light. EOM are normal.  Neck: No tracheal deviation present. No thyromegaly present.  Cardiovascular: Normal rate.  Pulmonary/Chest: Effort normal. He has no wheezes.  Abdominal: Soft. Bowel sounds are normal.  Neurological: He is alert and oriented to person, place, and time.  Skin: Skin is warm and dry. Capillary refill takes less than 2 seconds.  Psychiatric: He has a normal mood and affect. His behavior is normal. Judgment and thought content normal.    Ortho Exam patient ambulates with short stride step.  He has a 4 pronged  cane but can ambulate without it.  Slow to get on and off the exam table.  Anterior tib gastrocsoleus is intact.  Specialty Comments:  No specialty comments available.  Imaging: No results found.   PMFS History: Patient Active Problem List   Diagnosis Date Noted  . Facet degeneration of lumbar region 03/28/2018  . CHRONIC RHINITIS 10/08/2009  . HYPERLIPIDEMIA 05/31/2009  . HYPERTENSION 05/31/2009  . COUGH 05/31/2009  . RETINAL DETACHMENT, BILATERAL, HX OF 05/31/2009   No past medical history on file.  No family history on file.  No past surgical history on file. Social History   Occupational History  . Not on file  Tobacco Use  . Smoking status: Never Smoker  . Smokeless tobacco: Never Used  Substance and Sexual Activity  . Alcohol use: Not on file  . Drug use: Not on file  . Sexual activity: Not on file

## 2018-05-03 ENCOUNTER — Ambulatory Visit: Payer: Medicare Other | Admitting: Physical Therapy

## 2018-05-05 ENCOUNTER — Encounter: Payer: Self-pay | Admitting: Physical Therapy

## 2018-05-05 ENCOUNTER — Other Ambulatory Visit: Payer: Self-pay

## 2018-05-05 ENCOUNTER — Ambulatory Visit: Payer: Medicare Other | Attending: Orthopedic Surgery | Admitting: Physical Therapy

## 2018-05-05 DIAGNOSIS — M25511 Pain in right shoulder: Secondary | ICD-10-CM | POA: Diagnosis present

## 2018-05-05 DIAGNOSIS — M6281 Muscle weakness (generalized): Secondary | ICD-10-CM | POA: Diagnosis present

## 2018-05-05 DIAGNOSIS — M25611 Stiffness of right shoulder, not elsewhere classified: Secondary | ICD-10-CM | POA: Diagnosis present

## 2018-05-05 DIAGNOSIS — G8929 Other chronic pain: Secondary | ICD-10-CM | POA: Diagnosis present

## 2018-05-05 NOTE — Therapy (Signed)
[facility] [place], [place], [address] Phone: [phone]   Fax:  [phone]  Physical Therapy Evaluation  Patient Details  Name: Casey Cooley MRN: [id number] Date of Birth: [birth year] Referring Provider: Remo Lipps Case   Encounter Date: 05/05/2018  PT End of Session - 05/05/18 1048    Visit Number  1    Number of Visits  12    Date for PT Re-Evaluation  06/16/18    Authorization Type  Progress note every 10th visit    PT Start Time  1030    PT Stop Time  1101    PT Time Calculation (min)  31 min    Activity Tolerance  Patient tolerated treatment well    Behavior During Therapy  [facility] for tasks assessed/performed       History reviewed. No pertinent past medical history.  History reviewed. No pertinent surgical history.  There were no vitals filed for this visit.   Subjective Assessment - 05/05/18 1700    Subjective  Patient arrives to physical therapy with reports of right arm weakness and decreased range of motion secondary to right shoulder rotator cuff repair on 10/30/17. Patient reports difficulties with ADLs especially washing and dressing. Patient has not been able to perform any yard work due to decreased strength and loss of motion. Patient denies pain at rest but can have pain up to 6/10 with increased activity and movement of the right shoulder and sleeping on his right shoulder. Patient's goals are to improve arm movement and improve strength.     Pertinent History  DM    Limitations  Lifting;House hold activities    Diagnostic tests  X-Ray, MRI    Patient Stated Goals  build up strength and do yard work on my own    Currently in Pain?  No/denies         [facility] PT Assessment - 05/05/18 0001      Assessment   Medical Diagnosis  R RTC repair    Onset Date/Surgical Date  10/30/17    Hand Dominance  Right    Next MD Visit  November 2019    Prior Therapy  yes      Precautions   Precautions  None    Precaution Comments  Previous right RTC repair      Restrictions   Weight Bearing Restrictions  No      Balance Screen   Has the patient fallen in the past 6 months  No    Has the patient had a decrease in activity level because of a fear of falling?   Yes    Is the patient reluctant to leave their home because of a fear of falling?   No      Home Environment   Living Environment  Private residence    Living Arrangements  Spouse/significant other      Prior Function   Level of Independence  Needs assistance with ADLs;Needs assistance with homemaking;Independent with gait      ROM / Strength   AROM / PROM / Strength  AROM;PROM;Strength      AROM   AROM Assessment Site  Shoulder    Right/Left Shoulder  Right    Right Shoulder Flexion  85 Degrees    Right Shoulder ABduction  72 Degrees    Right Shoulder Internal Rotation  30 Degrees    Right Shoulder External Rotation  6 Degrees      PROM   PROM Assessment Site  Shoulder    Right/Left Shoulder  Right    Right Shoulder Flexion  103 Degrees    Right Shoulder ABduction  89 Degrees    Right Shoulder Internal Rotation  53 Degrees    Right Shoulder External Rotation  17 Degrees      Strength   Overall Strength Comments  no full AROM in any plane of motion    Strength Assessment Site  Shoulder    Right/Left Shoulder  Right    Right Shoulder Flexion  2+/5    Right Shoulder ABduction  2+/5    Right Shoulder Internal Rotation  2+/5    Right Shoulder External Rotation  2+/5      Palpation   Palpation comment  no tenderness to palpation of R shoulder      Transfers   Comments  able to transfer from sit <->supine with supervision      Ambulation/Gait   Gait Comments  ambulates with 4 small base quad cane                Objective measurements completed on examination: See above findings.              PT Education - 05/05/18 1107    Education provided  Yes    Education Details  AAROM flexion, table  circles    Person(s) Educated  Patient    Methods  Explanation;Demonstration;Verbal cues;Handout    Comprehension  Verbalized understanding;Returned demonstration          PT Long Term Goals - 05/05/18 1705      PT LONG TERM GOAL #1   Title  Patient will be independent with HEP    Time  6    Period  Weeks    Status  New      PT LONG TERM GOAL #2   Title  Patient will improve right shoulder flexion AROM to 110 degrees or greater to perform functional activities.    Time  6    Period  Weeks    Status  New      PT LONG TERM GOAL #3   Title  Patient will improve right shoulder ER AROM to 50+ degrees in order to don/doff apparel.    Time  6    Period  Weeks      PT LONG TERM GOAL #4   Title  Patient will improve right shoulder MMT to 3+/5 or greater in all planes to improve stability during functional activities.    Time  6    Period  Weeks    Status  New      PT LONG TERM GOAL #5   Title  Patient will demonstrate functional IR to C7 to improve ability to wash and comb hair.    Time  6    Period  Weeks    Status  New             Plan - 05/05/18 1703    Clinical Impression Statement  Patient is an 84 year old male who presents to physical therapy with decreased right shoulder AROM and PROM secondary to right shoulder rotator cuff repair on 10/30/17. Patient noted with poor plus right shoulder MMT secondary to decreased available ROM. Patient denied any tenderness upon palpation of the right shoulder. Patient is able to perform all transfers with supervision. Patient would benefit from skilled physical therapy to address deficits and address patient's goals.    Clinical Presentation  Stable    Clinical Decision  Making  Low    Rehab Potential  Fair    PT Frequency  2x / week    PT Duration  6 weeks    PT Treatment/Interventions  Electrical Stimulation;Passive range of motion;Manual techniques;ADLs/Self Care Home Management;Iontophoresis 4mg /ml Dexamethasone;Therapeutic  activities;Therapeutic exercise;Patient/family education;Moist Heat;Ultrasound;Vasopneumatic Device    PT Next Visit Plan  review HEP, AAROM, PROM, Isometrics and gentle strengthening, modalities PRN. for pain relief.    PT Home Exercise Plan  see patient education section    Consulted and Agree with Plan of Care  Patient       Patient will benefit from skilled therapeutic intervention in order to improve the following deficits and impairments:  Pain, Decreased activity tolerance, Decreased endurance, Decreased range of motion, Decreased strength, Impaired UE functional use  Visit Diagnosis: Stiffness of right shoulder, not elsewhere classified  Muscle weakness (generalized)     Problem List Patient Active Problem List   Diagnosis Date Noted  . Facet degeneration of lumbar region 03/28/2018  . CHRONIC RHINITIS 10/08/2009  . HYPERLIPIDEMIA 05/31/2009  . HYPERTENSION 05/31/2009  . COUGH 05/31/2009  . RETINAL DETACHMENT, BILATERAL, HX OF 05/31/2009    Gabriela Eves 05/05/2018, 5:08 PM  [facility] [address] [place], [place], [address] Phone: [phone]   Fax:  [phone]  Name: Casey Cooley MRN: [id number] Date of Birth: [birth year]

## 2018-05-11 ENCOUNTER — Ambulatory Visit: Payer: Medicare Other | Attending: Orthopedic Surgery | Admitting: Physical Therapy

## 2018-05-11 ENCOUNTER — Encounter: Payer: Self-pay | Admitting: Physical Therapy

## 2018-05-11 DIAGNOSIS — G8929 Other chronic pain: Secondary | ICD-10-CM | POA: Insufficient documentation

## 2018-05-11 DIAGNOSIS — M25511 Pain in right shoulder: Secondary | ICD-10-CM | POA: Diagnosis present

## 2018-05-11 DIAGNOSIS — M6281 Muscle weakness (generalized): Secondary | ICD-10-CM | POA: Insufficient documentation

## 2018-05-11 DIAGNOSIS — M25611 Stiffness of right shoulder, not elsewhere classified: Secondary | ICD-10-CM

## 2018-05-11 NOTE — Therapy (Signed)
[facility] [place], [place], [address] Phone: [phone]   Fax:  [phone]  Physical Therapy Treatment  Patient Details  Name: Casey Cooley MRN: [id number] Date of Birth: 05/19/1933 Referring Provider (PT): Remo Lipps Case, MD   Encounter Date: 05/11/2018  PT End of Session - 05/11/18 1034    Visit Number  2    Number of Visits  12    Date for PT Re-Evaluation  06/16/18    Authorization Type  Progress note every 10th visit    PT Start Time  1030    PT Stop Time  1114    PT Time Calculation (min)  44 min    Activity Tolerance  Patient tolerated treatment well    Behavior During Therapy  [facility] for tasks assessed/performed       History reviewed. No pertinent past medical history.  History reviewed. No pertinent surgical history.  There were no vitals filed for this visit.  Subjective Assessment - 05/11/18 1035    Subjective  Patient arrives with no reports of pain, still continues to need wife to assist with dressing. Patient reports he does not do his HEP as prescribed.    Pertinent History  DM    Limitations  Lifting;House hold activities    Diagnostic tests  X-Ray, MRI    Patient Stated Goals  build up strength and do yard work on my own    Currently in Pain?  No/denies         [facility] PT Assessment - 05/11/18 0001      Assessment   Medical Diagnosis  R RTC repair    Referring Provider (PT)  Remo Lipps Case, MD    Onset Date/Surgical Date  10/30/17    Hand Dominance  Right    Next MD Visit  November 2019    Prior Therapy  yes      Precautions   Precautions  None    Precaution Comments  Previous right RTC repair      Restrictions   Weight Bearing Restrictions  No                   [facility] Adult PT Treatment/Exercise - 05/11/18 0001      Exercises   Exercises  Shoulder      Shoulder Exercises: Seated   Flexion  AROM;Right;AAROM   2 minutes     Shoulder Exercises: Pulleys   Flexion  5  minutes      Shoulder Exercises: ROM/Strengthening   Ranger  seated ranger, circles clockwise 2 minutes, flexion 2 minutes    Wall Wash  2 minutes circles, 2 minutes flexion      Manual Therapy   Manual Therapy  Joint mobilization;Passive ROM    Joint Mobilization  Grade III inferior and posterior mobilization to improve ROM    Passive ROM  PROM in flexion, IR, ER, and abd with gentle holds at end range to improve ROM, oscillations intermittently to prevent muscle guarding                  PT Long Term Goals - 05/05/18 1705      PT LONG TERM GOAL #1   Title  Patient will be independent with HEP    Time  6    Period  Weeks    Status  New      PT LONG TERM GOAL #2   Title  Patient will improve right shoulder flexion AROM to 110 degrees or  greater to perform functional activities.    Time  6    Period  Weeks    Status  New      PT LONG TERM GOAL #3   Title  Patient will improve right shoulder ER AROM to 50+ degrees in order to don/doff apparel.    Time  6    Period  Weeks      PT LONG TERM GOAL #4   Title  Patient will improve right shoulder MMT to 3+/5 or greater in all planes to improve stability during functional activities.    Time  6    Period  Weeks    Status  New      PT LONG TERM GOAL #5   Title  Patient will demonstrate functional IR to C7 to improve ability to wash and comb hair.    Time  6    Period  Weeks    Status  New            Plan - 05/11/18 1117    Clinical Impression Statement  Patient was able to tolerate treatment well but required multiple verbal and cuing for proper form and technique of all exercises. Patient was not able to demonstrate with proper form without cuing. Patient noted with increased fatigue with wall washes and required intermittent assistance from unaffected UE to continue exercise. Patient noted with increased muscle guarding with PROM ER and noted with decreased guarding after oscillations. Patient re-educated on the  importance of performing HEP to optimize gains and to improve ROM to perform functional activities. Patient reported understanding. No modalities performed due to no reports of pain.    Clinical Presentation  Stable    Clinical Decision Making  Low    Rehab Potential  Fair    PT Frequency  2x / week    PT Duration  6 weeks    PT Treatment/Interventions  Electrical Stimulation;Passive range of motion;Manual techniques;ADLs/Self Care Home Management;Iontophoresis 4mg /ml Dexamethasone;Therapeutic activities;Therapeutic exercise;Patient/family education;Moist Heat;Ultrasound;Vasopneumatic Device    PT Next Visit Plan  review HEP, AAROM, PROM, Isometrics and gentle strengthening, modalities PRN. for pain relief.    Consulted and Agree with Plan of Care  Patient       Patient will benefit from skilled therapeutic intervention in order to improve the following deficits and impairments:  Pain, Decreased activity tolerance, Decreased endurance, Decreased range of motion, Decreased strength, Impaired UE functional use  Visit Diagnosis: Stiffness of right shoulder, not elsewhere classified  Muscle weakness (generalized)     Problem List Patient Active Problem List   Diagnosis Date Noted  . Facet degeneration of lumbar region 03/28/2018  . CHRONIC RHINITIS 10/08/2009  . HYPERLIPIDEMIA 05/31/2009  . HYPERTENSION 05/31/2009  . COUGH 05/31/2009  . RETINAL DETACHMENT, BILATERAL, HX OF 05/31/2009   Gabriela Eves, PT, DPT 05/11/2018, 11:32 AM  [facility] [address] [place], [place], [address] Phone: [phone]   Fax:  [phone]  Name: Casey Cooley MRN: [id number] Date of Birth: Jan 02, 1933

## 2018-05-13 ENCOUNTER — Encounter: Payer: Self-pay | Admitting: Physical Therapy

## 2018-05-13 ENCOUNTER — Ambulatory Visit: Payer: Medicare Other | Admitting: Physical Therapy

## 2018-05-13 DIAGNOSIS — M25611 Stiffness of right shoulder, not elsewhere classified: Secondary | ICD-10-CM | POA: Diagnosis not present

## 2018-05-13 DIAGNOSIS — M25511 Pain in right shoulder: Secondary | ICD-10-CM

## 2018-05-13 DIAGNOSIS — M6281 Muscle weakness (generalized): Secondary | ICD-10-CM

## 2018-05-13 DIAGNOSIS — G8929 Other chronic pain: Secondary | ICD-10-CM

## 2018-05-13 NOTE — Therapy (Signed)
[facility] [place], [place], [address] Phone: [phone]   Fax:  [phone]  Physical Therapy Treatment  Patient Details  Name: Casey Cooley MRN: [id number] Date of Birth: 1932/11/26 Referring Provider (PT): Remo Lipps Case, MD   Encounter Date: 05/13/2018  PT End of Session - 05/13/18 1033    Visit Number  3    Number of Visits  12    Date for PT Re-Evaluation  06/16/18    Authorization Type  Progress note every 10th visit    PT Start Time  1032    PT Stop Time  1113    PT Time Calculation (min)  41 min    Activity Tolerance  Patient tolerated treatment well    Behavior During Therapy  [facility] for tasks assessed/performed       History reviewed. No pertinent past medical history.  History reviewed. No pertinent surgical history.  There were no vitals filed for this visit.  Subjective Assessment - 05/13/18 1032    Subjective  Denies any pain.    Pertinent History  DM    Limitations  Lifting;House hold activities    Diagnostic tests  X-Ray, MRI    Patient Stated Goals  build up strength and do yard work on my own    Currently in Pain?  No/denies         [facility] PT Assessment - 05/13/18 0001      Assessment   Medical Diagnosis  R RTC repair    Onset Date/Surgical Date  10/30/17    Hand Dominance  Right    Next MD Visit  November 2019    Prior Therapy  yes      Precautions   Precautions  None    Precaution Comments  Previous right RTC repair      Restrictions   Weight Bearing Restrictions  No                   [facility] Adult PT Treatment/Exercise - 05/13/18 0001      Exercises   Exercises  Shoulder      Shoulder Exercises: Supine   Protraction  AROM;Left;Other (comment)   x30 reps   Flexion  AROM;Right;Other (comment)   x30 reps     Shoulder Exercises: Seated   Other Seated Exercises  UE ranger into flexion, circles      Shoulder Exercises: Pulleys   Flexion  5 minutes      Shoulder Exercises: ROM/Strengthening   UBE (Upper Arm Bike)  120 RPM x6 min      Manual Therapy   Manual Therapy  Passive ROM    Passive ROM  PROM of R shoulder into flexion, ER, IR to improve ROM                  PT Long Term Goals - 05/05/18 1705      PT LONG TERM GOAL #1   Title  Patient will be independent with HEP    Time  6    Period  Weeks    Status  New      PT LONG TERM GOAL #2   Title  Patient will improve right shoulder flexion AROM to 110 degrees or greater to perform functional activities.    Time  6    Period  Weeks    Status  New      PT LONG TERM GOAL #3   Title  Patient will improve right shoulder ER AROM to  50+ degrees in order to don/doff apparel.    Time  6    Period  Weeks      PT LONG TERM GOAL #4   Title  Patient will improve right shoulder MMT to 3+/5 or greater in all planes to improve stability during functional activities.    Time  6    Period  Weeks    Status  New      PT LONG TERM GOAL #5   Title  Patient will demonstrate functional IR to C7 to improve ability to wash and comb hair.    Time  6    Period  Weeks    Status  New            Plan - 05/13/18 1132    Clinical Impression Statement  Patient tolerated today's treatment well but requires VCs to keep patient focused on exercises. Patient encouraged to complete exercises to end range as well especially into flexion. Patient re-educated again today of the importance of completing the HEP as directed to optimize results of PT. Firm end feels and smooth arc of motion noted with PROM of R shoulder. Patient more limited passively with ER.     Rehab Potential  Fair    PT Frequency  2x / week    PT Duration  6 weeks    PT Treatment/Interventions  Electrical Stimulation;Passive range of motion;Manual techniques;ADLs/Self Care Home Management;Iontophoresis 4mg /ml Dexamethasone;Therapeutic activities;Therapeutic exercise;Patient/family education;Moist Heat;Ultrasound;Vasopneumatic  Device    PT Next Visit Plan  review HEP, AAROM, PROM, Isometrics and gentle strengthening, modalities PRN. for pain relief.    PT Home Exercise Plan  see patient education section    Consulted and Agree with Plan of Care  Patient       Patient will benefit from skilled therapeutic intervention in order to improve the following deficits and impairments:  Pain, Decreased activity tolerance, Decreased endurance, Decreased range of motion, Decreased strength, Impaired UE functional use  Visit Diagnosis: Stiffness of right shoulder, not elsewhere classified  Muscle weakness (generalized)  Chronic right shoulder pain     Problem List Patient Active Problem List   Diagnosis Date Noted  . Facet degeneration of lumbar region 03/28/2018  . CHRONIC RHINITIS 10/08/2009  . HYPERLIPIDEMIA 05/31/2009  . HYPERTENSION 05/31/2009  . COUGH 05/31/2009  . RETINAL DETACHMENT, BILATERAL, HX OF 05/31/2009    Standley Brooking, PTA 05/13/2018, 11:47 AM  [facility] [address] [place], [place], [address] Phone: [phone]   Fax:  [phone]  Name: Casey Cooley MRN: [id number] Date of Birth: 12-10-1932

## 2018-05-18 ENCOUNTER — Encounter: Payer: Self-pay | Admitting: Physical Therapy

## 2018-05-18 ENCOUNTER — Ambulatory Visit: Payer: Medicare Other | Admitting: Physical Therapy

## 2018-05-18 DIAGNOSIS — M25611 Stiffness of right shoulder, not elsewhere classified: Secondary | ICD-10-CM | POA: Diagnosis not present

## 2018-05-18 DIAGNOSIS — M25511 Pain in right shoulder: Secondary | ICD-10-CM

## 2018-05-18 DIAGNOSIS — M6281 Muscle weakness (generalized): Secondary | ICD-10-CM

## 2018-05-18 DIAGNOSIS — G8929 Other chronic pain: Secondary | ICD-10-CM

## 2018-05-18 NOTE — Therapy (Signed)
[facility] [place], [place], [address] Phone: [phone]   Fax:  [phone]  Physical Therapy Treatment  Patient Details  Name: Casey Cooley MRN: [id number] Date of Birth: 02-23-1933 Referring Provider (PT): Remo Lipps Case, MD   Encounter Date: 05/18/2018  PT End of Session - 05/18/18 1040    Visit Number  4    Number of Visits  12    Date for PT Re-Evaluation  06/16/18    Authorization Type  Progress note every 10th visit    PT Start Time  1032    PT Stop Time  1115    PT Time Calculation (min)  43 min    Activity Tolerance  Patient tolerated treatment well    Behavior During Therapy  [facility] for tasks assessed/performed       History reviewed. No pertinent past medical history.  History reviewed. No pertinent surgical history.  There were no vitals filed for this visit.  Subjective Assessment - 05/18/18 1033    Subjective  Reports that his shoulder is okay today. Reports he hasn't been doing yard work because he is afraid he will fall.    Pertinent History  DM    Limitations  Lifting;House hold activities    Diagnostic tests  X-Ray, MRI    Patient Stated Goals  build up strength and do yard work on my own    Currently in Pain?  Other (Comment)   No pain assessment provided by patient upon arrival        [facility] PT Assessment - 05/18/18 0001      Assessment   Medical Diagnosis  R RTC repair    Onset Date/Surgical Date  10/30/17    Hand Dominance  Right    Next MD Visit  November 2019    Prior Therapy  yes      Precautions   Precautions  None    Precaution Comments  Previous right RTC repair      Restrictions   Weight Bearing Restrictions  No                   [facility] Adult PT Treatment/Exercise - 05/18/18 0001      Exercises   Exercises  Shoulder;Elbow      Elbow Exercises   Elbow Flexion  Strengthening;Right;Supine;Bar weights/barbell    Bar Weights/Barbell (Elbow Flexion)  1 lb     Elbow Flexion Limitations  3x10 reps      Shoulder Exercises: Supine   Protraction  AROM;Right;Other (comment)   3x10 reps   Flexion  AROM;Right    Diagonals  AROM;Right;15 reps    Other Supine Exercises  R shoulder AROM ABCs x1 rep    Other Supine Exercises  R shoulder AROM circles x30 reps      Shoulder Exercises: Sidelying   External Rotation  AROM;Right;Other (comment)   3x10 reps     Shoulder Exercises: Pulleys   Flexion  5 minutes      Shoulder Exercises: ROM/Strengthening   UBE (Upper Arm Bike)  90 RPM x6 min      Manual Therapy   Manual Therapy  Passive ROM    Passive ROM  PROM of R shoulder into flexion, ER, IR to improve ROM                  PT Long Term Goals - 05/05/18 1705      PT LONG TERM GOAL #1   Title  Patient will be independent  with HEP    Time  6    Period  Weeks    Status  New      PT LONG TERM GOAL #2   Title  Patient will improve right shoulder flexion AROM to 110 degrees or greater to perform functional activities.    Time  6    Period  Weeks    Status  New      PT LONG TERM GOAL #3   Title  Patient will improve right shoulder ER AROM to 50+ degrees in order to don/doff apparel.    Time  6    Period  Weeks      PT LONG TERM GOAL #4   Title  Patient will improve right shoulder MMT to 3+/5 or greater in all planes to improve stability during functional activities.    Time  6    Period  Weeks    Status  New      PT LONG TERM GOAL #5   Title  Patient will demonstrate functional IR to C7 to improve ability to wash and comb hair.    Time  6    Period  Weeks    Status  New            Plan - 05/18/18 1125    Clinical Impression Statement  Patient tolerated today's treatment well although still limited by muscle weakness especially with antigravity. Patient would require assist from LUE to slide up wall with RUE. Patient guided through AROM and AAROM exercises to assist with functional strength and ROM. No complaints of any  pain reported by patient. Patient required frequent VCs to refocus on exercises. Patient educated to avoid outdoor activities if fearful of falling and instead do HEP to further progress and improve. Firm end feels and smooth arc of motion noted with PROM of R shoulder.    Rehab Potential  Fair    PT Frequency  2x / week    PT Duration  6 weeks    PT Treatment/Interventions  Electrical Stimulation;Passive range of motion;Manual techniques;ADLs/Self Care Home Management;Iontophoresis 4mg /ml Dexamethasone;Therapeutic activities;Therapeutic exercise;Patient/family education;Moist Heat;Ultrasound;Vasopneumatic Device    PT Next Visit Plan  review HEP, AAROM, PROM, Isometrics and gentle strengthening, modalities PRN. for pain relief.    PT Home Exercise Plan  see patient education section    Consulted and Agree with Plan of Care  Patient       Patient will benefit from skilled therapeutic intervention in order to improve the following deficits and impairments:  Pain, Decreased activity tolerance, Decreased endurance, Decreased range of motion, Decreased strength, Impaired UE functional use  Visit Diagnosis: Stiffness of right shoulder, not elsewhere classified  Muscle weakness (generalized)  Chronic right shoulder pain     Problem List Patient Active Problem List   Diagnosis Date Noted  . Facet degeneration of lumbar region 03/28/2018  . CHRONIC RHINITIS 10/08/2009  . HYPERLIPIDEMIA 05/31/2009  . HYPERTENSION 05/31/2009  . COUGH 05/31/2009  . RETINAL DETACHMENT, BILATERAL, HX OF 05/31/2009    Standley Brooking, PTA 05/18/2018, 11:51 AM  [facility] [address] [place], [place], [address] Phone: [phone]   Fax:  [phone]  Name: Casey Cooley MRN: [id number] Date of Birth: 07/20/1933

## 2018-05-20 ENCOUNTER — Ambulatory Visit: Payer: Medicare Other | Admitting: Physical Therapy

## 2018-05-20 ENCOUNTER — Encounter: Payer: Self-pay | Admitting: Physical Therapy

## 2018-05-20 DIAGNOSIS — M6281 Muscle weakness (generalized): Secondary | ICD-10-CM

## 2018-05-20 DIAGNOSIS — M25611 Stiffness of right shoulder, not elsewhere classified: Secondary | ICD-10-CM | POA: Diagnosis not present

## 2018-05-20 DIAGNOSIS — G8929 Other chronic pain: Secondary | ICD-10-CM

## 2018-05-20 DIAGNOSIS — M25511 Pain in right shoulder: Secondary | ICD-10-CM

## 2018-05-20 NOTE — Therapy (Signed)
[facility] [place], [place], [address] Phone: [phone]   Fax:  [phone]  Physical Therapy Treatment  Patient Details  Name: Casey Cooley MRN: [id number] Date of Birth: 01/23/1933 Referring Provider (PT): Remo Lipps Case, MD   Encounter Date: 05/20/2018  PT End of Session - 05/20/18 1027    Visit Number  5    Number of Visits  12    Date for PT Re-Evaluation  06/16/18    Authorization Type  Progress note every 10th visit    PT Start Time  1020    PT Stop Time  1106    PT Time Calculation (min)  46 min    Activity Tolerance  Patient tolerated treatment well    Behavior During Therapy  [facility] for tasks assessed/performed       History reviewed. No pertinent past medical history.  History reviewed. No pertinent surgical history.  There were no vitals filed for this visit.  Subjective Assessment - 05/20/18 1026    Subjective  No complaints upon arrival.    Pertinent History  DM    Limitations  Lifting;House hold activities    Diagnostic tests  X-Ray, MRI    Patient Stated Goals  build up strength and do yard work on my own    Currently in Pain?  No/denies         [facility] PT Assessment - 05/20/18 0001      Assessment   Medical Diagnosis  R RTC repair    Onset Date/Surgical Date  10/30/17    Hand Dominance  Right    Next MD Visit  November 2019    Prior Therapy  yes      Precautions   Precautions  None    Precaution Comments  Previous right RTC repair      Restrictions   Weight Bearing Restrictions  No                   [facility] Adult PT Treatment/Exercise - 05/20/18 0001      Exercises   Exercises  Shoulder;Elbow      Shoulder Exercises: Supine   Protraction  AROM;Right;Other (comment)   3x10 reps   Flexion  AROM;Right   3x10 reps     Shoulder Exercises: Sidelying   External Rotation  AROM;Right;Other (comment)   3x10 reps     Shoulder Exercises: Standing   Other Standing  Exercises  R shoulder cone lift, coffee cup lift to cabinet x15 reps each      Shoulder Exercises: Pulleys   Flexion  5 minutes      Shoulder Exercises: ROM/Strengthening   UBE (Upper Arm Bike)  90 RPM x8 min      Manual Therapy   Manual Therapy  Passive ROM    Passive ROM  PROM of R shoulder into flex, ER with holds at end range; passive R bicep stretch and abduction stretch 3x30 sec                  PT Long Term Goals - 05/05/18 1705      PT LONG TERM GOAL #1   Title  Patient will be independent with HEP    Time  6    Period  Weeks    Status  New      PT LONG TERM GOAL #2   Title  Patient will improve right shoulder flexion AROM to 110 degrees or greater to perform functional activities.    Time  6    Period  Weeks    Status  New      PT LONG TERM GOAL #3   Title  Patient will improve right shoulder ER AROM to 50+ degrees in order to don/doff apparel.    Time  6    Period  Weeks      PT LONG TERM GOAL #4   Title  Patient will improve right shoulder MMT to 3+/5 or greater in all planes to improve stability during functional activities.    Time  6    Period  Weeks    Status  New      PT LONG TERM GOAL #5   Title  Patient will demonstrate functional IR to C7 to improve ability to wash and comb hair.    Time  6    Period  Weeks    Status  New            Plan - 05/20/18 1114    Clinical Impression Statement  Patient presented in clinic with no pain although functional limitations noted with lifting items to lower cabinet. Patient guided through all ROM exercises with only intermittant pain in R proximal bicep region. Patient required constant VCs or tactile cueing to correct exercise technique. Passive stretching of R shoulder completed as tightness of R anterior shoulder noted. Patient still greatly limited with R shoulder ER. No complaints of pain throughout treatment.    Rehab Potential  Fair    PT Frequency  2x / week    PT Duration  6 weeks    PT  Treatment/Interventions  Electrical Stimulation;Passive range of motion;Manual techniques;ADLs/Self Care Home Management;Iontophoresis 4mg /ml Dexamethasone;Therapeutic activities;Therapeutic exercise;Patient/family education;Moist Heat;Ultrasound;Vasopneumatic Device    PT Next Visit Plan  review HEP, AAROM, PROM, Isometrics and gentle strengthening, modalities PRN. for pain relief.    PT Home Exercise Plan  see patient education section    Consulted and Agree with Plan of Care  Patient       Patient will benefit from skilled therapeutic intervention in order to improve the following deficits and impairments:  Pain, Decreased activity tolerance, Decreased endurance, Decreased range of motion, Decreased strength, Impaired UE functional use  Visit Diagnosis: Stiffness of right shoulder, not elsewhere classified  Muscle weakness (generalized)  Chronic right shoulder pain     Problem List Patient Active Problem List   Diagnosis Date Noted  . Facet degeneration of lumbar region 03/28/2018  . CHRONIC RHINITIS 10/08/2009  . HYPERLIPIDEMIA 05/31/2009  . HYPERTENSION 05/31/2009  . COUGH 05/31/2009  . RETINAL DETACHMENT, BILATERAL, HX OF 05/31/2009    Standley Brooking, PTA 05/20/2018, 12:14 PM  [facility] [address] [place], [place], [address] Phone: [phone]   Fax:  [phone]  Name: Casey Cooley MRN: [id number] Date of Birth: [birth year]

## 2018-05-25 ENCOUNTER — Encounter: Payer: Self-pay | Admitting: Physical Therapy

## 2018-05-25 ENCOUNTER — Ambulatory Visit: Payer: Medicare Other | Admitting: Physical Therapy

## 2018-05-25 DIAGNOSIS — M25611 Stiffness of right shoulder, not elsewhere classified: Secondary | ICD-10-CM | POA: Diagnosis not present

## 2018-05-25 DIAGNOSIS — M25511 Pain in right shoulder: Secondary | ICD-10-CM

## 2018-05-25 DIAGNOSIS — G8929 Other chronic pain: Secondary | ICD-10-CM

## 2018-05-25 DIAGNOSIS — M6281 Muscle weakness (generalized): Secondary | ICD-10-CM

## 2018-05-25 NOTE — Therapy (Signed)
[facility] [place], [place], [address] Phone: [phone]   Fax:  [phone]  Physical Therapy Treatment  Patient Details  Name: Casey Cooley MRN: [id number] Date of Birth: August 12, 1932 Referring Provider (PT): Remo Lipps Case, MD   Encounter Date: 05/25/2018  PT End of Session - 05/25/18 1044    Visit Number  6    Number of Visits  12    Date for PT Re-Evaluation  06/16/18    Authorization Type  Progress note every 10th visit    PT Start Time  1032    PT Stop Time  1116    PT Time Calculation (min)  44 min    Activity Tolerance  Patient tolerated treatment well    Behavior During Therapy  [facility] for tasks assessed/performed       History reviewed. No pertinent past medical history.  History reviewed. No pertinent surgical history.  There were no vitals filed for this visit.  Subjective Assessment - 05/25/18 1044    Subjective  No complaints upon arrival.    Pertinent History  DM    Limitations  Lifting;House hold activities    Diagnostic tests  X-Ray, MRI    Patient Stated Goals  build up strength and do yard work on my own    Currently in Pain?  No/denies         [facility] PT Assessment - 05/25/18 0001      Assessment   Medical Diagnosis  R RTC repair    Onset Date/Surgical Date  10/30/17    Hand Dominance  Right    Next MD Visit  November 2019    Prior Therapy  yes      Precautions   Precautions  None    Precaution Comments  Previous right RTC repair      Restrictions   Weight Bearing Restrictions  No                   [facility] Adult PT Treatment/Exercise - 05/25/18 0001      Exercises   Exercises  Shoulder;Elbow      Shoulder Exercises: Supine   Protraction  Strengthening;Right;Weights;Limitations    Protraction Weight (lbs)  2    Protraction Limitations  3x10 reps    Flexion  AROM;Right;Limitations    Flexion Limitations  x30 reps    Diagonals  AROM;Right;15 reps      Shoulder  Exercises: Sidelying   External Rotation  AROM;Right;Other (comment)   x30 reps     Shoulder Exercises: Standing   Other Standing Exercises  R shoulder cone lift, coffee cup lift to cabinet x30 reps each    Other Standing Exercises  Wall slides x20 reps      Shoulder Exercises: Pulleys   Flexion  Other (comment)   x7 min     Shoulder Exercises: ROM/Strengthening   UBE (Upper Arm Bike)  90 RPM x8 min      Manual Therapy   Manual Therapy  Passive ROM    Passive ROM  PROM of R shoulder into flex, ER with holds at end range; passive R bicep stretch and abduction stretch 3x30 sec                  PT Long Term Goals - 05/05/18 1705      PT LONG TERM GOAL #1   Title  Patient will be independent with HEP    Time  6    Period  Weeks  Status  New      PT LONG TERM GOAL #2   Title  Patient will improve right shoulder flexion AROM to 110 degrees or greater to perform functional activities.    Time  6    Period  Weeks    Status  New      PT LONG TERM GOAL #3   Title  Patient will improve right shoulder ER AROM to 50+ degrees in order to don/doff apparel.    Time  6    Period  Weeks      PT LONG TERM GOAL #4   Title  Patient will improve right shoulder MMT to 3+/5 or greater in all planes to improve stability during functional activities.    Time  6    Period  Weeks    Status  New      PT LONG TERM GOAL #5   Title  Patient will demonstrate functional IR to C7 to improve ability to wash and comb hair.    Time  6    Period  Weeks    Status  New            Plan - 05/25/18 1157    Clinical Impression Statement  Patient tolerated today's treatment well as he was progressed with reps and resistance intermittantly. Patient able to complete cone and coffee cup lifts to lower cabinet better without reports of fatigue. Patient still limited with wall slides in antigravity. Discomfort reported by patient initially with AROM R shoulder D2. Firm end feels and smooth arc  of motion noted with PROM of R shoulder.    Rehab Potential  Fair    PT Frequency  2x / week    PT Duration  6 weeks    PT Treatment/Interventions  Electrical Stimulation;Passive range of motion;Manual techniques;ADLs/Self Care Home Management;Iontophoresis 4mg /ml Dexamethasone;Therapeutic activities;Therapeutic exercise;Patient/family education;Moist Heat;Ultrasound;Vasopneumatic Device    PT Next Visit Plan  review HEP, AAROM, PROM, Isometrics and gentle strengthening, modalities PRN. for pain relief.    PT Home Exercise Plan  see patient education section    Consulted and Agree with Plan of Care  Patient       Patient will benefit from skilled therapeutic intervention in order to improve the following deficits and impairments:  Pain, Decreased activity tolerance, Decreased endurance, Decreased range of motion, Decreased strength, Impaired UE functional use  Visit Diagnosis: Stiffness of right shoulder, not elsewhere classified  Muscle weakness (generalized)  Chronic right shoulder pain     Problem List Patient Active Problem List   Diagnosis Date Noted  . Facet degeneration of lumbar region 03/28/2018  . CHRONIC RHINITIS 10/08/2009  . HYPERLIPIDEMIA 05/31/2009  . HYPERTENSION 05/31/2009  . COUGH 05/31/2009  . RETINAL DETACHMENT, BILATERAL, HX OF 05/31/2009    Standley Brooking, PTA 05/25/2018, 12:00 PM  [facility] [place], [place], [address] Phone: [phone]   Fax:  [phone]  Name: EILAN MCINERNY MRN: [id number] Date of Birth: Apr 16, 1933

## 2018-05-27 ENCOUNTER — Encounter: Payer: Self-pay | Admitting: Physical Therapy

## 2018-05-27 ENCOUNTER — Ambulatory Visit: Payer: Medicare Other | Admitting: Physical Therapy

## 2018-05-27 DIAGNOSIS — M25511 Pain in right shoulder: Secondary | ICD-10-CM

## 2018-05-27 DIAGNOSIS — M25611 Stiffness of right shoulder, not elsewhere classified: Secondary | ICD-10-CM | POA: Diagnosis not present

## 2018-05-27 DIAGNOSIS — M6281 Muscle weakness (generalized): Secondary | ICD-10-CM

## 2018-05-27 DIAGNOSIS — G8929 Other chronic pain: Secondary | ICD-10-CM

## 2018-05-27 NOTE — Therapy (Signed)
[facility] [place], [place], [address] Phone: [phone]   Fax:  [phone]  Physical Therapy Treatment  Patient Details  Name: Casey Cooley MRN: [id number] Date of Birth: [birth year] Referring Provider (PT): Remo Lipps Case, MD   Encounter Date: 05/27/2018  PT End of Session - 05/27/18 1100    Visit Number  7    Number of Visits  12    Date for PT Re-Evaluation  06/16/18    Authorization Type  Progress note every 10th visit    PT Start Time  1028    PT Stop Time  1114    PT Time Calculation (min)  46 min    Activity Tolerance  Patient tolerated treatment well    Behavior During Therapy  [facility] for tasks assessed/performed       History reviewed. No pertinent past medical history.  History reviewed. No pertinent surgical history.  There were no vitals filed for this visit.  Subjective Assessment - 05/27/18 1030    Subjective  Patient arrived with no complaints and reported doing well with therapy thus far    Pertinent History  DM    Limitations  Lifting;House hold activities    Diagnostic tests  X-Ray, MRI    Patient Stated Goals  build up strength and do yard work on my own    Currently in Pain?  No/denies         [facility] PT Assessment - 05/27/18 0001      ROM / Strength   AROM / PROM / Strength  AROM;PROM      AROM   AROM Assessment Site  Shoulder    Right/Left Shoulder  Right    Right Shoulder Flexion  95 Degrees    Right Shoulder External Rotation  30 Degrees      PROM   PROM Assessment Site  Shoulder    Right/Left Shoulder  Right    Right Shoulder Flexion  136 Degrees    Right Shoulder External Rotation  41 Degrees                   [facility] Adult PT Treatment/Exercise - 05/27/18 0001      Exercises   Exercises  Shoulder;Elbow      Shoulder Exercises: Supine   Protraction  Strengthening;Right;Weights;Limitations    Protraction Weight (lbs)  2    Protraction Limitations  3x10 reps     Flexion  AROM;Right;20 reps;10 reps    Other Supine Exercises  R shoulder circles x64min      Shoulder Exercises: Seated   Elevation  Strengthening;AAROM;Right;Limitations    Elevation Limitations  sitting in chair using wall for wall slides    Row  Strengthening;Right;20 reps;Theraband    Theraband Level (Shoulder Row)  Level 1 (Yellow)    Protraction  Strengthening;Right;20 reps;Theraband    Theraband Level (Shoulder Protraction)  Level 1 (Yellow)    External Rotation  Limitations    External Rotation Limitations  unable    Internal Rotation  Strengthening;Right;20 reps;Theraband    Theraband Level (Shoulder Internal Rotation)  Level 1 (Yellow)    Other Seated Exercises  seated cane 3x10 flexion to 90      Shoulder Exercises: Pulleys   Flexion  5 minutes      Shoulder Exercises: ROM/Strengthening   UBE (Upper Arm Bike)  90 RPM x8 min      Manual Therapy   Manual Therapy  Passive ROM    Manual therapy comments  manual PROM/stretching  flexion/ER/IR with holds end range                  PT Long Term Goals - 05/27/18 1032      PT LONG TERM GOAL #1   Title  Patient will be independent with HEP    Time  6    Period  Weeks    Status  On-going      PT LONG TERM GOAL #2   Title  Patient will improve right shoulder flexion AROM to 110 degrees or greater to perform functional activities.    Time  6    Period  Weeks    Status  On-going   AROM 95 degrees 05/27/18     PT LONG TERM GOAL #3   Title  Patient will improve right shoulder ER AROM to 50+ degrees in order to don/doff apparel.    Time  6    Period  Weeks    Status  On-going   AROM 30 degrees 05/27/18     PT LONG TERM GOAL #4   Title  Patient will improve right shoulder MMT to 3+/5 or greater in all planes to improve stability during functional activities.    Time  6    Period  Weeks    Status  On-going      PT LONG TERM GOAL #5   Title  Patient will demonstrate functional IR to C7 to improve ability  to wash and comb hair.    Time  6    Period  Weeks    Status  On-going            Plan - 05/27/18 1101    Clinical Impression Statement  Patient tolerated treatment well today. Patient has improved ROM in right shoulder yet continues to have limitations with range. Patient unable to perform ADL's due to limitations with right shoulder. Patient reported little discomfort overal just sore with overhead movements.    Rehab Potential  Fair    PT Frequency  2x / week    PT Duration  6 weeks    PT Treatment/Interventions  Electrical Stimulation;Passive range of motion;Manual techniques;ADLs/Self Care Home Management;Iontophoresis 4mg /ml Dexamethasone;Therapeutic activities;Therapeutic exercise;Patient/family education;Moist Heat;Ultrasound;Vasopneumatic Device    PT Next Visit Plan  cont with POC for ROM and gentle strengthening    Consulted and Agree with Plan of Care  Patient       Patient will benefit from skilled therapeutic intervention in order to improve the following deficits and impairments:  Pain, Decreased activity tolerance, Decreased endurance, Decreased range of motion, Decreased strength, Impaired UE functional use  Visit Diagnosis: Stiffness of right shoulder, not elsewhere classified  Muscle weakness (generalized)  Chronic right shoulder pain     Problem List Patient Active Problem List   Diagnosis Date Noted  . Facet degeneration of lumbar region 03/28/2018  . CHRONIC RHINITIS 10/08/2009  . HYPERLIPIDEMIA 05/31/2009  . HYPERTENSION 05/31/2009  . COUGH 05/31/2009  . RETINAL DETACHMENT, BILATERAL, HX OF 05/31/2009    Chalsea Darko P, PTA 05/27/2018, 11:14 AM  [facility] [place], [place], [address] Phone: [phone]   Fax:  [phone]  Name: KAILO KOSIK MRN: [id number] Date of Birth: Jan 25, 1933

## 2018-06-01 ENCOUNTER — Ambulatory Visit: Payer: Medicare Other | Admitting: Physical Therapy

## 2018-06-01 ENCOUNTER — Encounter: Payer: Self-pay | Admitting: Physical Therapy

## 2018-06-01 DIAGNOSIS — G8929 Other chronic pain: Secondary | ICD-10-CM

## 2018-06-01 DIAGNOSIS — M25511 Pain in right shoulder: Secondary | ICD-10-CM

## 2018-06-01 DIAGNOSIS — M25611 Stiffness of right shoulder, not elsewhere classified: Secondary | ICD-10-CM | POA: Diagnosis not present

## 2018-06-01 DIAGNOSIS — M6281 Muscle weakness (generalized): Secondary | ICD-10-CM

## 2018-06-01 NOTE — Therapy (Signed)
[facility] [place], [place], [address] Phone: [phone]   Fax:  [phone]  Physical Therapy Treatment  Patient Details  Name: Casey Cooley MRN: [id number] Date of Birth: 1933/06/21 Referring Provider (PT): Remo Lipps Case, MD   Encounter Date: 06/01/2018  PT End of Session - 06/01/18 1043    Visit Number  8    Number of Visits  12    Date for PT Re-Evaluation  06/16/18    Authorization Type  Progress note every 10th visit    PT Start Time  1032    PT Stop Time  1112    PT Time Calculation (min)  40 min    Activity Tolerance  Patient tolerated treatment well    Behavior During Therapy  [facility] for tasks assessed/performed       History reviewed. No pertinent past medical history.  History reviewed. No pertinent surgical history.  There were no vitals filed for this visit.  Subjective Assessment - 06/01/18 1027    Subjective  No complaints upon arrival.    Pertinent History  DM    Limitations  Lifting;House hold activities    Diagnostic tests  X-Ray, MRI    Patient Stated Goals  build up strength and do yard work on my own    Currently in Pain?  No/denies         [facility] PT Assessment - 06/01/18 0001      Assessment   Medical Diagnosis  R RTC repair    Onset Date/Surgical Date  10/30/17    Hand Dominance  Right    Next MD Visit  November 2019    Prior Therapy  yes      Precautions   Precautions  None    Precaution Comments  Previous right RTC repair      Restrictions   Weight Bearing Restrictions  No                   [facility] Adult PT Treatment/Exercise - 06/01/18 0001      Exercises   Exercises  Shoulder;Elbow      Shoulder Exercises: Seated   Row  Strengthening;Right;20 reps;Theraband    Theraband Level (Shoulder Row)  Level 1 (Yellow)    Protraction  Strengthening;Right;20 reps;Theraband    Theraband Level (Shoulder Protraction)  Level 1 (Yellow)    Protraction Limitations  AROM  protraction x20 reps in recline for lawnchair progression    External Rotation  Strengthening;Right;20 reps;Theraband    Theraband Level (Shoulder External Rotation)  Level 1 (Yellow)    Internal Rotation  Strengthening;Right;20 reps;Theraband    Theraband Level (Shoulder Internal Rotation)  Level 1 (Yellow)    Flexion  AROM;Right;20 reps   reclined for lawnchair progression     Shoulder Exercises: Standing   Other Standing Exercises  R shoulder cone lift, coffee cup lift to cabinet x30 reps each    Other Standing Exercises  Slides into flexion on wedge for incline x20 reps      Shoulder Exercises: Pulleys   Flexion  5 minutes    Other Pulley Exercises  Standing UE ranger into flexion 3x10 reps      Shoulder Exercises: ROM/Strengthening   UBE (Upper Arm Bike)  120 RPM x6 min      Shoulder Exercises: Stretch   Wall Stretch - Flexion  Other (comment)   x10 reps with 3 sec hold with patient holding cabinet knob     Manual Therapy   Manual Therapy  Passive ROM    Passive ROM  PROM of R shoulder into flex, ER with holds at end range                  PT Long Term Goals - 05/27/18 1032      PT LONG TERM GOAL #1   Title  Patient will be independent with HEP    Time  6    Period  Weeks    Status  On-going      PT LONG TERM GOAL #2   Title  Patient will improve right shoulder flexion AROM to 110 degrees or greater to perform functional activities.    Time  6    Period  Weeks    Status  On-going   AROM 95 degrees 05/27/18     PT LONG TERM GOAL #3   Title  Patient will improve right shoulder ER AROM to 50+ degrees in order to don/doff apparel.    Time  6    Period  Weeks    Status  On-going   AROM 30 degrees 05/27/18     PT LONG TERM GOAL #4   Title  Patient will improve right shoulder MMT to 3+/5 or greater in all planes to improve stability during functional activities.    Time  6    Period  Weeks    Status  On-going      PT LONG TERM GOAL #5   Title   Patient will demonstrate functional IR to C7 to improve ability to wash and comb hair.    Time  6    Period  Weeks    Status  On-going            Plan - 06/01/18 1149    Clinical Impression Statement  Patient tolerated today's treatment fairly well today with no complaints upon arrival. Patient guided through multiple exercises in recline and not full antigravity to build strength. Patient reported greater fatigue with new reclined exercises. Patient requires max VCs and demo as well as tactile cues to improve form and technique. Firm end feels and smooth arc of motion noted with PROM of R shoulder. Patient reported R shoulder feeling "better" upon end of treatment.    Rehab Potential  Fair    PT Frequency  2x / week    PT Duration  6 weeks    PT Treatment/Interventions  Electrical Stimulation;Passive range of motion;Manual techniques;ADLs/Self Care Home Management;Iontophoresis 4mg /ml Dexamethasone;Therapeutic activities;Therapeutic exercise;Patient/family education;Moist Heat;Ultrasound;Vasopneumatic Device    PT Next Visit Plan  cont with POC for ROM and gentle strengthening    PT Home Exercise Plan  see patient education section    Consulted and Agree with Plan of Care  Patient       Patient will benefit from skilled therapeutic intervention in order to improve the following deficits and impairments:  Pain, Decreased activity tolerance, Decreased endurance, Decreased range of motion, Decreased strength, Impaired UE functional use  Visit Diagnosis: Stiffness of right shoulder, not elsewhere classified  Muscle weakness (generalized)  Chronic right shoulder pain     Problem List Patient Active Problem List   Diagnosis Date Noted  . Facet degeneration of lumbar region 03/28/2018  . CHRONIC RHINITIS 10/08/2009  . HYPERLIPIDEMIA 05/31/2009  . HYPERTENSION 05/31/2009  . COUGH 05/31/2009  . RETINAL DETACHMENT, BILATERAL, HX OF 05/31/2009    Standley Brooking, PTA 06/01/2018,  11:55 AM  [facility] [address] [place], [place], [address] Phone: [phone]  Fax:  [phone]  Name: Casey Cooley MRN: [id number] Date of Birth: [birth year]

## 2018-06-03 ENCOUNTER — Ambulatory Visit: Payer: Medicare Other | Admitting: Physical Therapy

## 2018-06-03 ENCOUNTER — Encounter: Payer: Self-pay | Admitting: Physical Therapy

## 2018-06-03 DIAGNOSIS — M25611 Stiffness of right shoulder, not elsewhere classified: Secondary | ICD-10-CM

## 2018-06-03 DIAGNOSIS — G8929 Other chronic pain: Secondary | ICD-10-CM

## 2018-06-03 DIAGNOSIS — M25511 Pain in right shoulder: Secondary | ICD-10-CM

## 2018-06-03 DIAGNOSIS — M6281 Muscle weakness (generalized): Secondary | ICD-10-CM

## 2018-06-03 NOTE — Therapy (Signed)
[facility] [place], [place], [address] Phone: [phone]   Fax:  [phone]  Physical Therapy Treatment  Patient Details  Name: Casey Cooley MRN: [id number] Date of Birth: [birth year] Referring Provider (PT): Remo Lipps Case, MD   Encounter Date: 06/03/2018  PT End of Session - 06/03/18 1102    Visit Number  9    Number of Visits  12    Date for PT Re-Evaluation  06/16/18    Authorization Type  Progress note every 10th visit    PT Start Time  1030    PT Stop Time  1110    PT Time Calculation (min)  40 min    Activity Tolerance  Patient tolerated treatment well    Behavior During Therapy  [facility] for tasks assessed/performed       History reviewed. No pertinent past medical history.  History reviewed. No pertinent surgical history.  There were no vitals filed for this visit.  Subjective Assessment - 06/03/18 1036    Subjective  Patient arrived with no new complaints, "doing ok"    Pertinent History  DM    Limitations  Lifting;House hold activities    Diagnostic tests  X-Ray, MRI    Patient Stated Goals  build up strength and do yard work on my own    Currently in Pain?  No/denies         [facility] PT Assessment - 06/03/18 0001      AROM   AROM Assessment Site  Shoulder    Right/Left Shoulder  Right    Right Shoulder Flexion  96 Degrees    Right Shoulder External Rotation  30 Degrees      PROM   PROM Assessment Site  Shoulder    Right/Left Shoulder  Right    Right Shoulder Flexion  136 Degrees    Right Shoulder External Rotation  42 Degrees                   [facility] Adult PT Treatment/Exercise - 06/03/18 0001      Shoulder Exercises: Supine   Other Supine Exercises  cane flexion stretch 2x10      Shoulder Exercises: Seated   Row  Strengthening;Right;20 reps;Theraband;10 reps    Theraband Level (Shoulder Row)  Level 1 (Yellow)    Protraction  Strengthening;Right;20 reps;Theraband;10 reps    Theraband Level (Shoulder Protraction)  Level 1 (Yellow)    External Rotation  Strengthening;Right;20 reps;Theraband;10 reps    Theraband Level (Shoulder External Rotation)  Level 1 (Yellow)    Internal Rotation  Strengthening;Right;20 reps;Theraband;10 reps    Theraband Level (Shoulder Internal Rotation)  Level 1 (Yellow)    Other Seated Exercises  seated cane 3x10 flexion to 90      Shoulder Exercises: Pulleys   Flexion  5 minutes      Shoulder Exercises: ROM/Strengthening   UBE (Upper Arm Bike)  120 RPM x6 min      Manual Therapy   Manual Therapy  Passive ROM    Manual therapy comments  manual PROM/stretching flexion/ER/IR with holds end range   heat to help relax with muscle guarding throughout                 PT Long Term Goals - 05/27/18 1032      PT LONG TERM GOAL #1   Title  Patient will be independent with HEP    Time  6    Period  Weeks    Status  On-going      PT LONG TERM GOAL #2   Title  Patient will improve right shoulder flexion AROM to 110 degrees or greater to perform functional activities.    Time  6    Period  Weeks    Status  On-going   AROM 95 degrees 05/27/18     PT LONG TERM GOAL #3   Title  Patient will improve right shoulder ER AROM to 50+ degrees in order to don/doff apparel.    Time  6    Period  Weeks    Status  On-going   AROM 30 degrees 05/27/18     PT LONG TERM GOAL #4   Title  Patient will improve right shoulder MMT to 3+/5 or greater in all planes to improve stability during functional activities.    Time  6    Period  Weeks    Status  On-going      PT LONG TERM GOAL #5   Title  Patient will demonstrate functional IR to C7 to improve ability to wash and comb hair.    Time  6    Period  Weeks    Status  On-going            Plan - 06/03/18 1103    Clinical Impression Statement  Patient tolerated treatment well today. Patient has ongoing soreness and stiffness in right shoulder yet able to progress with exercises.  Patient has guarding with right shoulder manual PROM yet provided heat to help relax muscles during ROM. Patient continues to do well with exercises yet limited due to strength and ROM. Goals ongoing at this time.     Rehab Potential  Fair    PT Frequency  2x / week    PT Duration  6 weeks    PT Treatment/Interventions  Electrical Stimulation;Passive range of motion;Manual techniques;ADLs/Self Care Home Management;Iontophoresis 4mg /ml Dexamethasone;Therapeutic activities;Therapeutic exercise;Patient/family education;Moist Heat;Ultrasound;Vasopneumatic Device    PT Next Visit Plan  cont with POC for ROM and gentle strengthening    Consulted and Agree with Plan of Care  Patient       Patient will benefit from skilled therapeutic intervention in order to improve the following deficits and impairments:  Pain, Decreased activity tolerance, Decreased endurance, Decreased range of motion, Decreased strength, Impaired UE functional use  Visit Diagnosis: Stiffness of right shoulder, not elsewhere classified  Muscle weakness (generalized)  Chronic right shoulder pain     Problem List Patient Active Problem List   Diagnosis Date Noted  . Facet degeneration of lumbar region 03/28/2018  . CHRONIC RHINITIS 10/08/2009  . HYPERLIPIDEMIA 05/31/2009  . HYPERTENSION 05/31/2009  . COUGH 05/31/2009  . RETINAL DETACHMENT, BILATERAL, HX OF 05/31/2009    DUNFORD, CHRISTINA P, PTA 06/03/2018, 11:11 AM  [facility] [place], [place], [address] Phone: [phone]   Fax:  [phone]  Name: Casey Cooley MRN: [id number] Date of Birth: 08-02-1933

## 2018-06-08 ENCOUNTER — Encounter: Payer: Self-pay | Admitting: Physical Therapy

## 2018-06-08 ENCOUNTER — Ambulatory Visit: Payer: Medicare Other | Admitting: Physical Therapy

## 2018-06-08 DIAGNOSIS — M25511 Pain in right shoulder: Secondary | ICD-10-CM

## 2018-06-08 DIAGNOSIS — M25611 Stiffness of right shoulder, not elsewhere classified: Secondary | ICD-10-CM

## 2018-06-08 DIAGNOSIS — G8929 Other chronic pain: Secondary | ICD-10-CM

## 2018-06-08 DIAGNOSIS — M6281 Muscle weakness (generalized): Secondary | ICD-10-CM

## 2018-06-08 NOTE — Therapy (Signed)
[facility] [place], [place], [address] Phone: [phone]   Fax:  [phone]  Physical Therapy Treatment  Progress Note Reporting Period 05/05/18 to 06/08/18  See note below for Objective Data and Assessment of Progress/Goals.     Patient Details  Name: Casey Cooley MRN: [id number] Date of Birth: 07-09-1933 Referring Provider (PT): Remo Lipps Case, MD   Encounter Date: 06/08/2018  PT End of Session - 06/08/18 1035    Visit Number  10    Number of Visits  12    Date for PT Re-Evaluation  06/16/18    Authorization Type  Progress note every 10th visit    PT Start Time  1031    PT Stop Time  1114    PT Time Calculation (min)  43 min    Activity Tolerance  Patient tolerated treatment well    Behavior During Therapy  [facility] for tasks assessed/performed       History reviewed. No pertinent past medical history.  History reviewed. No pertinent surgical history.  There were no vitals filed for this visit.  Subjective Assessment - 06/08/18 1034    Subjective  Reports that his shoulder hurts "all the time" at home.    Pertinent History  DM    Limitations  Lifting;House hold activities    Diagnostic tests  X-Ray, MRI    Patient Stated Goals  build up strength and do yard work on my own    Currently in Pain?  No/denies         [facility] PT Assessment - 06/08/18 0001      Assessment   Medical Diagnosis  R RTC repair    Referring Provider (PT)  Remo Lipps Case, MD    Onset Date/Surgical Date  10/30/17    Hand Dominance  Right    Next MD Visit  November 2019    Prior Therapy  yes      Precautions   Precautions  None    Precaution Comments  Previous right RTC repair      Restrictions   Weight Bearing Restrictions  No                   [facility] Adult PT Treatment/Exercise - 06/08/18 0001      Exercises   Exercises  Shoulder;Elbow      Elbow Exercises   Elbow Flexion  Strengthening;Right;Other reps  (comment);Supine;Bar weights/barbell   x30 reps   Bar Weights/Barbell (Elbow Flexion)  2 lbs      Shoulder Exercises: Supine   Protraction  Strengthening;Right;Weights   x30 reps   Protraction Weight (lbs)  2      Shoulder Exercises: Seated   Extension  Strengthening;Right;20 reps;Theraband    Theraband Level (Shoulder Extension)  Level 1 (Yellow)    Protraction  Strengthening;Right;20 reps;Theraband;10 reps    Theraband Level (Shoulder Protraction)  Level 1 (Yellow)    External Rotation  Strengthening;Right;20 reps;Theraband;10 reps    Theraband Level (Shoulder External Rotation)  Level 1 (Yellow)    Internal Rotation  Strengthening;Right;20 reps;Theraband;10 reps    Theraband Level (Shoulder Internal Rotation)  Level 1 (Yellow)    Flexion  AROM;Right;20 reps   to shoulder height   Other Seated Exercises  R shoulder AROM scaption x20 reps to shoulder height      Shoulder Exercises: Standing   Other Standing Exercises  R shoulder coffee cup lift to cabinet x30 reps each    Other Standing Exercises  Clothes pin retrieval at shoulder  height x1 RT      Shoulder Exercises: Pulleys   Flexion  5 minutes    Other Pulley Exercises  Standing UE ranger into flexion 3x10 reps      Shoulder Exercises: ROM/Strengthening   UBE (Upper Arm Bike)  60 RPM x6 min      Manual Therapy   Manual Therapy  Passive ROM    Passive ROM  PROM of R shoulder into flex, ER with holds at end range                  PT Long Term Goals - 05/27/18 1032      PT LONG TERM GOAL #1   Title  Patient will be independent with HEP    Time  6    Period  Weeks    Status  On-going      PT LONG TERM GOAL #2   Title  Patient will improve right shoulder flexion AROM to 110 degrees or greater to perform functional activities.    Time  6    Period  Weeks    Status  On-going   AROM 95 degrees 05/27/18     PT LONG TERM GOAL #3   Title  Patient will improve right shoulder ER AROM to 50+ degrees in order to  don/doff apparel.    Time  6    Period  Weeks    Status  On-going   AROM 30 degrees 05/27/18     PT LONG TERM GOAL #4   Title  Patient will improve right shoulder MMT to 3+/5 or greater in all planes to improve stability during functional activities.    Time  6    Period  Weeks    Status  On-going      PT LONG TERM GOAL #5   Title  Patient will demonstrate functional IR to C7 to improve ability to wash and comb hair.    Time  6    Period  Weeks    Status  On-going            Plan - 06/08/18 1132    Clinical Impression Statement  Patient progressed with antigravity exercises and pushed more into AROM exercises to allow return to function. Patient still functionally weak with coffee cup lifts to bottom cabinet and weak in any antigravity exercise. No complaints of any discomfort during exercises but patient required increased VCs and tactile cues to improve form. Firm end feels and smooth arc of motion noted during PROM.    Rehab Potential  Fair    PT Frequency  2x / week    PT Duration  6 weeks    PT Treatment/Interventions  Electrical Stimulation;Passive range of motion;Manual techniques;ADLs/Self Care Home Management;Iontophoresis 4mg /ml Dexamethasone;Therapeutic activities;Therapeutic exercise;Patient/family education;Moist Heat;Ultrasound;Vasopneumatic Device    PT Next Visit Plan  cont with POC for ROM and gentle strengthening    PT Home Exercise Plan  see patient education section    Consulted and Agree with Plan of Care  Patient       Patient will benefit from skilled therapeutic intervention in order to improve the following deficits and impairments:  Pain, Decreased activity tolerance, Decreased endurance, Decreased range of motion, Decreased strength, Impaired UE functional use  Visit Diagnosis: Stiffness of right shoulder, not elsewhere classified  Muscle weakness (generalized)  Chronic right shoulder pain     Problem List Patient Active Problem List    Diagnosis Date Noted  . Facet degeneration of lumbar region 03/28/2018  .  CHRONIC RHINITIS 10/08/2009  . HYPERLIPIDEMIA 05/31/2009  . HYPERTENSION 05/31/2009  . COUGH 05/31/2009  . RETINAL DETACHMENT, BILATERAL, HX OF 05/31/2009    Standley Brooking, PTA 06/08/2018, 11:36 AM  [facility] [address] [place], [place], [address] Phone: [phone]   Fax:  [phone]  Name: Casey Cooley MRN: [id number] Date of Birth: 08-Sep-1932

## 2018-06-10 ENCOUNTER — Ambulatory Visit: Payer: Medicare Other | Admitting: Physical Therapy

## 2018-06-10 ENCOUNTER — Encounter: Payer: Self-pay | Admitting: Physical Therapy

## 2018-06-10 DIAGNOSIS — G8929 Other chronic pain: Secondary | ICD-10-CM

## 2018-06-10 DIAGNOSIS — M25511 Pain in right shoulder: Secondary | ICD-10-CM

## 2018-06-10 DIAGNOSIS — M25611 Stiffness of right shoulder, not elsewhere classified: Secondary | ICD-10-CM | POA: Diagnosis not present

## 2018-06-10 DIAGNOSIS — M6281 Muscle weakness (generalized): Secondary | ICD-10-CM

## 2018-06-10 NOTE — Therapy (Signed)
[facility] [place], [place], [address] Phone: [phone]   Fax:  [phone]  Physical Therapy Treatment  Patient Details  Name: Casey Cooley MRN: [id number] Date of Birth: 06/06/1933 Referring Provider (PT): Remo Lipps Case, MD   Encounter Date: 06/10/2018  PT End of Session - 06/10/18 1103    Visit Number  11    Number of Visits  12    Date for PT Re-Evaluation  06/16/18    Authorization Type  Progress note every 10th visit    PT Start Time  1031    PT Stop Time  1112    PT Time Calculation (min)  41 min    Activity Tolerance  Patient tolerated treatment well    Behavior During Therapy  [facility] for tasks assessed/performed       History reviewed. No pertinent past medical history.  History reviewed. No pertinent surgical history.  There were no vitals filed for this visit.  Subjective Assessment - 06/10/18 1040    Subjective  Patient arrived with ongoing soreness (no pain) yet able to use arm more for ADL's    Pertinent History  DM    Limitations  Lifting;House hold activities    Diagnostic tests  X-Ray, MRI    Patient Stated Goals  build up strength and do yard work on my own    Currently in Pain?  No/denies         [facility] PT Assessment - 06/10/18 0001      AROM   AROM Assessment Site  Shoulder    Right/Left Shoulder  Right    Right Shoulder Flexion  98 Degrees    Right Shoulder External Rotation  31 Degrees      PROM   PROM Assessment Site  Shoulder    Right/Left Shoulder  Right    Right Shoulder Flexion  140 Degrees    Right Shoulder External Rotation  45 Degrees                   [facility] Adult PT Treatment/Exercise - 06/10/18 0001      Elbow Exercises   Elbow Flexion  Strengthening;Right;Other reps (comment);Supine;Bar weights/barbell;20 reps;10 reps    Bar Weights/Barbell (Elbow Flexion)  2 lbs      Shoulder Exercises: Seated   Extension  Strengthening;Right;20 reps;Theraband     Theraband Level (Shoulder Extension)  Level 1 (Yellow)    Protraction  Strengthening;Right;20 reps;Theraband;10 reps    Theraband Level (Shoulder Protraction)  Level 1 (Yellow)    External Rotation  Strengthening;Right;20 reps;Theraband;10 reps    Theraband Level (Shoulder External Rotation)  Level 1 (Yellow)    Internal Rotation  Strengthening;Right;20 reps;Theraband;10 reps    Theraband Level (Shoulder Internal Rotation)  Level 1 (Yellow)    Flexion  AROM;Right;20 reps   to 90 degrees   Other Seated Exercises  R shoulder AROM scaption x20 reps to 90 degrees       Shoulder Exercises: Standing   Other Standing Exercises  R shoulder coffee cup lift to cabinet x30 reps each    Other Standing Exercises  Clothes pin retrieval at shoulder height x1 RT      Shoulder Exercises: Pulleys   Flexion  5 minutes    Other Pulley Exercises  Standing UE ranger into flexion 3x10 reps      Shoulder Exercises: ROM/Strengthening   UBE (Upper Arm Bike)  60 RPM x8 min      Manual Therapy   Manual Therapy  Passive  ROM    Passive ROM  PROM of R shoulder into flex, ER with holds at end range                  PT Long Term Goals - 06/10/18 1048      PT LONG TERM GOAL #1   Title  Patient will be independent with HEP    Time  6    Period  Weeks    Status  On-going      PT LONG TERM GOAL #2   Title  Patient will improve right shoulder flexion AROM to 110 degrees or greater to perform functional activities.    Time  6    Period  Weeks    Status  On-going   AROM 98 degrres 06/10/18     PT LONG TERM GOAL #3   Title  Patient will improve right shoulder ER AROM to 50+ degrees in order to don/doff apparel.    Time  6    Period  Weeks    Status  On-going   AROM 31 degrees 06/10/18     PT LONG TERM GOAL #4   Title  Patient will improve right shoulder MMT to 3+/5 or greater in all planes to improve stability during functional activities.    Time  6    Period  Weeks    Status  On-going       PT LONG TERM GOAL #5   Title  Patient will demonstrate functional IR to C7 to improve ability to wash and comb hair.    Baseline  MET 06/10/18    Status  Achieved   above C7 06/10/18           Plan - 06/10/18 1105    Clinical Impression Statement  Patient tolerated treatment well today. Patient progressing with AROM and overall ROM in right shoulder. Patient able to perform ADL's with greater ease and able to brush and wash hair independentlty with right UE. Patient has some ongoing limitations with strength and ROM. Met LTG #5 today others progressing.     Rehab Potential  Fair    PT Frequency  2x / week    PT Duration  6 weeks    PT Treatment/Interventions  Electrical Stimulation;Passive range of motion;Manual techniques;ADLs/Self Care Home Management;Iontophoresis '4mg'$ /ml Dexamethasone;Therapeutic activities;Therapeutic exercise;Patient/family education;Moist Heat;Ultrasound;Vasopneumatic Device    PT Next Visit Plan  cont with POC for ROM and gentle strengthening, last recert not signed by MD, patient is to call MD for new order    Consulted and Agree with Plan of Care  Patient       Patient will benefit from skilled therapeutic intervention in order to improve the following deficits and impairments:  Pain, Decreased activity tolerance, Decreased endurance, Decreased range of motion, Decreased strength, Impaired UE functional use  Visit Diagnosis: Stiffness of right shoulder, not elsewhere classified  Muscle weakness (generalized)  Chronic right shoulder pain     Problem List Patient Active Problem List   Diagnosis Date Noted  . Facet degeneration of lumbar region 03/28/2018  . CHRONIC RHINITIS 10/08/2009  . HYPERLIPIDEMIA 05/31/2009  . HYPERTENSION 05/31/2009  . COUGH 05/31/2009  . RETINAL DETACHMENT, BILATERAL, HX OF 05/31/2009    Jacaden Forbush P, PTA 06/10/2018, 11:12 AM  [facility] [place], [place], [address] Phone: [phone]   Fax:  [phone]  Name: DEANGLO HISSONG MRN: [id number] Date of Birth: 1933-06-13

## 2018-06-15 ENCOUNTER — Ambulatory Visit: Payer: Medicare Other | Attending: Orthopedic Surgery | Admitting: Physical Therapy

## 2018-06-15 ENCOUNTER — Encounter: Payer: Self-pay | Admitting: Physical Therapy

## 2018-06-15 DIAGNOSIS — M6281 Muscle weakness (generalized): Secondary | ICD-10-CM

## 2018-06-15 DIAGNOSIS — M25511 Pain in right shoulder: Secondary | ICD-10-CM | POA: Insufficient documentation

## 2018-06-15 DIAGNOSIS — G8929 Other chronic pain: Secondary | ICD-10-CM | POA: Insufficient documentation

## 2018-06-15 DIAGNOSIS — M25611 Stiffness of right shoulder, not elsewhere classified: Secondary | ICD-10-CM | POA: Diagnosis not present

## 2018-06-15 NOTE — Therapy (Addendum)
[facility] [place], [place], [address] Phone: [phone]   Fax:  [phone]  Physical Therapy Treatment/Discharge    PHYSICAL THERAPY DISCHARGE SUMMARY  Visits from Start of Care: 12   Current functional level related to goals / functional outcomes: See below    Remaining deficits: See goals.   Education / Equipment: HEP Plan: Patient agrees to discharge.  Patient goals were partially met. Patient is being discharged due to lack of progress.  ?????    Gabriela Eves, PT, DPT 09/07/18     Patient Details  Name: Casey Cooley MRN: [id number] Date of Birth: 1932/09/23 Referring Provider (PT): Remo Lipps Case, MD   Encounter Date: 06/15/2018  PT End of Session - 06/15/18 1036    Visit Number  12    Number of Visits  12    Date for PT Re-Evaluation  06/16/18    Authorization Type  Progress note every 10th visit    PT Start Time  1034    PT Stop Time  1116    PT Time Calculation (min)  42 min    Activity Tolerance  Patient tolerated treatment well    Behavior During Therapy  [facility] for tasks assessed/performed       History reviewed. No pertinent past medical history.  History reviewed. No pertinent surgical history.  There were no vitals filed for this visit.  Subjective Assessment - 06/15/18 1036    Subjective  Reports that he can shave a little better now, able to feed himself, wash his hair and brush his teeth better.    Pertinent History  DM    Limitations  Lifting;House hold activities    Diagnostic tests  X-Ray, MRI    Patient Stated Goals  build up strength and do yard work on my own    Currently in Pain?  No/denies         [facility] PT Assessment - 06/15/18 0001      Assessment   Medical Diagnosis  R RTC repair    Referring Provider (PT)  Remo Lipps Case, MD    Onset Date/Surgical Date  10/30/17    Hand Dominance  Right    Next MD Visit  November 2019    Prior Therapy  yes      Precautions    Precautions  None    Precaution Comments  Previous right RTC repair      Restrictions   Weight Bearing Restrictions  No      ROM / Strength   AROM / PROM / Strength  AROM;Strength      AROM   Overall AROM   Deficits    AROM Assessment Site  Shoulder    Right/Left Shoulder  Right    Right Shoulder Flexion  105 Degrees   116 deg in supine   Right Shoulder Internal Rotation  60 Degrees    Right Shoulder External Rotation  42 Degrees      Strength   Overall Strength  Deficits    Strength Assessment Site  Shoulder    Right/Left Shoulder  Right    Right Shoulder Flexion  4/5    Right Shoulder Internal Rotation  4+/5    Right Shoulder External Rotation  4/5                   [facility] Adult PT Treatment/Exercise - 06/15/18 0001      Exercises   Exercises  Shoulder;Elbow      Elbow Exercises  Elbow Flexion  Strengthening;Right;Other reps (comment);Supine;Bar weights/barbell;20 reps;10 reps   x30 reps    Bar Weights/Barbell (Elbow Flexion)  3 lbs    Elbow Extension  Strengthening;Right;Other reps (comment);Seated;Theraband    Theraband Level (Elbow Extension)  Level 2 (Red)    Elbow Extension Limitations  x30 rpes      Shoulder Exercises: Seated   Extension  Strengthening;Right;20 reps;Theraband    Theraband Level (Shoulder Extension)  Level 2 (Red)    Protraction  Strengthening;Right;20 reps;Theraband;10 reps    Theraband Level (Shoulder Protraction)  Level 2 (Red)    External Rotation  Strengthening;Right;20 reps;Theraband;10 reps    Theraband Level (Shoulder External Rotation)  Level 2 (Red)    Internal Rotation  Strengthening;Right;20 reps;Theraband;10 reps    Theraband Level (Shoulder Internal Rotation)  Level 2 (Red)    Flexion  Strengthening;Right;20 reps;Weights    Flexion Weight (lbs)  1      Shoulder Exercises: Standing   Other Standing Exercises  R shoulder coffee cup lift to cabinet x30 reps each    Other Standing Exercises  Wall slides RUE x15  reps      Shoulder Exercises: Pulleys   Flexion  5 minutes    Other Pulley Exercises  Standing UE ranger into flexion 3x10 reps      Shoulder Exercises: ROM/Strengthening   UBE (Upper Arm Bike)  60 RPM x8 min                  PT Long Term Goals - 06/15/18 1206      PT LONG TERM GOAL #1   Title  Patient will be independent with HEP    Time  6    Period  Weeks    Status  Partially Met   Intermittant completion per patient      PT LONG TERM GOAL #2   Title  Patient will improve right shoulder flexion AROM to 110 degrees or greater to perform functional activities.    Time  6    Period  Weeks    Status  Not Met   AROM 105 deg 06/15/2018     PT LONG TERM GOAL #3   Title  Patient will improve right shoulder ER AROM to 50+ degrees in order to don/doff apparel.    Time  6    Period  Weeks    Status  Not Met   AROM 42 degrees 06/15/18     PT LONG TERM GOAL #4   Title  Patient will improve right shoulder MMT to 3+/5 or greater in all planes to improve stability during functional activities.    Time  6    Period  Weeks    Status  Achieved      PT LONG TERM GOAL #5   Title  Patient will demonstrate functional IR to C7 to improve ability to wash and comb hair.    Baseline  MET 06/10/18    Status  Achieved   above C7 06/10/18           Plan - 06/15/18 1207    Clinical Impression Statement  Patient presented in clinic today with no reports of any R shoulder pain. Patient guided through various functional exercises for shoulder flexion to cabinets and wall slides which he required minimal assist from opposite UE. Patient progressed for shoulder strengthening to red theraband without complaints. More multimodal cueing required for patient to correct any incorrect technique. Patient able to achieve MMT goals and some ROM or was very  close to goal status. No complaints following end of treatment.    Rehab Potential  Fair    PT Frequency  2x / week    PT Duration  6  weeks    PT Treatment/Interventions  Electrical Stimulation;Passive range of motion;Manual techniques;ADLs/Self Care Home Management;Iontophoresis '4mg'$ /ml Dexamethasone;Therapeutic activities;Therapeutic exercise;Patient/family education;Moist Heat;Ultrasound;Vasopneumatic Device    PT Next Visit Plan  Place on hold until appointment with MD.    PT Home Exercise Plan  see patient education section    Consulted and Agree with Plan of Care  Patient       Patient will benefit from skilled therapeutic intervention in order to improve the following deficits and impairments:  Pain, Decreased activity tolerance, Decreased endurance, Decreased range of motion, Decreased strength, Impaired UE functional use  Visit Diagnosis: Stiffness of right shoulder, not elsewhere classified  Muscle weakness (generalized)  Chronic right shoulder pain     Problem List Patient Active Problem List   Diagnosis Date Noted  . Facet degeneration of lumbar region 03/28/2018  . CHRONIC RHINITIS 10/08/2009  . HYPERLIPIDEMIA 05/31/2009  . HYPERTENSION 05/31/2009  . COUGH 05/31/2009  . RETINAL DETACHMENT, BILATERAL, HX OF 05/31/2009    Standley Brooking, PTA 06/15/18 12:16 PM   [facility] [address] [place], [place], [address] Phone: [phone]   Fax:  [phone]  Name: Casey Cooley MRN: [id number] Date of Birth: [birth year]

## 2018-06-17 ENCOUNTER — Ambulatory Visit: Payer: Medicare Other | Admitting: Physical Therapy

## 2018-08-19 ENCOUNTER — Encounter (INDEPENDENT_AMBULATORY_CARE_PROVIDER_SITE_OTHER): Payer: Self-pay | Admitting: Orthopaedic Surgery

## 2018-08-19 ENCOUNTER — Ambulatory Visit (INDEPENDENT_AMBULATORY_CARE_PROVIDER_SITE_OTHER): Payer: Medicare Other | Admitting: Orthopaedic Surgery

## 2018-08-19 VITALS — BP 150/73 | HR 72 | Ht 73.0 in | Wt 228.0 lb

## 2018-08-19 DIAGNOSIS — M47816 Spondylosis without myelopathy or radiculopathy, lumbar region: Secondary | ICD-10-CM | POA: Diagnosis not present

## 2018-08-19 NOTE — Progress Notes (Signed)
Office Visit Note   Patient: Casey Cooley           Date of Birth: 03-Nov-1932           MRN: [id number] Visit Date: 08/19/2018              Requested by: Dione Housekeeper, MD [address] [place], [place] [address] PCP: Dione Housekeeper, MD   Assessment & Plan: Visit Diagnoses:  1. Facet degeneration of lumbar region     Plan: Patient gets tired when he walks to the mailbox and back but he gets relief when he sits.  I discussed with him he needs to get up and walk on a daily basis using his walker to prevent falling and after he gets tired he needs to sit rest for a while and then repeat his walking in order to improve his balance improve his leg strength and improve his endurance.  I discussed with him at this point he does not need surgery on his back but he will not get any better sitting in a chair all day long and not walking.  Multiple questions were answered from the patient, his sister and wife.  They will encourage him with daily walking and keep a measured log of his number of steps and distance he is able to ambulate.  We discussed fall prevention avoiding uneven ground, slippery muddy conditions etc.  He has 2 walkers at home and will follow with the outlined plan as stated above.  Follow-Up Instructions: No follow-ups on file.   Orders:  No orders of the defined types were placed in this encounter.  No orders of the defined types were placed in this encounter.     Procedures: No procedures performed   Clinical Data: No additional findings.   Subjective: Chief Complaint  Patient presents with  . Lower Back - Pain    HPI 85 year old male seen with chronic low back pain.  He states he saw Dr. Edrick Oh and he got an injection of Toradol which gave him good relief for a day or so.  He ambulates with a cane he states he has had 8-10 falls over the last few years.  He has a walker at that he does not always use.  He spends most of his time sitting in his chair and  is here with his wife as well as a sister who have encouraged him to ambulate as we previously discussed.  MRI scan in March 11, 2018 showed disc degeneration with moderate foraminal stenosis on the left at L3 and right at L4.  Moderate facet arthrosis at L5-S1 and severe right facet arthrosis at L5-S1.  Patient sits in a lift chair most the day watches TV.  In September we had encouraged him to get up and walk using his walker so he will not fall and try to increase his distance to strengthen his legs.  Today we reviewed his MRI scan once again images and report.  Review of Systems 14 point system update unchanged from 04/22/2018 office visit.   Objective: Vital Signs: BP (!) 150/73   Pulse 72   Ht 6\' 1"  (1.854 m)   Wt 228 lb (103.4 kg)   BMI 30.08 kg/m   Physical Exam Constitutional:      Appearance: He is well-developed.  HENT:     Head: Normocephalic and atraumatic.  Eyes:     Pupils: Pupils are equal, round, and reactive to light.  Neck:     Thyroid:  No thyromegaly.     Trachea: No tracheal deviation.  Cardiovascular:     Rate and Rhythm: Normal rate.  Pulmonary:     Effort: Pulmonary effort is normal.     Breath sounds: No wheezing.  Abdominal:     General: Bowel sounds are normal.     Palpations: Abdomen is soft.  Skin:    General: Skin is warm and dry.     Capillary Refill: Capillary refill takes less than 2 seconds.  Neurological:     Mental Status: He is alert and oriented to person, place, and time.  Psychiatric:        Behavior: Behavior normal.        Thought Content: Thought content normal.        Judgment: Judgment normal.     Ortho Exam patient ambulates today without his cane he has a short stride gait has some weakness of both quads, some anterior tib some gastrocsoleus.  Balance is fair.  Reflexes are intact distal pulses are palpable no venous stasis changes no pain with hip range of motion.  Minimal sciatic notch tenderness.  Negative straight leg  raising 90 degrees. Specialty Comments:  No specialty comments available.  Imaging: No results found.   PMFS History: Patient Active Problem List   Diagnosis Date Noted  . Facet degeneration of lumbar region 03/28/2018  . CHRONIC RHINITIS 10/08/2009  . HYPERLIPIDEMIA 05/31/2009  . HYPERTENSION 05/31/2009  . COUGH 05/31/2009  . RETINAL DETACHMENT, BILATERAL, HX OF 05/31/2009   No past medical history on file.  No family history on file.  No past surgical history on file. Social History   Occupational History  . Not on file  Tobacco Use  . Smoking status: Never Smoker  . Smokeless tobacco: Never Used  Substance and Sexual Activity  . Alcohol use: Not on file  . Drug use: Not on file  . Sexual activity: Not on file

## 2018-09-07 ENCOUNTER — Ambulatory Visit: Payer: Medicare Other | Admitting: Physical Therapy

## 2019-06-15 DIAGNOSIS — H353132 Nonexudative age-related macular degeneration, bilateral, intermediate dry stage: Secondary | ICD-10-CM | POA: Diagnosis not present

## 2019-06-15 DIAGNOSIS — H0100A Unspecified blepharitis right eye, upper and lower eyelids: Secondary | ICD-10-CM | POA: Diagnosis not present

## 2019-06-15 DIAGNOSIS — H401134 Primary open-angle glaucoma, bilateral, indeterminate stage: Secondary | ICD-10-CM | POA: Diagnosis not present

## 2019-06-15 DIAGNOSIS — H524 Presbyopia: Secondary | ICD-10-CM | POA: Diagnosis not present

## 2019-08-11 DIAGNOSIS — R0902 Hypoxemia: Secondary | ICD-10-CM | POA: Diagnosis not present

## 2019-08-11 DIAGNOSIS — R5381 Other malaise: Secondary | ICD-10-CM | POA: Diagnosis not present

## 2019-08-11 DIAGNOSIS — W19XXXA Unspecified fall, initial encounter: Secondary | ICD-10-CM | POA: Diagnosis not present

## 2019-08-11 DIAGNOSIS — T1490XA Injury, unspecified, initial encounter: Secondary | ICD-10-CM | POA: Diagnosis not present

## 2019-08-13 ENCOUNTER — Encounter (HOSPITAL_COMMUNITY): Payer: Self-pay

## 2019-08-13 ENCOUNTER — Other Ambulatory Visit: Payer: Self-pay

## 2019-08-13 ENCOUNTER — Inpatient Hospital Stay (HOSPITAL_COMMUNITY)
Admission: EM | Admit: 2019-08-13 | Discharge: 2019-08-22 | DRG: 177 | Disposition: A | Discharge status: Skilled Nursing Facility | Payer: Medicare Other | Attending: Internal Medicine | Admitting: Internal Medicine

## 2019-08-13 ENCOUNTER — Emergency Department (HOSPITAL_COMMUNITY): Payer: Medicare Other

## 2019-08-13 DIAGNOSIS — R0689 Other abnormalities of breathing: Secondary | ICD-10-CM | POA: Diagnosis not present

## 2019-08-13 DIAGNOSIS — R52 Pain, unspecified: Secondary | ICD-10-CM | POA: Diagnosis not present

## 2019-08-13 DIAGNOSIS — J1282 Pneumonia due to coronavirus disease 2019: Secondary | ICD-10-CM | POA: Diagnosis present

## 2019-08-13 DIAGNOSIS — I4581 Long QT syndrome: Secondary | ICD-10-CM | POA: Diagnosis not present

## 2019-08-13 DIAGNOSIS — Z79899 Other long term (current) drug therapy: Secondary | ICD-10-CM | POA: Diagnosis not present

## 2019-08-13 DIAGNOSIS — J069 Acute upper respiratory infection, unspecified: Secondary | ICD-10-CM | POA: Diagnosis not present

## 2019-08-13 DIAGNOSIS — R05 Cough: Secondary | ICD-10-CM | POA: Diagnosis not present

## 2019-08-13 DIAGNOSIS — R5381 Other malaise: Secondary | ICD-10-CM

## 2019-08-13 DIAGNOSIS — E782 Mixed hyperlipidemia: Secondary | ICD-10-CM | POA: Diagnosis present

## 2019-08-13 DIAGNOSIS — I129 Hypertensive chronic kidney disease with stage 1 through stage 4 chronic kidney disease, or unspecified chronic kidney disease: Secondary | ICD-10-CM | POA: Diagnosis not present

## 2019-08-13 DIAGNOSIS — J9601 Acute respiratory failure with hypoxia: Secondary | ICD-10-CM | POA: Diagnosis present

## 2019-08-13 DIAGNOSIS — U071 COVID-19: Secondary | ICD-10-CM | POA: Diagnosis not present

## 2019-08-13 DIAGNOSIS — R0602 Shortness of breath: Secondary | ICD-10-CM | POA: Diagnosis not present

## 2019-08-13 DIAGNOSIS — E785 Hyperlipidemia, unspecified: Secondary | ICD-10-CM | POA: Diagnosis present

## 2019-08-13 DIAGNOSIS — E7800 Pure hypercholesterolemia, unspecified: Secondary | ICD-10-CM | POA: Diagnosis present

## 2019-08-13 DIAGNOSIS — Z743 Need for continuous supervision: Secondary | ICD-10-CM | POA: Diagnosis not present

## 2019-08-13 DIAGNOSIS — R531 Weakness: Secondary | ICD-10-CM | POA: Diagnosis not present

## 2019-08-13 DIAGNOSIS — R262 Difficulty in walking, not elsewhere classified: Secondary | ICD-10-CM | POA: Diagnosis not present

## 2019-08-13 DIAGNOSIS — Z9181 History of falling: Secondary | ICD-10-CM | POA: Diagnosis not present

## 2019-08-13 DIAGNOSIS — L8990 Pressure ulcer of unspecified site, unspecified stage: Secondary | ICD-10-CM | POA: Insufficient documentation

## 2019-08-13 DIAGNOSIS — F329 Major depressive disorder, single episode, unspecified: Secondary | ICD-10-CM | POA: Diagnosis present

## 2019-08-13 DIAGNOSIS — I959 Hypotension, unspecified: Secondary | ICD-10-CM | POA: Diagnosis not present

## 2019-08-13 DIAGNOSIS — M6281 Muscle weakness (generalized): Secondary | ICD-10-CM | POA: Diagnosis not present

## 2019-08-13 DIAGNOSIS — I1310 Hypertensive heart and chronic kidney disease without heart failure, with stage 1 through stage 4 chronic kidney disease, or unspecified chronic kidney disease: Secondary | ICD-10-CM | POA: Diagnosis not present

## 2019-08-13 DIAGNOSIS — R9431 Abnormal electrocardiogram [ECG] [EKG]: Secondary | ICD-10-CM | POA: Diagnosis present

## 2019-08-13 DIAGNOSIS — N1831 Chronic kidney disease, stage 3a: Secondary | ICD-10-CM | POA: Diagnosis not present

## 2019-08-13 DIAGNOSIS — J449 Chronic obstructive pulmonary disease, unspecified: Secondary | ICD-10-CM | POA: Diagnosis not present

## 2019-08-13 DIAGNOSIS — I10 Essential (primary) hypertension: Secondary | ICD-10-CM | POA: Diagnosis not present

## 2019-08-13 DIAGNOSIS — T380X5A Adverse effect of glucocorticoids and synthetic analogues, initial encounter: Secondary | ICD-10-CM | POA: Diagnosis not present

## 2019-08-13 DIAGNOSIS — Z741 Need for assistance with personal care: Secondary | ICD-10-CM | POA: Diagnosis not present

## 2019-08-13 DIAGNOSIS — Z791 Long term (current) use of non-steroidal anti-inflammatories (NSAID): Secondary | ICD-10-CM | POA: Diagnosis not present

## 2019-08-13 DIAGNOSIS — Z7984 Long term (current) use of oral hypoglycemic drugs: Secondary | ICD-10-CM

## 2019-08-13 DIAGNOSIS — L89312 Pressure ulcer of right buttock, stage 2: Secondary | ICD-10-CM | POA: Diagnosis present

## 2019-08-13 DIAGNOSIS — J440 Chronic obstructive pulmonary disease with acute lower respiratory infection: Secondary | ICD-10-CM | POA: Diagnosis present

## 2019-08-13 HISTORY — DX: Pure hypercholesterolemia, unspecified: E78.00

## 2019-08-13 LAB — CBC WITH DIFFERENTIAL/PLATELET
Abs Immature Granulocytes: 0.03 10*3/uL (ref 0.00–0.07)
Basophils Absolute: 0 10*3/uL (ref 0.0–0.1)
Basophils Relative: 0 %
Eosinophils Absolute: 0 10*3/uL (ref 0.0–0.5)
Eosinophils Relative: 0 %
HCT: 40.5 % (ref 39.0–52.0)
Hemoglobin: 12.7 g/dL — ABNORMAL LOW (ref 13.0–17.0)
Immature Granulocytes: 0 %
Lymphocytes Relative: 18 %
Lymphs Abs: 1.5 10*3/uL (ref 0.7–4.0)
MCH: 32.9 pg (ref 26.0–34.0)
MCHC: 31.4 g/dL (ref 30.0–36.0)
MCV: 104.9 fL — ABNORMAL HIGH (ref 80.0–100.0)
Monocytes Absolute: 0.9 10*3/uL (ref 0.1–1.0)
Monocytes Relative: 10 %
Neutro Abs: 5.9 10*3/uL (ref 1.7–7.7)
Neutrophils Relative %: 72 %
Platelets: 165 10*3/uL (ref 150–400)
RBC: 3.86 MIL/uL — ABNORMAL LOW (ref 4.22–5.81)
RDW: 12.4 % (ref 11.5–15.5)
WBC: 8.4 10*3/uL (ref 4.0–10.5)
nRBC: 0 % (ref 0.0–0.2)

## 2019-08-13 LAB — LACTATE DEHYDROGENASE: LDH: 204 U/L — ABNORMAL HIGH (ref 98–192)

## 2019-08-13 LAB — COMPREHENSIVE METABOLIC PANEL
ALT: 30 U/L (ref 0–44)
AST: 55 U/L — ABNORMAL HIGH (ref 15–41)
Albumin: 3.6 g/dL (ref 3.5–5.0)
Alkaline Phosphatase: 65 U/L (ref 38–126)
Anion gap: 11 (ref 5–15)
BUN: 29 mg/dL — ABNORMAL HIGH (ref 8–23)
CO2: 21 mmol/L — ABNORMAL LOW (ref 22–32)
Calcium: 8.4 mg/dL — ABNORMAL LOW (ref 8.9–10.3)
Chloride: 103 mmol/L (ref 98–111)
Creatinine, Ser: 1.53 mg/dL — ABNORMAL HIGH (ref 0.61–1.24)
GFR calc Af Amer: 48 mL/min — ABNORMAL LOW (ref >60)
GFR calc non Af Amer: 41 mL/min — ABNORMAL LOW (ref >60)
Glucose, Bld: 86 mg/dL (ref 70–99)
Potassium: 4.1 mmol/L (ref 3.5–5.1)
Sodium: 135 mmol/L (ref 135–145)
Total Bilirubin: 0.7 mg/dL (ref 0.3–1.2)
Total Protein: 8.1 g/dL (ref 6.5–8.1)

## 2019-08-13 LAB — PROCALCITONIN: Procalcitonin: <0.1 ng/mL

## 2019-08-13 LAB — TROPONIN I (HIGH SENSITIVITY)
Troponin I (High Sensitivity): 17 ng/L (ref <18)
Troponin I (High Sensitivity): 17 ng/L (ref <18)

## 2019-08-13 LAB — C-REACTIVE PROTEIN: CRP: 7.1 mg/dL — ABNORMAL HIGH (ref <1.0)

## 2019-08-13 LAB — BRAIN NATRIURETIC PEPTIDE: B Natriuretic Peptide: 62 pg/mL (ref 0.0–100.0)

## 2019-08-13 LAB — POC SARS CORONAVIRUS 2 AG -  ED: SARS Coronavirus 2 Ag: POSITIVE — AB

## 2019-08-13 LAB — TRIGLYCERIDES: Triglycerides: 89 mg/dL (ref <150)

## 2019-08-13 LAB — FIBRINOGEN: Fibrinogen: 527 mg/dL — ABNORMAL HIGH (ref 210–475)

## 2019-08-13 LAB — FERRITIN: Ferritin: 215 ng/mL (ref 24–336)

## 2019-08-13 LAB — D-DIMER, QUANTITATIVE: D-Dimer, Quant: 2.56 ug/mL-FEU — ABNORMAL HIGH (ref 0.00–0.50)

## 2019-08-13 LAB — LACTIC ACID, PLASMA: Lactic Acid, Venous: 1.1 mmol/L (ref 0.5–1.9)

## 2019-08-13 MED ORDER — SODIUM CHLORIDE 0.9 % IV SOLN
200.0000 mg | Freq: Once | INTRAVENOUS | Status: AC
  Administered 2019-08-14: 200 mg via INTRAVENOUS
  Filled 2019-08-13: qty 40

## 2019-08-13 MED ORDER — DEXAMETHASONE 4 MG PO TABS
4.0000 mg | ORAL_TABLET | Freq: Every day | ORAL | Status: DC
  Administered 2019-08-13: 21:00:00 4 mg via ORAL
  Filled 2019-08-13: qty 1

## 2019-08-13 MED ORDER — SODIUM CHLORIDE 0.9 % IV SOLN
100.0000 mg | Freq: Every day | INTRAVENOUS | Status: DC
  Filled 2019-08-13 (×2): qty 20

## 2019-08-13 MED ORDER — LOSARTAN POTASSIUM 25 MG PO TABS: 100.0000 mg | ORAL_TABLET | Freq: Every day | ORAL | Status: DC

## 2019-08-13 MED ORDER — ESCITALOPRAM OXALATE 10 MG PO TABS
10.0000 mg | ORAL_TABLET | Freq: Every day | ORAL | Status: DC
  Administered 2019-08-14 – 2019-08-22 (×9): 10 mg via ORAL
  Filled 2019-08-13 (×9): qty 1

## 2019-08-13 MED ORDER — GUAIFENESIN-DM 100-10 MG/5ML PO SYRP: 10.0000 mL | ORAL_SOLUTION | ORAL | Status: DC | PRN

## 2019-08-13 MED ORDER — PANTOPRAZOLE SODIUM 40 MG PO TBEC
40.0000 mg | DELAYED_RELEASE_TABLET | Freq: Every day | ORAL | Status: DC
  Administered 2019-08-14 – 2019-08-22 (×9): 40 mg via ORAL
  Filled 2019-08-13 (×9): qty 1

## 2019-08-13 MED ORDER — HYDROCOD POLST-CPM POLST ER 10-8 MG/5ML PO SUER
5.0000 mL | Freq: Two times a day (BID) | ORAL | Status: DC | PRN
  Administered 2019-08-15 – 2019-08-21 (×5): 5 mL via ORAL
  Filled 2019-08-13 (×5): qty 5

## 2019-08-13 MED ORDER — ALBUTEROL SULFATE HFA 108 (90 BASE) MCG/ACT IN AERS
1.0000 | INHALATION_SPRAY | Freq: Four times a day (QID) | RESPIRATORY_TRACT | Status: DC | PRN
  Filled 2019-08-13: qty 6.7

## 2019-08-13 MED ORDER — ENOXAPARIN SODIUM 40 MG/0.4ML ~~LOC~~ SOLN
40.0000 mg | SUBCUTANEOUS | Status: DC
  Administered 2019-08-13 – 2019-08-21 (×9): 40 mg via SUBCUTANEOUS
  Filled 2019-08-13 (×8): qty 0.4

## 2019-08-13 MED ORDER — ZINC SULFATE 220 (50 ZN) MG PO CAPS
220.0000 mg | ORAL_CAPSULE | Freq: Every day | ORAL | Status: DC
  Administered 2019-08-13 – 2019-08-22 (×10): 220 mg via ORAL
  Filled 2019-08-13 (×9): qty 1

## 2019-08-13 MED ORDER — ASCORBIC ACID 500 MG PO TABS
500.0000 mg | ORAL_TABLET | Freq: Every day | ORAL | Status: DC
  Administered 2019-08-14 – 2019-08-22 (×9): 500 mg via ORAL
  Filled 2019-08-13 (×9): qty 1

## 2019-08-13 MED ORDER — SIMVASTATIN 20 MG PO TABS
20.0000 mg | ORAL_TABLET | Freq: Every day | ORAL | Status: DC
  Administered 2019-08-14 – 2019-08-21 (×8): 20 mg via ORAL
  Filled 2019-08-13 (×9): qty 1

## 2019-08-13 MED ORDER — IPRATROPIUM-ALBUTEROL 20-100 MCG/ACT IN AERS
4.0000 | INHALATION_SPRAY | Freq: Once | RESPIRATORY_TRACT | Status: AC
  Administered 2019-08-13: 16:00:00 4 via RESPIRATORY_TRACT
  Filled 2019-08-13 (×2): qty 4

## 2019-08-13 MED ORDER — ACETAMINOPHEN 500 MG PO TABS
1000.0000 mg | ORAL_TABLET | Freq: Two times a day (BID) | ORAL | Status: DC
  Administered 2019-08-14 – 2019-08-22 (×18): 1000 mg via ORAL
  Filled 2019-08-13 (×18): qty 2

## 2019-08-13 MED ORDER — FLUTICASONE PROPIONATE 50 MCG/ACT NA SUSP
2.0000 | Freq: Every day | NASAL | Status: DC | PRN
  Administered 2019-08-16 – 2019-08-17 (×2): 2 via NASAL
  Filled 2019-08-13: qty 16

## 2019-08-13 MED ORDER — ACETAMINOPHEN 325 MG PO TABS
650.0000 mg | ORAL_TABLET | Freq: Once | ORAL | Status: AC
  Administered 2019-08-13: 15:00:00 650 mg via ORAL

## 2019-08-13 MED ORDER — POLYETHYLENE GLYCOL 3350 17 GM/SCOOP PO POWD
17.0000 g | Freq: Every day | ORAL | Status: DC | PRN
  Filled 2019-08-13: qty 255

## 2019-08-13 NOTE — ED Provider Notes (Signed)
[facility] Provider Note   CSN: [id number] Arrival date & time: 08/13/19  1337     History Chief Complaint  Patient presents with  . Weakness    Casey Cooley is a 86 y.o. male.  HPI      Casey Cooley is a 86 y.o. male, with a history of hypercholesterolemia, presenting to the ED with generalized weakness for an unknown amount of time.  Patient states this has been present "for at least the last week."  Also notes nonproductive cough. He has had several ground-level falls due to his generalized weakness, but denies any acute injuries from them.  Denies known fever, shortness of breath, chest pain, abdominal pain, lower extremity edema/pain, N/V/D, head injury, neck/back pain, urinary symptoms, or any other complaints.    Past Medical History:  Diagnosis Date  . Hypercholesterolemia     Patient Active Problem List   Diagnosis Date Noted  . Facet degeneration of lumbar region 03/28/2018  . CHRONIC RHINITIS 10/08/2009  . HYPERLIPIDEMIA 05/31/2009  . HYPERTENSION 05/31/2009  . COUGH 05/31/2009  . RETINAL DETACHMENT, BILATERAL, HX OF 05/31/2009    Past Surgical History:  Procedure Laterality Date  . EYE SURGERY    . FOOT SURGERY         No family history on file.  Social History   Tobacco Use  . Smoking status: Never Smoker  . Smokeless tobacco: Never Used  Substance Use Topics  . Alcohol use: Not on file    Comment: occ  . Drug use: Never    Home Medications Prior to Admission medications   Medication Sig Start Date End Date Taking? Authorizing Provider  albuterol (VENTOLIN HFA) 108 (90 Base) MCG/ACT inhaler Inhale into the lungs. 05/15/16   [provider]  amoxicillin (AMOXIL) 500 MG capsule  09/29/17   [provider]  beclomethasone (QVAR) 40 MCG/ACT inhaler 2 PUFFS 2 TIMES A DAY 05/15/16   [provider]  cyclobenzaprine (FLEXERIL) 5 MG tablet Take by mouth. 05/07/17   [provider]    fluticasone (FLONASE) 50 MCG/ACT nasal spray USE 2 SPRAYS INTO EACH NOSTRIL ONCE DAILY 12/05/16   [provider]  glipiZIDE (GLUCOTROL XL) 2.5 MG 24 hr tablet TAKE 1 TABLET DAILY 04/14/17   [provider]  HYDROcodone-acetaminophen (NORCO/VICODIN) 5-325 MG tablet Take by mouth. 05/27/17   [provider]  Ipratropium-Albuterol (COMBIVENT RESPIMAT) 20-100 MCG/ACT AERS respimat Inhale into the lungs. 04/18/17   [provider]  latanoprost (XALATAN) 0.005 % ophthalmic solution  02/23/18   [provider]  losartan (COZAAR) 100 MG tablet  04/20/17   [provider]  Multiple Vitamin (THERA) TABS Take by mouth.    [provider]  omeprazole (PRILOSEC) 40 MG capsule Take by mouth. 06/18/17   [provider]  oxyCODONE-acetaminophen (PERCOCET/ROXICET) 5-325 MG tablet TAKE 1-2 TABLETS BY MOUTH EVERY 6 HOURS FOR 7 DAYS 10/30/17   [provider]  predniSONE (DELTASONE) 5 MG tablet Take 6-5-4-3-2-1 po qd 05/27/17   [provider]  simvastatin (ZOCOR) 20 MG tablet TAKE 1 TABLET DAILY. 08/21/16   [provider]  terbinafine (LAMISIL) 250 MG tablet Take by mouth.    [provider]  valsartan (DIOVAN) 160 MG tablet USE AS DIRECTED ONCE A DAY 11/20/16   [provider]    Allergies    Patient has no known allergies.  Review of Systems   Review of Systems  Constitutional: Negative for chills and fever.  Respiratory: Positive for cough. Negative for shortness of breath.   Cardiovascular: Negative for chest pain and leg swelling.  Gastrointestinal: Negative for abdominal pain, diarrhea, nausea and vomiting.  Genitourinary: Negative for dysuria, flank pain and hematuria.  Musculoskeletal: Negative for back pain and neck pain.  Neurological: Positive for weakness. Negative for dizziness, syncope and headaches.  All other systems reviewed and are negative.   Physical Exam Updated Vital  Signs BP 140/72   Pulse 73   Temp (!) 102.1 F (38.9 C) (Oral)   Resp 10   Ht 6\' 1"  (1.854 m)   SpO2 95%   BMI 30.08 kg/m   Physical Exam Vitals and nursing note reviewed.  Constitutional:      General: He is not in acute distress.    Appearance: He is well-developed. He is ill-appearing. He is not diaphoretic.  HENT:     Head: Normocephalic and atraumatic.     Mouth/Throat:     Mouth: Mucous membranes are moist.     Pharynx: Oropharynx is clear.  Eyes:     Conjunctiva/sclera: Conjunctivae normal.  Cardiovascular:     Rate and Rhythm: Normal rate and regular rhythm.     Pulses: Normal pulses.          Radial pulses are 2+ on the right side and 2+ on the left side.       Posterior tibial pulses are 2+ on the right side and 2+ on the left side.     Heart sounds: Normal heart sounds.     Comments: Tactile temperature in the extremities appropriate and equal bilaterally. Pulmonary:     Effort: Tachypnea and respiratory distress present.     Breath sounds: Wheezing and rhonchi present.     Comments: Conversationally dyspneic. Abdominal:     Palpations: Abdomen is soft.     Tenderness: There is no abdominal tenderness. There is no guarding.  Musculoskeletal:     Cervical back: Neck supple.     Right lower leg: No edema.     Left lower leg: No edema.     Comments: No tenderness or pain with range of motion to the major joints of the upper or lower extremities.  No deformity, swelling, or color change.  Normal motor function intact in all extremities. No midline spinal tenderness.   Overall trauma exam performed without any abnormalities noted other than those mentioned.  Lymphadenopathy:     Cervical: No cervical adenopathy.  Skin:    General: Skin is warm and dry.  Neurological:     Mental Status: He is alert.  Psychiatric:        Mood and Affect: Mood and affect normal.        Speech: Speech normal.        Behavior: Behavior normal.     ED Results / Procedures /  Treatments   Labs (all labs ordered are listed, but only abnormal results are displayed) Labs Reviewed  POC SARS CORONAVIRUS 2 AG -  ED - Abnormal; Notable for the following components:      Result Value   SARS Coronavirus 2 Ag POSITIVE (*)    All other components within normal limits  CULTURE, BLOOD (ROUTINE X 2)  CULTURE, BLOOD (ROUTINE X 2)  LACTIC ACID, PLASMA  LACTIC ACID, PLASMA  CBC WITH DIFFERENTIAL/PLATELET  COMPREHENSIVE METABOLIC PANEL  D-DIMER, QUANTITATIVE (NOT AT [facility])  PROCALCITONIN  LACTATE DEHYDROGENASE  FERRITIN  TRIGLYCERIDES  FIBRINOGEN  C-REACTIVE PROTEIN  BRAIN NATRIURETIC PEPTIDE  TROPONIN I (  HIGH SENSITIVITY)    EKG EKG Interpretation  Date/Time:  Saturday August 13 2019 13:43:18 EST Ventricular Rate:  77 PR Interval:    QRS Duration: 81 QT Interval:  541 QTC Calculation: 613 R Axis:   63 Text Interpretation: Sinus rhythm Short PR interval Abnormal R-wave progression, early transition Nonspecific T abnormalities, lateral leads Prolonged QT interval most likely Confirmed by Varney Biles [id number]) on 08/13/2019 3:37:08 PM   Radiology DG Chest Port 1 View  Result Date: 08/13/2019 CLINICAL DATA:  Cough, COVID-19 positive EXAM: PORTABLE CHEST 1 VIEW COMPARISON:  05/31/2009 FINDINGS: The heart size and mediastinal contours are within normal limits. Low lung volumes. Mild streaky opacity in the left lung base. No pleural effusion or pneumothorax. The visualized skeletal structures are unremarkable. IMPRESSION: Low lung volumes with streaky opacity in the left lung base, which could reflect atelectasis or infiltrate. Electronically Signed   By: Davina Poke D.O.   On: 08/13/2019 16:10    Procedures Procedures (including critical care time)  Medications Ordered in ED Medications  Ipratropium-Albuterol (COMBIVENT) respimat 4 puff (has no administration in time range)  acetaminophen (TYLENOL) tablet 650 mg (650 mg Oral Given 08/13/19 1504)    ED  Course  I have reviewed the triage vital signs and the nursing notes.  Pertinent labs & imaging results that were available during my care of the patient were reviewed by me and considered in my medical decision making (see chart for details).    MDM Rules/Calculators/A&P                      Patient presents with generalized weakness.  Covid positive. Generally weak on exam.  Ill-appearing with increased work of breathing, conversationally dyspneic.  End of shift patient care handoff report given to Domenic Moras, PA-C. Plan: Labs for the patient are pending.  Reassess after Combivent administration.  You may try to walk the patient with SPO2, though I suspect the patient will either be too weak to walk or will become hypoxic with ambulation, or both, and will need admission.  Findings and plan of care discussed with Varney Biles, MD.    Vitals:   08/13/19 1344 08/13/19 1400 08/13/19 1430 08/13/19 1500  BP: (!) 158/71 137/62 137/61 140/72  Pulse: 75 72 69 73  Resp: 20 (!) 21 (!) 23 10  Temp: (!) 102.1 F (38.9 C)     TempSrc: Oral     SpO2: 97% 91% 91% 95%  Height:         Final Clinical Impression(s) / ED Diagnoses Final diagnoses:  None    Rx / DC Orders ED Discharge Orders    None       Layla Maw 08/13/19 1632    Varney Biles, MD 08/15/19 1516

## 2019-08-13 NOTE — ED Notes (Signed)
Pts family request that social work be involved for possible assistance once he is discharged.

## 2019-08-13 NOTE — H&P (Signed)
History and Physical  Casey Cooley [id number] DOB: 01-29-1933 DOA: 08/13/2019  Referring physician: Domenic Moras, PA-C, ED provider PCP: Dione Housekeeper, MD  Outpatient Specialists:   Patient Coming From: Home  Chief Complaint: Weakness, shortness of breath  HPI: Casey Cooley is a 86 y.o. male with a history of hyperlipidemia, hypertension, asthma/COPD.  Patient seen for increasing weakness over the past several days.  Because of his weakness, he is had a difficult time walking and ambulating from room to room.  He gets short of breath with exertion.  Because of this, the patient has had a couple of falls at home.  His wife has dementia and he is the primary caregiver.  Denies cough, but has been having a fever.  While he has not been leaving his house, he has had family and friends over and does not wear a mask with them.  Emergency Department Course: White count 8.4, CRP 7.1, ferritin 215.  Lactic acid 1.1.  Chest x-ray shows streaky opacity in the left base.  Patient's O2 sats 90 to 95%.  With any prolonged conversation, his oxygen saturation drops to 88 to 90%.  Review of Systems:   Pt denies any chills, nausea, vomiting, diarrhea, constipation, abdominal pain, palpitations, headache, vision changes, lightheadedness, dizziness, melena, rectal bleeding.  Review of systems are otherwise negative  Past Medical History:  Diagnosis Date  . Hypercholesterolemia    Past Surgical History:  Procedure Laterality Date  . EYE SURGERY    . FOOT SURGERY     Social History:  reports that he has never smoked. He has never used smokeless tobacco. He reports that he does not use drugs. No history on file for alcohol. Patient lives at home  No Known Allergies  No family history on file.  Unknown by patient  Prior to Admission medications   Medication Sig Start Date End Date Taking? Authorizing Provider  acetaminophen (TYLENOL 8 HOUR ARTHRITIS PAIN) 650 MG CR tablet Take 1,300 mg by  mouth 2 (two) times daily.   Yes [provider]  albuterol (VENTOLIN HFA) 108 (90 Base) MCG/ACT inhaler Inhale 1-2 puffs into the lungs every 6 (six) hours as needed for wheezing or shortness of breath.  05/15/16  Yes [provider]  cholecalciferol (VITAMIN D3) 25 MCG (1000 UT) tablet Take 1,000 Units by mouth daily.   Yes [provider]  escitalopram (LEXAPRO) 10 MG tablet Take 10 mg by mouth daily. 07/18/19  Yes [provider]  fluticasone (FLONASE) 50 MCG/ACT nasal spray Place 2 sprays into both nostrils daily as needed for allergies or rhinitis.  12/05/16  Yes [provider]  latanoprost (XALATAN) 0.005 % ophthalmic solution Place 1 drop into both eyes at bedtime.  02/23/18  Yes [provider]  losartan (COZAAR) 100 MG tablet Take 100 mg by mouth daily.  04/20/17  Yes [provider]  meloxicam (MOBIC) 7.5 MG tablet Take 7.5 mg by mouth daily. *May take one tablet twice daily as prescribed 07/18/19  Yes [provider]  Multiple Vitamin (MULTIVITAMIN WITH MINERALS) TABS tablet Take 1 tablet by mouth daily.   Yes [provider]  Omeprazole-Sodium Bicarbonate (ZEGERID OTC) 20-1100 MG CAPS capsule Take 1 capsule by mouth daily before breakfast.   Yes [provider]  polyethylene glycol powder (GLYCOLAX/MIRALAX) 17 GM/SCOOP powder Take 17 g by mouth daily as needed for mild constipation or moderate constipation.   Yes [provider]  simvastatin (ZOCOR) 20 MG tablet Take 20  mg by mouth daily at 6 PM.  08/21/16  Yes [provider]    Physical Exam: BP 96/82   Pulse 74   Temp 99.7 F (37.6 C) (Oral)   Resp 18   Ht 6\' 1"  (1.854 m)   SpO2 94%   BMI 30.08 kg/m   . General: Elderly male. Awake and alert and oriented x3. No acute cardiopulmonary distress.  Marland Kitchen HEENT: Normocephalic atraumatic.  Right and left ears normal in appearance.  Pupils equal, round, reactive to light. Extraocular  muscles are intact. Sclerae anicteric and noninjected.  Moist mucosal membranes. No mucosal lesions.  . Neck: Neck supple without lymphadenopathy. No carotid bruits. No masses palpated.  . Cardiovascular: Regular rate with normal S1-S2 sounds. No murmurs, rubs, gallops auscultated. No JVD.  Marland Kitchen Respiratory: Rales in bases bilaterally.  No accessory muscle use. . Abdomen: Soft, nontender, nondistended. Active bowel sounds. No masses or hepatosplenomegaly  . Skin: No rashes, lesions, or ulcerations.  Dry, warm to touch. 2+ dorsalis pedis and radial pulses. . Musculoskeletal: No calf or leg pain. All major joints not erythematous nontender.  No upper or lower joint deformation.  Good ROM.  No contractures  . Psychiatric: Intact judgment and insight. Pleasant and cooperative. . Neurologic: No focal neurological deficits. Strength is 5/5 and symmetric in upper and lower extremities.  Cranial nerves II through XII are grossly intact.           Labs on Admission: I have personally reviewed following labs and imaging studies  CBC: Recent Labs  Lab 08/13/19 1533  WBC 8.4  NEUTROABS 5.9  HGB 12.7*  HCT 40.5  MCV 104.9*  PLT [id number]   Basic Metabolic Panel: Recent Labs  Lab 08/13/19 1533  NA 135  K 4.1  CL 103  CO2 21*  GLUCOSE 86  BUN 29*  CREATININE 1.53*  CALCIUM 8.4*   GFR: CrCl cannot be calculated (Unknown ideal weight.). Liver Function Tests: Recent Labs  Lab 08/13/19 1533  AST 55*  ALT 30  ALKPHOS 65  BILITOT 0.7  PROT 8.1  ALBUMIN 3.6   No results for input(s): LIPASE, AMYLASE in the last 168 hours. No results for input(s): AMMONIA in the last 168 hours. Coagulation Profile: No results for input(s): INR, PROTIME in the last 168 hours. Cardiac Enzymes: No results for input(s): CKTOTAL, CKMB, CKMBINDEX, TROPONINI in the last 168 hours. BNP (last 3 results) No results for input(s): PROBNP in the last 8760 hours. HbA1C: No results for input(s): HGBA1C in the last 72  hours. CBG: No results for input(s): GLUCAP in the last 168 hours. Lipid Profile: Recent Labs    08/13/19 1711  TRIG 89   Thyroid Function Tests: No results for input(s): TSH, T4TOTAL, FREET4, T3FREE, THYROIDAB in the last 72 hours. Anemia Panel: Recent Labs    08/13/19 1533  FERRITIN 215   Urine analysis: No results found for: COLORURINE, APPEARANCEUR, LABSPEC, PHURINE, GLUCOSEU, HGBUR, BILIRUBINUR, KETONESUR, PROTEINUR, UROBILINOGEN, NITRITE, LEUKOCYTESUR Sepsis Labs: @LABRCNTIP (procalcitonin:4,lacticidven:4) ) Recent Results (from the past 240 hour(s))  Blood Culture (routine x 2)     Status: None (Preliminary result)   Collection Time: 08/13/19  5:11 PM   Specimen: Left Antecubital; Blood  Result Value Ref Range Status   Specimen Description LEFT ANTECUBITAL  Final   Special Requests   Final    BOTTLES DRAWN AEROBIC AND ANAEROBIC Blood Culture adequate volume Performed at [facility], [address] [place], [place] [address]    Culture PENDING  Incomplete  Report Status PENDING  Incomplete     Radiological Exams on Admission: DG Chest Port 1 View  Result Date: 08/13/2019 CLINICAL DATA:  Cough, COVID-19 positive EXAM: PORTABLE CHEST 1 VIEW COMPARISON:  05/31/2009 FINDINGS: The heart size and mediastinal contours are within normal limits. Low lung volumes. Mild streaky opacity in the left lung base. No pleural effusion or pneumothorax. The visualized skeletal structures are unremarkable. IMPRESSION: Low lung volumes with streaky opacity in the left lung base, which could reflect atelectasis or infiltrate. Electronically Signed   By: Davina Poke D.O.   On: 08/13/2019 16:10    EKG: Independently reviewed.  Sinus rhythm with short PR interval.  No acute ST changes  Assessment/Plan: Active Problems:   Hyperlipemia   Essential hypertension   Acute respiratory disease due to COVID-19 virus    This patient was discussed with the ED physician, including pertinent  vitals, physical exam findings, labs, and imaging.  We also discussed care given by the ED provider.  1. Acute respiratory disease due to Covid a. Admit as patient having hypoxia with exertion and positive chest x-ray b. Decadron, remdesivir, vitamin C, zinc c. CBC, CMP, CRP, procalcitonin, D-dimer, mag, Phos in the morning 2. Hypertension a. Continue home medications 3. Hyperlipidemia  DVT prophylaxis: Lovenox Consultants: None Code Status: Full code Family Communication: None Disposition Plan: Pending   Truett Mainland, DO

## 2019-08-13 NOTE — ED Provider Notes (Signed)
Received signout at the beginning of shift.  Please refer to previous providers note for complete H&P.  An elderly 86 year old male lives at home with wife with history of dementia.  Patient here with cold symptoms including shortness of breath, and febrile with a temperature of 102.1.  He is globally weak and not stable for discharge however he at this time does not require supplemental oxygen at this time, however O2 was as low as 91% on RA during this ER visit.   6:55 PM Appreciate consultation from [facility] Dr. Nehemiah Settle who felt pt does not need hospital admission due to lack of oxygen requirement,but agrees to evaluate pt to help determine disposition.  I will also reach out to Care Management for potential outpatient assistance.  8:40 PM Dr. Nehemiah Settle has seen and evaluated pt and will admit pt as pt desats while having a conversation, needed supplemental oxygen.  BP 136/68   Pulse 70   Temp 99.7 F (37.6 C) (Oral)   Resp 19   Ht 6\' 1"  (1.854 m)   SpO2 94%   BMI 30.08 kg/m   Results for orders placed or performed during the hospital encounter of 08/13/19  Blood Culture (routine x 2)   Specimen: Left Antecubital; Blood  Result Value Ref Range   Specimen Description LEFT ANTECUBITAL    Special Requests      BOTTLES DRAWN AEROBIC AND ANAEROBIC Blood Culture adequate volume Performed at [facility], [address], [place], [place] [address]    Culture PENDING    Report Status PENDING   Lactic acid, plasma  Result Value Ref Range   Lactic Acid, Venous 1.1 0.5 - 1.9 mmol/L  CBC WITH DIFFERENTIAL  Result Value Ref Range   WBC 8.4 4.0 - 10.5 K/uL   RBC 3.86 (L) 4.22 - 5.81 MIL/uL   Hemoglobin 12.7 (L) 13.0 - 17.0 g/dL   HCT 40.5 39.0 - 52.0 %   MCV 104.9 (H) 80.0 - 100.0 fL   MCH 32.9 26.0 - 34.0 pg   MCHC 31.4 30.0 - 36.0 g/dL   RDW 12.4 11.5 - 15.5 %   Platelets 165 150 - 400 K/uL   nRBC 0.0 0.0 - 0.2 %   Neutrophils Relative % 72 %   Neutro Abs 5.9 1.7 - 7.7 K/uL    Lymphocytes Relative 18 %   Lymphs Abs 1.5 0.7 - 4.0 K/uL   Monocytes Relative 10 %   Monocytes Absolute 0.9 0.1 - 1.0 K/uL   Eosinophils Relative 0 %   Eosinophils Absolute 0.0 0.0 - 0.5 K/uL   Basophils Relative 0 %   Basophils Absolute 0.0 0.0 - 0.1 K/uL   Immature Granulocytes 0 %   Abs Immature Granulocytes 0.03 0.00 - 0.07 K/uL  Comprehensive metabolic panel  Result Value Ref Range   Sodium 135 135 - 145 mmol/L   Potassium 4.1 3.5 - 5.1 mmol/L   Chloride 103 98 - 111 mmol/L   CO2 21 (L) 22 - 32 mmol/L   Glucose, Bld 86 70 - 99 mg/dL   BUN 29 (H) 8 - 23 mg/dL   Creatinine, Ser 1.53 (H) 0.61 - 1.24 mg/dL   Calcium 8.4 (L) 8.9 - 10.3 mg/dL   Total Protein 8.1 6.5 - 8.1 g/dL   Albumin 3.6 3.5 - 5.0 g/dL   AST 55 (H) 15 - 41 U/L   ALT 30 0 - 44 U/L   Alkaline Phosphatase 65 38 - 126 U/L   Total Bilirubin 0.7 0.3 -  1.2 mg/dL   GFR calc non Af Amer 41 (L) >60 mL/min   GFR calc Af Amer 48 (L) >60 mL/min   Anion gap 11 5 - 15  D-dimer, quantitative  Result Value Ref Range   D-Dimer, Quant 2.56 (H) 0.00 - 0.50 ug/mL-FEU  Procalcitonin  Result Value Ref Range   Procalcitonin <0.10 ng/mL  Lactate dehydrogenase  Result Value Ref Range   LDH 204 (H) 98 - 192 U/L  Ferritin  Result Value Ref Range   Ferritin 215 24 - 336 ng/mL  Triglycerides  Result Value Ref Range   Triglycerides 89 <150 mg/dL  Fibrinogen  Result Value Ref Range   Fibrinogen 527 (H) 210 - 475 mg/dL  C-reactive protein  Result Value Ref Range   CRP 7.1 (H) <1.0 mg/dL  Brain natriuretic peptide  Result Value Ref Range   B Natriuretic Peptide 62.0 0.0 - 100.0 pg/mL  POC SARS Coronavirus 2 Ag-ED -  Result Value Ref Range   SARS Coronavirus 2 Ag POSITIVE (A) NEGATIVE  Troponin I (High Sensitivity)  Result Value Ref Range   Troponin I (High Sensitivity) 17 <18 ng/L  Troponin I (High Sensitivity)  Result Value Ref Range   Troponin I (High Sensitivity) 17 <18 ng/L   DG Chest Port 1 View  Result  Date: 08/13/2019 CLINICAL DATA:  Cough, COVID-19 positive EXAM: PORTABLE CHEST 1 VIEW COMPARISON:  05/31/2009 FINDINGS: The heart size and mediastinal contours are within normal limits. Low lung volumes. Mild streaky opacity in the left lung base. No pleural effusion or pneumothorax. The visualized skeletal structures are unremarkable. IMPRESSION: Low lung volumes with streaky opacity in the left lung base, which could reflect atelectasis or infiltrate. Electronically Signed   By: Davina Poke D.O.   On: 08/13/2019 16:10      Domenic Moras, PA-C 08/13/19 2041    Varney Biles, MD 08/15/19 [phone]

## 2019-08-13 NOTE — ED Notes (Addendum)
Pts niece is point of contact, she is currently taking care of pts wife who has dementia. Pt confirmed that niece is his care giver as well.

## 2019-08-13 NOTE — ED Triage Notes (Signed)
Pt reports generalized weakness and has had several falls over the past few days.  Reports pt fell early this morning.    Pt c/o pain all over.  Pt reports has had a cough.  Denies fever or sob.

## 2019-08-14 DIAGNOSIS — I10 Essential (primary) hypertension: Secondary | ICD-10-CM

## 2019-08-14 DIAGNOSIS — J069 Acute upper respiratory infection, unspecified: Secondary | ICD-10-CM

## 2019-08-14 DIAGNOSIS — U071 COVID-19: Principal | ICD-10-CM

## 2019-08-14 LAB — COMPREHENSIVE METABOLIC PANEL
ALT: 28 U/L (ref 0–44)
AST: 61 U/L — ABNORMAL HIGH (ref 15–41)
Albumin: 3.2 g/dL — ABNORMAL LOW (ref 3.5–5.0)
Alkaline Phosphatase: 57 U/L (ref 38–126)
Anion gap: 9 (ref 5–15)
BUN: 34 mg/dL — ABNORMAL HIGH (ref 8–23)
CO2: 24 mmol/L (ref 22–32)
Calcium: 8.4 mg/dL — ABNORMAL LOW (ref 8.9–10.3)
Chloride: 103 mmol/L (ref 98–111)
Creatinine, Ser: 1.62 mg/dL — ABNORMAL HIGH (ref 0.61–1.24)
GFR calc Af Amer: 45 mL/min — ABNORMAL LOW (ref >60)
GFR calc non Af Amer: 38 mL/min — ABNORMAL LOW (ref >60)
Glucose, Bld: 103 mg/dL — ABNORMAL HIGH (ref 70–99)
Potassium: 5.1 mmol/L (ref 3.5–5.1)
Sodium: 136 mmol/L (ref 135–145)
Total Bilirubin: 0.8 mg/dL (ref 0.3–1.2)
Total Protein: 7.3 g/dL (ref 6.5–8.1)

## 2019-08-14 LAB — CBC WITH DIFFERENTIAL/PLATELET
Abs Immature Granulocytes: 0.03 10*3/uL (ref 0.00–0.07)
Basophils Absolute: 0 10*3/uL (ref 0.0–0.1)
Basophils Relative: 0 %
Eosinophils Absolute: 0 10*3/uL (ref 0.0–0.5)
Eosinophils Relative: 0 %
HCT: 39.7 % (ref 39.0–52.0)
Hemoglobin: 12 g/dL — ABNORMAL LOW (ref 13.0–17.0)
Immature Granulocytes: 0 %
Lymphocytes Relative: 22 %
Lymphs Abs: 1.5 10*3/uL (ref 0.7–4.0)
MCH: 31.9 pg (ref 26.0–34.0)
MCHC: 30.2 g/dL (ref 30.0–36.0)
MCV: 105.6 fL — ABNORMAL HIGH (ref 80.0–100.0)
Monocytes Absolute: 0.3 10*3/uL (ref 0.1–1.0)
Monocytes Relative: 4 %
Neutro Abs: 5 10*3/uL (ref 1.7–7.7)
Neutrophils Relative %: 74 %
Platelets: 156 10*3/uL (ref 150–400)
RBC: 3.76 MIL/uL — ABNORMAL LOW (ref 4.22–5.81)
RDW: 12.2 % (ref 11.5–15.5)
WBC: 6.8 10*3/uL (ref 4.0–10.5)
nRBC: 0 % (ref 0.0–0.2)

## 2019-08-14 LAB — PHOSPHORUS: Phosphorus: 4.5 mg/dL (ref 2.5–4.6)

## 2019-08-14 LAB — D-DIMER, QUANTITATIVE: D-Dimer, Quant: 1.9 ug/mL-FEU — ABNORMAL HIGH (ref 0.00–0.50)

## 2019-08-14 LAB — C-REACTIVE PROTEIN: CRP: 10.2 mg/dL — ABNORMAL HIGH (ref <1.0)

## 2019-08-14 LAB — FERRITIN: Ferritin: 274 ng/mL (ref 24–336)

## 2019-08-14 LAB — MAGNESIUM: Magnesium: 2.2 mg/dL (ref 1.7–2.4)

## 2019-08-14 LAB — ABO/RH: ABO/RH(D): A POS

## 2019-08-14 MED ORDER — POLYETHYLENE GLYCOL 3350 17 G PO PACK
17.0000 g | PACK | Freq: Every day | ORAL | Status: DC | PRN
  Administered 2019-08-22: 09:00:00 17 g via ORAL
  Filled 2019-08-14: qty 1

## 2019-08-14 MED ORDER — LACTATED RINGERS IV SOLN
INTRAVENOUS | Status: AC
  Administered 2019-08-14: 09:00:00 100 mL/h via INTRAVENOUS

## 2019-08-14 MED ORDER — DEXAMETHASONE SODIUM PHOSPHATE 10 MG/ML IJ SOLN
6.0000 mg | INTRAMUSCULAR | Status: DC
  Administered 2019-08-14 – 2019-08-18 (×5): 6 mg via INTRAVENOUS
  Filled 2019-08-14 (×5): qty 1

## 2019-08-14 MED ORDER — SODIUM CHLORIDE 0.9 % IV SOLN
100.0000 mg | Freq: Every evening | INTRAVENOUS | Status: AC
  Administered 2019-08-14 – 2019-08-17 (×4): 100 mg via INTRAVENOUS
  Filled 2019-08-14: qty 100
  Filled 2019-08-14 (×3): qty 20

## 2019-08-14 MED ORDER — SODIUM CHLORIDE 0.9 % IV SOLN
INTRAVENOUS | Status: DC | PRN
  Administered 2019-08-14: 18:00:00 250 mL via INTRAVENOUS

## 2019-08-14 NOTE — ED Notes (Signed)
Pt assisted with breakfast. Denies any complaints, does seem a little confused/disoriented when completing tasks (trying to eat with mask on) but is oriented to place, time, and person.

## 2019-08-14 NOTE — Progress Notes (Signed)
PROGRESS NOTE    Casey Cooley  [id number] DOB: 01-Dec-1932 DOA: 08/13/2019 PCP: Dione Housekeeper, MD   Brief Narrative:  Per HPI: Casey Cooley is a 86 y.o. male with a history of hyperlipidemia, hypertension, asthma/COPD.  Patient seen for increasing weakness over the past several days.  Because of his weakness, he is had a difficult time walking and ambulating from room to room.  He gets short of breath with exertion.  Because of this, the patient has had a couple of falls at home.  His wife has dementia and he is the primary caregiver.  Denies cough, but has been having a fever.  While he has not been leaving his house, he has had family and friends over and does not wear a mask with them.  1/3: Patient was admitted with acute hypoxemic respiratory failure secondary to COVID-19 pneumonia.  He appears to be on 2 L this morning and is quite somnolent.  CRP is elevated and blood pressures are soft.  We will plan to hold losartan and increase dexamethasone to 6 mg and change to IV.  Continue to follow inflammatory markers.  Assessment & Plan:   Active Problems:   Hyperlipemia   Essential hypertension   Acute respiratory disease due to COVID-19 virus   Acute hypoxemic respiratory failure secondary to COVID-19 pneumonia -Continue remdesivir -Decadron increased to 6 mg IV -Monitor inflammatory markers -Continue vitamin C and zinc -Wean oxygen as tolerated -Isolation precautions  Hypertension -Currently with soft blood pressure readings -We will hold home losartan and monitor carefully -Start gentle IV fluid  Dyslipidemia -Continue statin   DVT prophylaxis: Lovenox Code Status: Full Family Communication: We will plan to call niece Disposition Plan: Continue current treatment for COVID-19 pneumonia and wean oxygen as tolerated.  Monitor inflammatory markers.   Consultants:   None  Procedures:   None  Antimicrobials:  Anti-infectives (From admission, onward)   Start     Dose/Rate Route Frequency Ordered Stop   08/14/19 1800  remdesivir 100 mg in sodium chloride 0.9 % 100 mL IVPB     100 mg 200 mL/hr over 30 Minutes Intravenous Every evening 08/14/19 0726 08/18/19 1759   08/14/19 1000  remdesivir 100 mg in sodium chloride 0.9 % 100 mL IVPB  Status:  Discontinued     100 mg 200 mL/hr over 30 Minutes Intravenous Daily 08/13/19 2046 08/14/19 0726   08/13/19 2100  remdesivir 200 mg in sodium chloride 0.9% 250 mL IVPB     200 mg 580 mL/hr over 30 Minutes Intravenous Once 08/13/19 2046 08/14/19 0047       Subjective: Patient seen and evaluated today with no new acute complaints or concerns. No acute concerns or events noted overnight.  He is quite somnolent this morning, but arousable.  Objective: Vitals:   08/14/19 0615 08/14/19 0630 08/14/19 0645 08/14/19 0700  BP:  (!) 100/53  (!) 97/48  Pulse: 66 (!) 55 (!) 56 (!) 58  Resp: 13 (!) 33 16 17  Temp:      TempSrc:      SpO2: 96% 93% 94% 94%  Height:       No intake or output data in the 24 hours ending 08/14/19 0853 Filed Weights    Examination:  General exam: Appears calm and comfortable  Respiratory system: Clear to auscultation. Respiratory effort normal.  Currently on 2 L nasal cannula oxygen. Cardiovascular system: S1 & S2 heard, RRR. No JVD, murmurs, rubs, gallops or clicks. No pedal edema. Gastrointestinal system:  Abdomen is nondistended, soft and nontender. No organomegaly or masses felt. Normal bowel sounds heard. Central nervous system: Somnolent but arousable Extremities: No edema Skin: No rashes, lesions or ulcers Psychiatry: Cannot be assessed given patient condition    Data Reviewed: I have personally reviewed following labs and imaging studies  CBC: Recent Labs  Lab 08/13/19 1533 08/14/19 0601  WBC 8.4 6.8  NEUTROABS 5.9 5.0  HGB 12.7* 12.0*  HCT 40.5 39.7  MCV 104.9* 105.6*  PLT 165 [id number]   Basic Metabolic Panel: Recent Labs  Lab 08/13/19 1533  08/14/19 0601  NA 135 136  K 4.1 5.1  CL 103 103  CO2 21* 24  GLUCOSE 86 103*  BUN 29* 34*  CREATININE 1.53* 1.62*  CALCIUM 8.4* 8.4*  MG  --  2.2  PHOS  --  4.5   GFR: CrCl cannot be calculated (Unknown ideal weight.). Liver Function Tests: Recent Labs  Lab 08/13/19 1533 08/14/19 0601  AST 55* 61*  ALT 30 28  ALKPHOS 65 57  BILITOT 0.7 0.8  PROT 8.1 7.3  ALBUMIN 3.6 3.2*   No results for input(s): LIPASE, AMYLASE in the last 168 hours. No results for input(s): AMMONIA in the last 168 hours. Coagulation Profile: No results for input(s): INR, PROTIME in the last 168 hours. Cardiac Enzymes: No results for input(s): CKTOTAL, CKMB, CKMBINDEX, TROPONINI in the last 168 hours. BNP (last 3 results) No results for input(s): PROBNP in the last 8760 hours. HbA1C: No results for input(s): HGBA1C in the last 72 hours. CBG: No results for input(s): GLUCAP in the last 168 hours. Lipid Profile: Recent Labs    08/13/19 1711  TRIG 89   Thyroid Function Tests: No results for input(s): TSH, T4TOTAL, FREET4, T3FREE, THYROIDAB in the last 72 hours. Anemia Panel: Recent Labs    08/13/19 1533 08/14/19 0601  FERRITIN 215 274   Sepsis Labs: Recent Labs  Lab 08/13/19 1533 08/13/19 1711  PROCALCITON <0.10  --   LATICACIDVEN  --  1.1    Recent Results (from the past 240 hour(s))  Blood Culture (routine x 2)     Status: None (Preliminary result)   Collection Time: 08/13/19  5:11 PM   Specimen: Left Antecubital; Blood  Result Value Ref Range Status   Specimen Description LEFT ANTECUBITAL  Final   Special Requests   Final    BOTTLES DRAWN AEROBIC AND ANAEROBIC Blood Culture adequate volume Performed at [facility], [address] [place], [place] [address]    Culture PENDING  Incomplete   Report Status PENDING  Incomplete         Radiology Studies: DG Chest Port 1 View  Result Date: 08/13/2019 CLINICAL DATA:  Cough, COVID-19 positive EXAM: PORTABLE CHEST 1 VIEW  COMPARISON:  05/31/2009 FINDINGS: The heart size and mediastinal contours are within normal limits. Low lung volumes. Mild streaky opacity in the left lung base. No pleural effusion or pneumothorax. The visualized skeletal structures are unremarkable. IMPRESSION: Low lung volumes with streaky opacity in the left lung base, which could reflect atelectasis or infiltrate. Electronically Signed   By: Davina Poke D.O.   On: 08/13/2019 16:10        Scheduled Meds: . acetaminophen  1,000 mg Oral BID  . vitamin C  500 mg Oral Daily  . dexamethasone (DECADRON) injection  6 mg Intravenous Q24H  . enoxaparin (LOVENOX) injection  40 mg Subcutaneous Q24H  . escitalopram  10 mg Oral Daily  . pantoprazole  40 mg Oral  Daily  . simvastatin  20 mg Oral q1800  . zinc sulfate  220 mg Oral Daily   Continuous Infusions: . lactated ringers    . remdesivir 100 mg in NS 100 mL       LOS: 1 day    Time spent: 30 minutes    Jaylina Ramdass Darleen Crocker, DO [facility] Pager [phone]  If 7PM-7AM, please contact night-coverage [email or web address] Password [facility] 08/14/2019, 8:53 AM

## 2019-08-15 LAB — CBC WITH DIFFERENTIAL/PLATELET
Abs Immature Granulocytes: 0.16 10*3/uL — ABNORMAL HIGH (ref 0.00–0.07)
Basophils Absolute: 0 10*3/uL (ref 0.0–0.1)
Basophils Relative: 0 %
Eosinophils Absolute: 0 10*3/uL (ref 0.0–0.5)
Eosinophils Relative: 0 %
HCT: 36.1 % — ABNORMAL LOW (ref 39.0–52.0)
Hemoglobin: 11.7 g/dL — ABNORMAL LOW (ref 13.0–17.0)
Immature Granulocytes: 1 %
Lymphocytes Relative: 10 %
Lymphs Abs: 1.7 10*3/uL (ref 0.7–4.0)
MCH: 33 pg (ref 26.0–34.0)
MCHC: 32.4 g/dL (ref 30.0–36.0)
MCV: 101.7 fL — ABNORMAL HIGH (ref 80.0–100.0)
Monocytes Absolute: 1 10*3/uL (ref 0.1–1.0)
Monocytes Relative: 6 %
Neutro Abs: 13.6 10*3/uL — ABNORMAL HIGH (ref 1.7–7.7)
Neutrophils Relative %: 83 %
Platelets: 162 10*3/uL (ref 150–400)
RBC: 3.55 MIL/uL — ABNORMAL LOW (ref 4.22–5.81)
RDW: 11.9 % (ref 11.5–15.5)
WBC: 16.4 10*3/uL — ABNORMAL HIGH (ref 4.0–10.5)
nRBC: 0 % (ref 0.0–0.2)

## 2019-08-15 LAB — MAGNESIUM: Magnesium: 2.2 mg/dL (ref 1.7–2.4)

## 2019-08-15 LAB — COMPREHENSIVE METABOLIC PANEL
ALT: 38 U/L (ref 0–44)
AST: 63 U/L — ABNORMAL HIGH (ref 15–41)
Albumin: 2.9 g/dL — ABNORMAL LOW (ref 3.5–5.0)
Alkaline Phosphatase: 55 U/L (ref 38–126)
Anion gap: 9 (ref 5–15)
BUN: 44 mg/dL — ABNORMAL HIGH (ref 8–23)
CO2: 23 mmol/L (ref 22–32)
Calcium: 8.3 mg/dL — ABNORMAL LOW (ref 8.9–10.3)
Chloride: 103 mmol/L (ref 98–111)
Creatinine, Ser: 1.53 mg/dL — ABNORMAL HIGH (ref 0.61–1.24)
GFR calc Af Amer: 48 mL/min — ABNORMAL LOW (ref >60)
GFR calc non Af Amer: 41 mL/min — ABNORMAL LOW (ref >60)
Glucose, Bld: 135 mg/dL — ABNORMAL HIGH (ref 70–99)
Potassium: 4.1 mmol/L (ref 3.5–5.1)
Sodium: 135 mmol/L (ref 135–145)
Total Bilirubin: 0.5 mg/dL (ref 0.3–1.2)
Total Protein: 7 g/dL (ref 6.5–8.1)

## 2019-08-15 LAB — C-REACTIVE PROTEIN: CRP: 6.8 mg/dL — ABNORMAL HIGH (ref <1.0)

## 2019-08-15 LAB — FERRITIN: Ferritin: 398 ng/mL — ABNORMAL HIGH (ref 24–336)

## 2019-08-15 LAB — PHOSPHORUS: Phosphorus: 2.6 mg/dL (ref 2.5–4.6)

## 2019-08-15 LAB — D-DIMER, QUANTITATIVE: D-Dimer, Quant: 1.69 ug/mL-FEU — ABNORMAL HIGH (ref 0.00–0.50)

## 2019-08-15 MED ORDER — ZOLPIDEM TARTRATE 5 MG PO TABS
2.5000 mg | ORAL_TABLET | Freq: Once | ORAL | Status: AC
  Administered 2019-08-16: 02:00:00 2.5 mg via ORAL
  Filled 2019-08-15: qty 1

## 2019-08-15 NOTE — Plan of Care (Signed)
  Problem: Acute Rehab PT Goals(only PT should resolve) Goal: Pt Will Go Supine/Side To Sit Outcome: Progressing Flowsheets (Taken 08/15/2019 1027) Pt will go Supine/Side to Sit:  with min guard assist  with minimal assist Goal: Patient Will Transfer Sit To/From Stand Outcome: Progressing Flowsheets (Taken 08/15/2019 1027) Patient will transfer sit to/from stand:  with min guard assist  with minimal assist Goal: Pt Will Transfer Bed To Chair/Chair To Bed Outcome: Progressing Flowsheets (Taken 08/15/2019 1027) Pt will Transfer Bed to Chair/Chair to Bed:  min guard assist  with min assist Goal: Pt Will Ambulate Outcome: Progressing Flowsheets (Taken 08/15/2019 1027) Pt will Ambulate:  25 feet  with minimal assist  with rolling walker   10:28 AM, 08/15/19 Lonell Grandchild, MPT Physical Therapist with [facility] 336 [phone] office [phone] mobile phone

## 2019-08-15 NOTE — Evaluation (Signed)
Physical Therapy Evaluation Patient Details Name: Casey Cooley MRN: [id number] DOB: 1933-06-19 Today's Date: 08/15/2019   History of Present Illness  Casey Cooley is a 86 y.o. male with a history of hyperlipidemia, hypertension, asthma/COPD.  Patient seen for increasing weakness over the past several days.  Because of his weakness, he is had a difficult time walking and ambulating from room to room.  He gets short of breath with exertion.  Because of this, the patient has had a couple of falls at home.  His wife has dementia and he is the primary caregiver.  Denies cough, but has been having a fever.  While he has not been leaving his house, he has had family and friends over and does not wear a mask with them.    Clinical Impression  Patient has difficulty sitting up at bedside due to generalized weakness, requires verbal/tactile cueing for proper hand placement during supine to sitting with fair carryover, limited to a few unsteady slow labored steps at bedside due to fall risk and BLE and tolerated sitting up in chair after therapy - nursing staff notified.  Patient will benefit from continued physical therapy in hospital and recommended venue below to increase strength, balance, endurance for safe ADLs and gait.  Patient on room air throughout visit with SpO2 at 91-94% while standing taking steps at bedside.    Follow Up Recommendations SNF    Equipment Recommendations  None recommended by PT    Recommendations for Other Services       Precautions / Restrictions Precautions Precautions: Fall Restrictions Weight Bearing Restrictions: No      Mobility  Bed Mobility Overal bed mobility: Needs Assistance Bed Mobility: Supine to Sit     Supine to sit: Min assist;Mod assist     General bed mobility comments: slow labored movement  Transfers Overall transfer level: Needs assistance Equipment used: Rolling walker (2 wheeled) Transfers: Sit to/from [employer] Sit to Stand: Min assist Stand pivot transfers: Min assist       General transfer comment: has difficulty with sit to stands due to BLE weakness  Ambulation/Gait Ambulation/Gait assistance: Min assist;Mod assist Gait Distance (Feet): 5 Feet Assistive device: Rolling walker (2 wheeled) Gait Pattern/deviations: Decreased step length - right;Decreased step length - left;Decreased stride length Gait velocity: slow   General Gait Details: limited to 5-6 slow unsteady labored steps at bedside due to c/o fatigue and generalized weakness  Stairs            Wheelchair Mobility    Modified Rankin (Stroke Patients Only)       Balance Overall balance assessment: Needs assistance Sitting-balance support: Feet supported;No upper extremity supported Sitting balance-Leahy Scale: Good Sitting balance - Comments: seated at EOB   Standing balance support: During functional activity;Bilateral upper extremity supported Standing balance-Leahy Scale: Fair Standing balance comment: using RW                             Pertinent Vitals/Pain Pain Assessment: No/denies pain    Home Living Family/patient expects to be discharged to:: Private residence Living Arrangements: Spouse/significant other Available Help at Discharge: Family;Available PRN/intermittently Type of Home: House Home Access: Ramped entrance     Home Layout: Two level;Laundry or work area in basement;Able to live on main level with bedroom/bathroom Home Equipment: Environmental consultant - 2 wheels      Prior Function Level of Independence: Independent with assistive device(s)  Comments: household ambulator with RW, his spouse has demential and unable to help patient     Hand Dominance        Extremity/Trunk Assessment   Upper Extremity Assessment Upper Extremity Assessment: Generalized weakness    Lower Extremity Assessment Lower Extremity Assessment: Generalized weakness    Cervical /  Trunk Assessment Cervical / Trunk Assessment: Normal  Communication   Communication: No difficulties  Cognition Arousal/Alertness: Awake/alert Behavior During Therapy: WFL for tasks assessed/performed;Anxious Overall Cognitive Status: Within Functional Limits for tasks assessed                                        General Comments      Exercises     Assessment/Plan    PT Assessment Patient needs continued PT services  PT Problem List Decreased strength;Decreased activity tolerance;Decreased balance;Decreased mobility       PT Treatment Interventions Gait training;Functional mobility training;Therapeutic activities;Therapeutic exercise;Patient/family education    PT Goals (Current goals can be found in the Care Plan section)  Acute Rehab PT Goals Patient Stated Goal: return home with family to assist PT Goal Formulation: With patient Time For Goal Achievement: 08/29/19 Potential to Achieve Goals: Good    Frequency Min 3X/week   Barriers to discharge        Co-evaluation               AM-PAC PT "6 Clicks" Mobility  Outcome Measure Help needed turning from your back to your side while in a flat bed without using bedrails?: A Little Help needed moving from lying on your back to sitting on the side of a flat bed without using bedrails?: A Lot Help needed moving to and from a bed to a chair (including a wheelchair)?: A Lot Help needed standing up from a chair using your arms (e.g., wheelchair or bedside chair)?: A Lot Help needed to walk in hospital room?: A Lot Help needed climbing 3-5 steps with a railing? : A Lot 6 Click Score: 13    End of Session   Activity Tolerance: Patient tolerated treatment well;Patient limited by fatigue Patient left: in chair;with call bell/phone within reach Nurse Communication: Mobility status PT Visit Diagnosis: Unsteadiness on feet (R26.81);Other abnormalities of gait and mobility (R26.89);Muscle weakness  (generalized) (M62.81)    Time: [id number] PT Time Calculation (min) (ACUTE ONLY): 30 min   Charges:   PT Evaluation $PT Eval Moderate Complexity: 1 Mod PT Treatments $Therapeutic Activity: 23-37 mins        10:26 AM, 08/15/19 Lonell Grandchild, MPT Physical Therapist with [facility] [phone] office [phone] mobile phone

## 2019-08-15 NOTE — Progress Notes (Signed)
PROGRESS NOTE    Casey Cooley  [id number] DOB: 04/12/1933 DOA: 08/13/2019 PCP: Dione Housekeeper, MD   Brief Narrative:  Per HPI: Casey Cooley a 86 y.o.malewith a history of hyperlipidemia, hypertension, asthma/COPD. Patient seen for increasing weakness over the past several days. Because of his weakness, he is had a difficult time walking and ambulating from room to room. He gets short of breath with exertion. Because of this, the patient has had a couple of falls at home. His wife has dementia and he is the primary caregiver. Denies cough, but has been having a fever. While he has not been leaving his house, he has had family and friends over and does not wear a mask with them.  1/3: Patient was admitted with acute hypoxemic respiratory failure secondary to COVID-19 pneumonia.  He appears to be on 2 L this morning and is quite somnolent.  CRP is elevated and blood pressures are soft.  We will plan to hold losartan and increase dexamethasone to 6 mg and change to IV.  Continue to follow inflammatory markers.  1/4: Patient doing well on 1L [place]. PT evaluation due to fall at home with recommendations for SNF, but patient refuses. He cares for wife with dementia at home and has no transport to get to outpatient infusions. He will need to remain hospitalized for his full COVID treatment.  Assessment & Plan:   Active Problems:   Hyperlipemia   Essential hypertension   Acute respiratory disease due to COVID-19 virus   Acute hypoxemic respiratory failure secondary to COVID-19 pneumonia -Continue remdesivir day 3/5 for full treatment in hospital -Decadron 6 mg IV daily -Monitor inflammatory markers with downward trend noted, but with leukocytosis likely related to steroids -Continue vitamin C and zinc -Wean oxygen as tolerated, now down to 1L -Isolation precautions  Hypertension -Currently with soft blood pressure readings -We will hold home losartan and monitor  carefully  Dyslipidemia -Continue statin  Asthma/COPD -inhaler prn, no active symptoms otherwise noted   DVT prophylaxis: Lovenox Code Status: Full Family Communication: None Disposition Plan: Continue current treatment for COVID-19 pneumonia and wean oxygen as tolerated.  Monitor inflammatory markers.   Consultants:   None  Procedures:   None  Antimicrobials:  Anti-infectives (From admission, onward)   Start     Dose/Rate Route Frequency Ordered Stop   08/14/19 1800  remdesivir 100 mg in sodium chloride 0.9 % 100 mL IVPB     100 mg 200 mL/hr over 30 Minutes Intravenous Every evening 08/14/19 0726 08/18/19 1759   08/14/19 1000  remdesivir 100 mg in sodium chloride 0.9 % 100 mL IVPB  Status:  Discontinued     100 mg 200 mL/hr over 30 Minutes Intravenous Daily 08/13/19 2046 08/14/19 0726   08/13/19 2100  remdesivir 200 mg in sodium chloride 0.9% 250 mL IVPB     200 mg 580 mL/hr over 30 Minutes Intravenous Once 08/13/19 2046 08/14/19 0047       Subjective: Patient seen and evaluated today with no new acute complaints or concerns. No acute concerns or events noted overnight.  Objective: Vitals:   08/14/19 2144 08/15/19 0603 08/15/19 0854 08/15/19 1524  BP: (!) 112/58 (!) 129/56  (!) 127/56  Pulse: 65 (!) 51 66 60  Resp: 20 18 18 18   Temp: 98.5 F (36.9 C) 98 F (36.7 C)  98.6 F (37 C)  TempSrc: Oral Oral  Oral  SpO2: 93% 93% 94% 94%  Weight:      Height:  Intake/Output Summary (Last 24 hours) at 08/15/2019 1550 Last data filed at 08/15/2019 0500 Gross per 24 hour  Intake 300 ml  Output 700 ml  Net -400 ml   Filed Weights   08/14/19 1431  Weight: 102 kg    Examination:  General exam: Appears calm and comfortable  Respiratory system: Clear to auscultation. Respiratory effort normal. Currently on 1L [place]. Cardiovascular system: S1 & S2 heard, RRR. No JVD, murmurs, rubs, gallops or clicks. No pedal edema. Gastrointestinal system: Abdomen is  nondistended, soft and nontender. No organomegaly or masses felt. Normal bowel sounds heard. Central nervous system: Alert and oriented. No focal neurological deficits. Extremities: Symmetric 5 x 5 power. Skin: No rashes, lesions or ulcers Psychiatry: Judgement and insight appear normal. Mood & affect appropriate.     Data Reviewed: I have personally reviewed following labs and imaging studies  CBC: Recent Labs  Lab 08/13/19 1533 08/14/19 0601 08/15/19 0718  WBC 8.4 6.8 16.4*  NEUTROABS 5.9 5.0 13.6*  HGB 12.7* 12.0* 11.7*  HCT 40.5 39.7 36.1*  MCV 104.9* 105.6* 101.7*  PLT 165 156 0000000   Basic Metabolic Panel: Recent Labs  Lab 08/13/19 1533 08/14/19 0601 08/15/19 0718  NA 135 136 135  K 4.1 5.1 4.1  CL 103 103 103  CO2 21* 24 23  GLUCOSE 86 103* 135*  BUN 29* 34* 44*  CREATININE 1.53* 1.62* 1.53*  CALCIUM 8.4* 8.4* 8.3*  MG  --  2.2 2.2  PHOS  --  4.5 2.6   GFR: Estimated Creatinine Clearance: 45.1 mL/min (A) (by C-G formula based on SCr of 1.53 mg/dL (H)). Liver Function Tests: Recent Labs  Lab 08/13/19 1533 08/14/19 0601 08/15/19 0718  AST 55* 61* 63*  ALT 30 28 38  ALKPHOS 65 57 55  BILITOT 0.7 0.8 0.5  PROT 8.1 7.3 7.0  ALBUMIN 3.6 3.2* 2.9*   No results for input(s): LIPASE, AMYLASE in the last 168 hours. No results for input(s): AMMONIA in the last 168 hours. Coagulation Profile: No results for input(s): INR, PROTIME in the last 168 hours. Cardiac Enzymes: No results for input(s): CKTOTAL, CKMB, CKMBINDEX, TROPONINI in the last 168 hours. BNP (last 3 results) No results for input(s): PROBNP in the last 8760 hours. HbA1C: No results for input(s): HGBA1C in the last 72 hours. CBG: No results for input(s): GLUCAP in the last 168 hours. Lipid Profile: Recent Labs    08/13/19 1711  TRIG 89   Thyroid Function Tests: No results for input(s): TSH, T4TOTAL, FREET4, T3FREE, THYROIDAB in the last 72 hours. Anemia Panel: Recent Labs     08/14/19 0601 08/15/19 0718  FERRITIN 274 398*   Sepsis Labs: Recent Labs  Lab 08/13/19 1533 08/13/19 1711  PROCALCITON <0.10  --   LATICACIDVEN  --  1.1    Recent Results (from the past 240 hour(s))  Blood Culture (routine x 2)     Status: None (Preliminary result)   Collection Time: 08/13/19  3:38 PM   Specimen: BLOOD  Result Value Ref Range Status   Specimen Description BLOOD  Final   Special Requests NONE  Final   Culture   Final    NO GROWTH 2 DAYS Performed at [facility], [address], [place] [place] [employer], [place] [address]    Report Status PENDING  Incomplete  Blood Culture (routine x 2)     Status: None (Preliminary result)   Collection Time: 08/13/19  5:11 PM   Specimen: Left Antecubital; Blood  Result Value Ref Range  Status   Specimen Description LEFT ANTECUBITAL  Final   Special Requests   Final    BOTTLES DRAWN AEROBIC AND ANAEROBIC Blood Culture adequate volume   Culture   Final    NO GROWTH 2 DAYS Performed at [facility], [address] [place], [place] [address]    Report Status PENDING  Incomplete         Radiology Studies: DG Chest Port 1 View  Result Date: 08/13/2019 CLINICAL DATA:  Cough, COVID-19 positive EXAM: PORTABLE CHEST 1 VIEW COMPARISON:  05/31/2009 FINDINGS: The heart size and mediastinal contours are within normal limits. Low lung volumes. Mild streaky opacity in the left lung base. No pleural effusion or pneumothorax. The visualized skeletal structures are unremarkable. IMPRESSION: Low lung volumes with streaky opacity in the left lung base, which could reflect atelectasis or infiltrate. Electronically Signed   By: Davina Poke D.O.   On: 08/13/2019 16:10        Scheduled Meds: . acetaminophen  1,000 mg Oral BID  . vitamin C  500 mg Oral Daily  . dexamethasone (DECADRON) injection  6 mg Intravenous Q24H  . enoxaparin (LOVENOX) injection  40 mg Subcutaneous Q24H  . escitalopram  10 mg Oral Daily  . pantoprazole  40 mg Oral Daily  .  simvastatin  20 mg Oral q1800  . zinc sulfate  220 mg Oral Daily   Continuous Infusions: . sodium chloride 250 mL (08/14/19 1738)  . remdesivir 100 mg in NS 100 mL 100 mg (08/14/19 1742)     LOS: 2 days    Time spent: 30 minutes    Shanen Norris Darleen Crocker, DO [facility] Pager [phone]  If 7PM-7AM, please contact night-coverage [email or web address] Password [email or web address] 08/15/2019, 3:50 PM

## 2019-08-15 NOTE — Progress Notes (Signed)
Pt's IV pump beeping. Upon entering room, found pt sitting on side of bed with IV tubing and IV angiocath in his hand, trickling of blood noted down right arm. Pt states, "I don't know what's wrong with this thing. I wanted to go to the bathroom and this thing keeps beeping!". Pt was assisted into chair by PT less than 30 minutes ago, but moved self back to bed. Bleeding controlled from old IV site and bandage applied. Pt assisted to ambulate to bathroom for BM, urinated in bathroom floor while sitting on toilet. Pt peri-care completed post BM, assisted to ambulate back to chair. Pt tolerated well with walker. Rambling conversation at times, does not follow instructions well. Asked pt if he was hard of hearing and he denied. Attempted to restart IV x2 without success, asked SWOT nurse to attempt.

## 2019-08-16 DIAGNOSIS — R531 Weakness: Secondary | ICD-10-CM

## 2019-08-16 LAB — CBC WITH DIFFERENTIAL/PLATELET
Abs Immature Granulocytes: 0.25 10*3/uL — ABNORMAL HIGH (ref 0.00–0.07)
Basophils Absolute: 0 10*3/uL (ref 0.0–0.1)
Basophils Relative: 0 %
Eosinophils Absolute: 0 10*3/uL (ref 0.0–0.5)
Eosinophils Relative: 0 %
HCT: 36.6 % — ABNORMAL LOW (ref 39.0–52.0)
Hemoglobin: 11.8 g/dL — ABNORMAL LOW (ref 13.0–17.0)
Immature Granulocytes: 1 %
Lymphocytes Relative: 9 %
Lymphs Abs: 1.6 10*3/uL (ref 0.7–4.0)
MCH: 32.8 pg (ref 26.0–34.0)
MCHC: 32.2 g/dL (ref 30.0–36.0)
MCV: 101.7 fL — ABNORMAL HIGH (ref 80.0–100.0)
Monocytes Absolute: 0.8 10*3/uL (ref 0.1–1.0)
Monocytes Relative: 4 %
Neutro Abs: 15.3 10*3/uL — ABNORMAL HIGH (ref 1.7–7.7)
Neutrophils Relative %: 86 %
Platelets: 173 10*3/uL (ref 150–400)
RBC: 3.6 MIL/uL — ABNORMAL LOW (ref 4.22–5.81)
RDW: 12 % (ref 11.5–15.5)
WBC: 17.9 10*3/uL — ABNORMAL HIGH (ref 4.0–10.5)
nRBC: 0 % (ref 0.0–0.2)

## 2019-08-16 LAB — COMPREHENSIVE METABOLIC PANEL
ALT: 45 U/L — ABNORMAL HIGH (ref 0–44)
AST: 65 U/L — ABNORMAL HIGH (ref 15–41)
Albumin: 2.9 g/dL — ABNORMAL LOW (ref 3.5–5.0)
Alkaline Phosphatase: 55 U/L (ref 38–126)
Anion gap: 7 (ref 5–15)
BUN: 41 mg/dL — ABNORMAL HIGH (ref 8–23)
CO2: 23 mmol/L (ref 22–32)
Calcium: 8.2 mg/dL — ABNORMAL LOW (ref 8.9–10.3)
Chloride: 105 mmol/L (ref 98–111)
Creatinine, Ser: 1.39 mg/dL — ABNORMAL HIGH (ref 0.61–1.24)
GFR calc Af Amer: 54 mL/min — ABNORMAL LOW (ref >60)
GFR calc non Af Amer: 46 mL/min — ABNORMAL LOW (ref >60)
Glucose, Bld: 149 mg/dL — ABNORMAL HIGH (ref 70–99)
Potassium: 4.2 mmol/L (ref 3.5–5.1)
Sodium: 135 mmol/L (ref 135–145)
Total Bilirubin: 0.6 mg/dL (ref 0.3–1.2)
Total Protein: 6.8 g/dL (ref 6.5–8.1)

## 2019-08-16 LAB — FERRITIN: Ferritin: 483 ng/mL — ABNORMAL HIGH (ref 24–336)

## 2019-08-16 LAB — C-REACTIVE PROTEIN: CRP: 3.4 mg/dL — ABNORMAL HIGH (ref <1.0)

## 2019-08-16 LAB — D-DIMER, QUANTITATIVE: D-Dimer, Quant: 1.41 ug/mL-FEU — ABNORMAL HIGH (ref 0.00–0.50)

## 2019-08-16 LAB — MAGNESIUM: Magnesium: 2.2 mg/dL (ref 1.7–2.4)

## 2019-08-16 LAB — PHOSPHORUS: Phosphorus: 2.6 mg/dL (ref 2.5–4.6)

## 2019-08-16 NOTE — Care Management Important Message (Signed)
Important Message  Patient Details  Name: Casey Cooley MRN: [id number] Date of Birth: [birth year]   Medicare Important Message Given:  Yes(Brandi, RN will deliver letter to patient due to contact precautions)     Tommy Medal 08/16/2019, 4:29 PM

## 2019-08-16 NOTE — Progress Notes (Signed)
Physical Therapy Treatment Patient Details Name: Casey Cooley MRN: [id number] DOB: 05-14-1933 Today's Date: 08/16/2019    History of Present Illness Casey Cooley is a 86 y.o. male with a history of hyperlipidemia, hypertension, asthma/COPD.  Patient seen for increasing weakness over the past several days.  Because of his weakness, he is had a difficult time walking and ambulating from room to room.  He gets short of breath with exertion.  Because of this, the patient has had a couple of falls at home.  His wife has dementia and he is the primary caregiver.  Denies cough, but has been having a fever.  While he has not been leaving his house, he has had family and friends over and does not wear a mask with them.    PT Comments    Patient demonstrates increased endurance/distance for ambulation in room without loss of balance demonstrating slightly labored slow cadence, limited mostly due to c/o fatigue and tolerated sitting up in chair after therapy - RN notified.  Patient will benefit from continued physical therapy in hospital and recommended venue below to increase strength, balance, endurance for safe ADLs and gait.    Follow Up Recommendations  SNF     Equipment Recommendations  None recommended by PT    Recommendations for Other Services       Precautions / Restrictions Precautions Precautions: Fall Restrictions Weight Bearing Restrictions: No    Mobility  Bed Mobility Overal bed mobility: Needs Assistance Bed Mobility: Supine to Sit     Supine to sit: Min assist     General bed mobility comments: increased time, labored movement  Transfers Overall transfer level: Needs assistance Equipment used: Rolling walker (2 wheeled) Transfers: Sit to/from [employer] Sit to Stand: Min assist;Min guard Stand pivot transfers: Min assist       General transfer comment: slightly labored, increased time  Ambulation/Gait Ambulation/Gait assistance: Min  assist Gait Distance (Feet): 30 Feet Assistive device: Rolling walker (2 wheeled) Gait Pattern/deviations: Decreased step length - left;Decreased stance time - right;Decreased stride length Gait velocity: slow   General Gait Details: increased endurance/distance for ambulation in room demonstrating slow labored cadence without loss of balance, limited secondary to c/o fatigue   Stairs             Wheelchair Mobility    Modified Rankin (Stroke Patients Only)       Balance Overall balance assessment: Needs assistance Sitting-balance support: Feet supported;No upper extremity supported Sitting balance-Leahy Scale: Good Sitting balance - Comments: seated at EOB   Standing balance support: During functional activity;Bilateral upper extremity supported Standing balance-Leahy Scale: Fair Standing balance comment: using RW                            Cognition Arousal/Alertness: Awake/alert Behavior During Therapy: WFL for tasks assessed/performed;Anxious Overall Cognitive Status: Within Functional Limits for tasks assessed                                        Exercises General Exercises - Lower Extremity Long Arc Quad: Seated;AROM;Strengthening;Both;10 reps Hip Flexion/Marching: Seated;AROM;Strengthening;Both;10 reps Toe Raises: Seated;AROM;Strengthening;Both;10 reps Heel Raises: Seated;AROM;Strengthening;Both;10 reps    General Comments        Pertinent Vitals/Pain Pain Assessment: No/denies pain    Home Living  Prior Function            PT Goals (current goals can now be found in the care plan section) Acute Rehab PT Goals Patient Stated Goal: return home with family to assist after rehab PT Goal Formulation: With patient Time For Goal Achievement: 08/29/19 Potential to Achieve Goals: Good Progress towards PT goals: Progressing toward goals    Frequency    Min 3X/week      PT Plan  Current plan remains appropriate    Co-evaluation              AM-PAC PT "6 Clicks" Mobility   Outcome Measure  Help needed turning from your back to your side while in a flat bed without using bedrails?: A Little Help needed moving from lying on your back to sitting on the side of a flat bed without using bedrails?: A Little Help needed moving to and from a bed to a chair (including a wheelchair)?: A Little Help needed standing up from a chair using your arms (e.g., wheelchair or bedside chair)?: A Little Help needed to walk in hospital room?: A Lot Help needed climbing 3-5 steps with a railing? : A Lot 6 Click Score: 16    End of Session   Activity Tolerance: Patient tolerated treatment well;Patient limited by fatigue Patient left: in chair;with call bell/phone within reach Nurse Communication: Mobility status PT Visit Diagnosis: Unsteadiness on feet (R26.81);Other abnormalities of gait and mobility (R26.89);Muscle weakness (generalized) (M62.81)     Time: [id number] PT Time Calculation (min) (ACUTE ONLY): 31 min  Charges:  $Gait Training: 8-22 mins $Therapeutic Exercise: 8-22 mins                     4:18 PM, 08/16/19 Lonell Grandchild, MPT Physical Therapist with [facility] [phone] office [phone] mobile phone

## 2019-08-16 NOTE — Progress Notes (Signed)
PROGRESS NOTE  Casey Cooley [id number] DOB: 1933/02/05 DOA: 08/13/2019 PCP: Dione Housekeeper, MD  Brief History:  Per HPI: Casey Cooley a 86 y.o.malewith a history of hyperlipidemia, hypertension, asthma/COPD. Patient seen for increasing weakness over the past several days. Because of his weakness, he is had a difficult time walking and ambulating from room to room. He gets short of breath with exertion. Because of this, the patient has had a couple of falls at home. His wife has dementia and he is the primary caregiver. Denies cough, but has been having a fever. While he has not been leaving his house, he has had family and friends over and does not wear a mask with them.  1/3:Patient was admitted with acute hypoxemic respiratory failure secondary to COVID-19 pneumonia. He appears to be on 2 L this morning and is quite somnolent. CRP is elevated and blood pressures are soft. We will plan to hold losartan and increase dexamethasone to 6 mg and change to IV. Continue to follow inflammatory markers.  1/4: Patient doing well on 1L [place]. PT evaluation due to fall at home with recommendations for SNF, but patient refuses. He cares for wife with dementia at home and has no transport to get to outpatient infusions. He will need to remain hospitalized for his full COVID treatment.  1/5:  Patient doing well.  Weaned to RA this am.  He continues to refuse SNF.  Remains deconditioned.   He cares for wife with dementia at home and has no transport to get to outpatient infusions. He will need to remain hospitalized for his full COVID treatment.  Due to finish 08/17/19  Assessment/Plan: Acute hypoxemic respiratory failure secondary to COVID-19 pneumonia -Continue remdesivir day 4/5 for full treatment in hospital -Decadron 6 mg IV daily -Monitor inflammatory markers with downward trend noted, but with leukocytosis likely related to steroids -Continue vitamin C and zinc -Wean  oxygen as tolerated, now down to RA -CRP 7.1>>>3.4 -D-Dimer 2.56>>>1.41 -Ferritin 274>>>483 -PCT<0.10  Hypertension -soft blood pressure readings initially-->improving -Continue to hold home losartan and monitor carefully  Dyslipidemia -Continue statin  Depression -continue Lexapro  Asthma/COPD -inhaler prn, no active symptoms otherwise noted        Disposition Plan:   Home 08/17/19 if stable Family Communication:   Updated niece (primary caregiver) on phone 1/5  Consultants:  none  Code Status:  FULL   DVT Prophylaxis:  [place] Lovenox   Procedures: As Listed in Progress Note Above       Subjective: Patient denies fevers, chills, headache, chest pain, dyspnea, nausea, vomiting, diarrhea, abdominal pain, dysuria, hematuria, hematochezia, and melena.   Objective: Vitals:   08/15/19 0854 08/15/19 1524 08/15/19 2133 08/16/19 0544  BP:  (!) 127/56 (!) 126/56 (!) 142/71  Pulse: 66 60 62 (!) 54  Resp: 18 18 20    Temp:  98.6 F (37 C) 98.8 F (37.1 C) (!) 97.4 F (36.3 C)  TempSrc:  Oral Oral Oral  SpO2: 94% 94% 92% 92%  Weight:      Height:        Intake/Output Summary (Last 24 hours) at 08/16/2019 1054 Last data filed at 08/16/2019 0900 Gross per 24 hour  Intake 800 ml  Output --  Net 800 ml   Weight change:  Exam:   General:  Pt is alert, follows commands appropriately, not in acute distress  HEENT: No icterus, No thrush, No neck mass, [place]/AT  Cardiovascular: RRR, S1/S2,  no rubs, no gallops  Respiratory: bibasilar rales. No wheeze  Abdomen: Soft/+BS, non tender, non distended, no guarding  Extremities: No edema, No lymphangitis, No petechiae, No rashes, no synovitis   Data Reviewed: I have personally reviewed following labs and imaging studies Basic Metabolic Panel: Recent Labs  Lab 08/13/19 1533 08/14/19 0601 08/15/19 0718 08/16/19 0630  NA 135 136 135 135  K 4.1 5.1 4.1 4.2  CL 103 103 103 105  CO2 21* 24 23 23   GLUCOSE 86 103*  135* 149*  BUN 29* 34* 44* 41*  CREATININE 1.53* 1.62* 1.53* 1.39*  CALCIUM 8.4* 8.4* 8.3* 8.2*  MG  --  2.2 2.2 2.2  PHOS  --  4.5 2.6 2.6   Liver Function Tests: Recent Labs  Lab 08/13/19 1533 08/14/19 0601 08/15/19 0718 08/16/19 0630  AST 55* 61* 63* 65*  ALT 30 28 38 45*  ALKPHOS 65 57 55 55  BILITOT 0.7 0.8 0.5 0.6  PROT 8.1 7.3 7.0 6.8  ALBUMIN 3.6 3.2* 2.9* 2.9*   No results for input(s): LIPASE, AMYLASE in the last 168 hours. No results for input(s): AMMONIA in the last 168 hours. Coagulation Profile: No results for input(s): INR, PROTIME in the last 168 hours. CBC: Recent Labs  Lab 08/13/19 1533 08/14/19 0601 08/15/19 0718 08/16/19 0630  WBC 8.4 6.8 16.4* 17.9*  NEUTROABS 5.9 5.0 13.6* 15.3*  HGB 12.7* 12.0* 11.7* 11.8*  HCT 40.5 39.7 36.1* 36.6*  MCV 104.9* 105.6* 101.7* 101.7*  PLT 165 156 162 173   Cardiac Enzymes: No results for input(s): CKTOTAL, CKMB, CKMBINDEX, TROPONINI in the last 168 hours. BNP: Invalid input(s): POCBNP CBG: No results for input(s): GLUCAP in the last 168 hours. HbA1C: No results for input(s): HGBA1C in the last 72 hours. Urine analysis: No results found for: COLORURINE, APPEARANCEUR, LABSPEC, PHURINE, GLUCOSEU, HGBUR, BILIRUBINUR, [place], [place], [place], NITRITE, LEUKOCYTESUR Sepsis Labs: @LABRCNTIP (procalcitonin:4,lacticidven:4) ) Recent Results (from the past 240 hour(s))  Blood Culture (routine x 2)     Status: None (Preliminary result)   Collection Time: 08/13/19  3:38 PM   Specimen: BLOOD  Result Value Ref Range Status   Specimen Description BLOOD  Final   Special Requests NONE  Final   Culture   Final    NO GROWTH 3 DAYS Performed at [facility], [address], [place], [place] [address]    Report Status PENDING  Incomplete  Blood Culture (routine x 2)     Status: None (Preliminary result)   Collection Time: 08/13/19  5:11 PM   Specimen: Left Antecubital; Blood  Result Value Ref Range Status    Specimen Description LEFT ANTECUBITAL  Final   Special Requests   Final    BOTTLES DRAWN AEROBIC AND ANAEROBIC Blood Culture adequate volume   Culture   Final    NO GROWTH 3 DAYS Performed at [facility], [address], [place], [place] [address]    Report Status PENDING  Incomplete     Scheduled Meds: . acetaminophen  1,000 mg Oral BID  . vitamin C  500 mg Oral Daily  . dexamethasone (DECADRON) injection  6 mg Intravenous Q24H  . enoxaparin (LOVENOX) injection  40 mg Subcutaneous Q24H  . escitalopram  10 mg Oral Daily  . pantoprazole  40 mg Oral Daily  . simvastatin  20 mg Oral q1800  . zinc sulfate  220 mg Oral Daily   Continuous Infusions: . sodium chloride 250 mL (08/14/19 1738)  . remdesivir 100 mg in NS 100  mL Stopped (08/15/19 1859)    Procedures/Studies: DG Chest Port 1 View  Result Date: 08/13/2019 CLINICAL DATA:  Cough, COVID-19 positive EXAM: PORTABLE CHEST 1 VIEW COMPARISON:  05/31/2009 FINDINGS: The heart size and mediastinal contours are within normal limits. Low lung volumes. Mild streaky opacity in the left lung base. No pleural effusion or pneumothorax. The visualized skeletal structures are unremarkable. IMPRESSION: Low lung volumes with streaky opacity in the left lung base, which could reflect atelectasis or infiltrate. Electronically Signed   By: Davina Poke D.O.   On: 08/13/2019 16:10    Orson Eva, DO  [facility] Pager [phone]  If 7PM-7AM, please contact night-coverage [email or web address] Password [email or web address] 08/16/2019, 10:54 AM   LOS: 3 days

## 2019-08-16 NOTE — Clinical Social Work Note (Signed)
Patient continues to decline SNF. Niece, Joellen Jersey, advised that she is with patient's spouse who has dementia. She states that she will try to speak with patient about reconsidering SNF, because no one will be there 24/7.     TOC will follow up with family.    Kyllian Clingerman, Clydene Pugh, LCSW

## 2019-08-16 NOTE — Progress Notes (Signed)
I spoke with patient's great niece Joellen Jersey who is their primary caregiver and lives with them. She would like to speak to the MD and social worker as to what the plans are for discharge. She and his wife are going today for their COVID test as they are both experiencing symptoms as well. Joellen Jersey can be reached at [phone].

## 2019-08-17 DIAGNOSIS — E785 Hyperlipidemia, unspecified: Secondary | ICD-10-CM

## 2019-08-17 DIAGNOSIS — L89312 Pressure ulcer of right buttock, stage 2: Secondary | ICD-10-CM

## 2019-08-17 DIAGNOSIS — L8990 Pressure ulcer of unspecified site, unspecified stage: Secondary | ICD-10-CM | POA: Insufficient documentation

## 2019-08-17 LAB — CBC WITH DIFFERENTIAL/PLATELET
Abs Immature Granulocytes: 0.18 10*3/uL — ABNORMAL HIGH (ref 0.00–0.07)
Basophils Absolute: 0 10*3/uL (ref 0.0–0.1)
Basophils Relative: 0 %
Eosinophils Absolute: 0 10*3/uL (ref 0.0–0.5)
Eosinophils Relative: 0 %
HCT: 37.7 % — ABNORMAL LOW (ref 39.0–52.0)
Hemoglobin: 11.7 g/dL — ABNORMAL LOW (ref 13.0–17.0)
Immature Granulocytes: 1 %
Lymphocytes Relative: 11 %
Lymphs Abs: 1.8 10*3/uL (ref 0.7–4.0)
MCH: 32 pg (ref 26.0–34.0)
MCHC: 31 g/dL (ref 30.0–36.0)
MCV: 103 fL — ABNORMAL HIGH (ref 80.0–100.0)
Monocytes Absolute: 0.9 10*3/uL (ref 0.1–1.0)
Monocytes Relative: 6 %
Neutro Abs: 13.4 10*3/uL — ABNORMAL HIGH (ref 1.7–7.7)
Neutrophils Relative %: 82 %
Platelets: 191 10*3/uL (ref 150–400)
RBC: 3.66 MIL/uL — ABNORMAL LOW (ref 4.22–5.81)
RDW: 11.9 % (ref 11.5–15.5)
WBC: 16.2 10*3/uL — ABNORMAL HIGH (ref 4.0–10.5)
nRBC: 0 % (ref 0.0–0.2)

## 2019-08-17 LAB — MAGNESIUM: Magnesium: 2.3 mg/dL (ref 1.7–2.4)

## 2019-08-17 LAB — COMPREHENSIVE METABOLIC PANEL
ALT: 59 U/L — ABNORMAL HIGH (ref 0–44)
AST: 65 U/L — ABNORMAL HIGH (ref 15–41)
Albumin: 2.8 g/dL — ABNORMAL LOW (ref 3.5–5.0)
Alkaline Phosphatase: 50 U/L (ref 38–126)
Anion gap: 9 (ref 5–15)
BUN: 41 mg/dL — ABNORMAL HIGH (ref 8–23)
CO2: 23 mmol/L (ref 22–32)
Calcium: 8.4 mg/dL — ABNORMAL LOW (ref 8.9–10.3)
Chloride: 103 mmol/L (ref 98–111)
Creatinine, Ser: 1.32 mg/dL — ABNORMAL HIGH (ref 0.61–1.24)
GFR calc Af Amer: 57 mL/min — ABNORMAL LOW (ref >60)
GFR calc non Af Amer: 49 mL/min — ABNORMAL LOW (ref >60)
Glucose, Bld: 160 mg/dL — ABNORMAL HIGH (ref 70–99)
Potassium: 4.4 mmol/L (ref 3.5–5.1)
Sodium: 135 mmol/L (ref 135–145)
Total Bilirubin: 0.6 mg/dL (ref 0.3–1.2)
Total Protein: 6.6 g/dL (ref 6.5–8.1)

## 2019-08-17 LAB — PHOSPHORUS: Phosphorus: 2.3 mg/dL — ABNORMAL LOW (ref 2.5–4.6)

## 2019-08-17 LAB — C-REACTIVE PROTEIN: CRP: 1.9 mg/dL — ABNORMAL HIGH (ref <1.0)

## 2019-08-17 LAB — D-DIMER, QUANTITATIVE: D-Dimer, Quant: 1.42 ug/mL-FEU — ABNORMAL HIGH (ref 0.00–0.50)

## 2019-08-17 LAB — FERRITIN: Ferritin: 569 ng/mL — ABNORMAL HIGH (ref 24–336)

## 2019-08-17 NOTE — Clinical Social Work Note (Signed)
Patient has reconsidered is now considering SNF and is agreeable. Discussed choices of SNF's accepting COVID+ patients based off Medicare website. Referrals sent.    Kaitlyne Friedhoff, Clydene Pugh, LCSW

## 2019-08-17 NOTE — Progress Notes (Signed)
After more discussion and thinking it over the patient has agreed to SNF upon discharge. Patient stated he knows he needs to take care of himself in order to be able to care for his wife. I spoke with his great niece Joellen Jersey) last night and she is relieved he is willing to go. Joellen Jersey was asking questions regarding where the placement would be and told her that the case manager and social worker could answer the questions that I was not able to.

## 2019-08-17 NOTE — Plan of Care (Signed)

## 2019-08-17 NOTE — Progress Notes (Signed)
PROGRESS NOTE  Casey Cooley [id number] DOB: 06-Jun-1933 DOA: 08/13/2019 PCP: Dione Housekeeper, MD  Brief History:  Per HPI: Casey Cooley a 86 y.o.malewith a history of hyperlipidemia, hypertension, asthma/COPD. Patient seen for increasing weakness over the past several days. Because of his weakness, he is had a difficult time walking and ambulating from room to room. He gets short of breath with exertion. Because of this, the patient has had a couple of falls at home. His wife has dementia and he is the primary caregiver. Denies cough, but has been having a fever. While he has not been leaving his house, he has had family and friends over and does not wear a mask with them.  1/3:Patient was admitted with acute hypoxemic respiratory failure secondary to COVID-19 pneumonia. He appears to be on 2 L this morning and is quite somnolent. CRP is elevated and blood pressures are soft. We will plan to hold losartan and increase dexamethasone to 6 mg and change to IV. Continue to follow inflammatory markers.  1/4: Patient doing well on 1L [place]. PT evaluation due to fall at home with recommendations for SNF, but patient refuses. He cares for wife with dementia at home and has no transport to get to outpatient infusions. He will need to remain hospitalized for his full COVID treatment.  1/5:  Patient doing well.  Weaned to RA this am.  He continues to refuse SNF.  Remains deconditioned.   He cares for wife with dementia at home and has no transport to get to outpatient infusions. He will need to remain hospitalized for his full COVID treatment.   08/17/19: remdesivir infusion completed today; will continue steroids. Per PT recommendations SNF for rehab at discharge. Patient in agreement.  Assessment/Plan: Acute hypoxemic respiratory failure secondary to COVID-19 pneumonia -Continue remdesivir day 5/5 for full treatment in hospital -continue steroids Decadron 6 mg IV daily today  and transition to oral tapering tomorrow (08/18/19). -Monitor inflammatory markers with downward trend noted, but with leukocytosis likely related to steroids -Continue vitamin C and zinc -oxygen weaned to RA. -CRP 7.1>>>3.4 -D-Dimer 2.56>>>1.41 -Ferritin 274>>>483 -PCT<0.10  Hypertension -soft blood pressure readings initially-->improving -Continue to hold home losartan for now -monitor VS.  Dyslipidemia -Continue statin -follow LFT's intermittently.  Depression -continue Lexapro -no SI or hallucinations.  Asthma/COPD -inhaler prn, no active symptoms otherwise noted -good O2 sat on RA -continue steroids tapering.  Pressure injury -present on admission -right buttocks stage 2 -continue preventive measurements and constant repositioning.   Physical deconditioning -seen by PT; SNF recommended -patient in agreement -SW aware and helping with discharge.  Disposition Plan:   To SNF when bed available.  Family Communication:   Updated niece (primary caregiver) on phone 1/5. No family at bedside today.  Consultants:  none  Code Status:  FULL   DVT Prophylaxis:  [place] Lovenox   Procedures: As Listed in Progress Note Above  Subjective: Feeling weak, deconditioned and intermittently dizzy. No CP, no nausea, no vomiting, good O2 sat on RA.  Objective: Vitals:   08/16/19 1408 08/16/19 2146 08/17/19 0601 08/17/19 1344  BP: 129/68 (!) 145/66 (!) 136/98 (!) 149/77  Pulse: 67 (!) 59 (!) 54 70  Resp: 18 20 20 19   Temp: 98.2 F (36.8 C) 98.3 F (36.8 C) 98.4 F (36.9 C) (!) 96 F (35.6 C)  TempSrc:  Oral Oral Temporal  SpO2: 94% 92% 90% 97%  Weight:      Height:  Intake/Output Summary (Last 24 hours) at 08/17/2019 2205 Last data filed at 08/17/2019 1700 Gross per 24 hour  Intake 840 ml  Output 1675 ml  Net -835 ml   Weight change:   Exam: General exam: Alert, awake, oriented and following commands appropriately. Reported breathing easier but feeling  intermittently dizzy and very deconditioned. Requiring assistance while using his walker. Respiratory system: positive rhonchi, no wheezing, no crackles. Good O2 sat on RA. Cardiovascular system:RRR. No murmurs, rubs, gallops. Gastrointestinal system: Abdomen is nondistended, soft and nontender. No organomegaly or masses felt. Normal bowel sounds heard. Central nervous system: Alert and oriented. No focal neurological deficits. Extremities: No C/C/E, +pedal pulses Skin: No rashes, no petechiae; stage 2 pressure injury on right buttocks (present on admission). Psychiatry: Judgement and insight appear normal. Mood & affect appropriate.    Data Reviewed: I have personally reviewed following labs and imaging studies  Basic Metabolic Panel: Recent Labs  Lab 08/13/19 1533 08/14/19 0601 08/15/19 0718 08/16/19 0630 08/17/19 0420  NA 135 136 135 135 135  K 4.1 5.1 4.1 4.2 4.4  CL 103 103 103 105 103  CO2 21* 24 23 23 23   GLUCOSE 86 103* 135* 149* 160*  BUN 29* 34* 44* 41* 41*  CREATININE 1.53* 1.62* 1.53* 1.39* 1.32*  CALCIUM 8.4* 8.4* 8.3* 8.2* 8.4*  MG  --  2.2 2.2 2.2 2.3  PHOS  --  4.5 2.6 2.6 2.3*   Liver Function Tests: Recent Labs  Lab 08/13/19 1533 08/14/19 0601 08/15/19 0718 08/16/19 0630 08/17/19 0420  AST 55* 61* 63* 65* 65*  ALT 30 28 38 45* 59*  ALKPHOS 65 57 55 55 50  BILITOT 0.7 0.8 0.5 0.6 0.6  PROT 8.1 7.3 7.0 6.8 6.6  ALBUMIN 3.6 3.2* 2.9* 2.9* 2.8*   CBC: Recent Labs  Lab 08/13/19 1533 08/14/19 0601 08/15/19 0718 08/16/19 0630 08/17/19 0420  WBC 8.4 6.8 16.4* 17.9* 16.2*  NEUTROABS 5.9 5.0 13.6* 15.3* 13.4*  HGB 12.7* 12.0* 11.7* 11.8* 11.7*  HCT 40.5 39.7 36.1* 36.6* 37.7*  MCV 104.9* 105.6* 101.7* 101.7* 103.0*  PLT 165 156 162 173 191    Recent Results (from the past 240 hour(s))  Blood Culture (routine x 2)     Status: None (Preliminary result)   Collection Time: 08/13/19  3:38 PM   Specimen: BLOOD  Result Value Ref Range Status    Specimen Description BLOOD  Final   Special Requests NONE  Final   Culture   Final    NO GROWTH 4 DAYS Performed at [facility], [address], [place], [place] [address]    Report Status PENDING  Incomplete  Blood Culture (routine x 2)     Status: None (Preliminary result)   Collection Time: 08/13/19  5:11 PM   Specimen: Left Antecubital; Blood  Result Value Ref Range Status   Specimen Description LEFT ANTECUBITAL  Final   Special Requests   Final    BOTTLES DRAWN AEROBIC AND ANAEROBIC Blood Culture adequate volume   Culture   Final    NO GROWTH 4 DAYS Performed at [facility], [address] [place], [place] [address]    Report Status PENDING  Incomplete     Scheduled Meds: . acetaminophen  1,000 mg Oral BID  . vitamin C  500 mg Oral Daily  . dexamethasone (DECADRON) injection  6 mg Intravenous Q24H  . enoxaparin (LOVENOX) injection  40 mg Subcutaneous Q24H  . escitalopram  10 mg Oral Daily  . pantoprazole  40 mg Oral Daily  . simvastatin  20 mg Oral q1800  . zinc sulfate  220 mg Oral Daily   Continuous Infusions: . sodium chloride 250 mL (08/14/19 1738)    Procedures/Studies: DG Chest Port 1 View  Result Date: 08/13/2019 CLINICAL DATA:  Cough, COVID-19 positive EXAM: PORTABLE CHEST 1 VIEW COMPARISON:  05/31/2009 FINDINGS: The heart size and mediastinal contours are within normal limits. Low lung volumes. Mild streaky opacity in the left lung base. No pleural effusion or pneumothorax. The visualized skeletal structures are unremarkable. IMPRESSION: Low lung volumes with streaky opacity in the left lung base, which could reflect atelectasis or infiltrate. Electronically Signed   By: Davina Poke D.O.   On: 08/13/2019 16:10    Barton Dubois, MD  [facility] Pager [phone].  08/17/2019, 10:05 PM   LOS: 4 days

## 2019-08-17 NOTE — NC FL2 (Deleted)
[place] LEVEL OF CARE SCREENING TOOL     IDENTIFICATION  Patient Name: Casey Cooley Birthdate: 05/15/1933 Sex: male Admission Date (Current Location): 08/13/2019  [facility] and [place] Number:  [employer] and Address:  [facility] [address] [place]      Provider Number: [id number]  Attending Physician Name and Address:  Barton Dubois, MD  Relative Name and Phone Number:       Current Level of Care: Hospital Recommended Level of Care: [place] Prior Approval Number:    Date Approved/Denied:   PASRR Number: [id number]  Discharge Plan: SNF    Current Diagnoses: Patient Active Problem List   Diagnosis Date Noted  . Generalized weakness   . Acute respiratory disease due to COVID-19 virus 08/13/2019  . Facet degeneration of lumbar region 03/28/2018  . CHRONIC RHINITIS 10/08/2009  . Hyperlipemia 05/31/2009  . Essential hypertension 05/31/2009  . COUGH 05/31/2009  . RETINAL DETACHMENT, BILATERAL, HX OF 05/31/2009    Orientation RESPIRATION BLADDER Height & Weight     Self, Situation, Place  O2(2L) Incontinent Weight: 224 lb 13.9 oz (102 kg) Height:  6\' 1"  (185.4 cm)  BEHAVIORAL SYMPTOMS/MOOD NEUROLOGICAL BOWEL NUTRITION STATUS      Continent Diet(Heart Healthy)  AMBULATORY STATUS COMMUNICATION OF NEEDS Skin   Limited Assist Verbally Normal                       Personal Care Assistance Level of Assistance  Bathing, Feeding, Dressing Bathing Assistance: Limited assistance Feeding assistance: Independent Dressing Assistance: Limited assistance     Functional Limitations Info  Sight, Hearing, Speech Sight Info: Adequate Hearing Info: Adequate Speech Info: Adequate    SPECIAL CARE FACTORS FREQUENCY  PT (By licensed PT)     PT Frequency: 5x/wwek              Contractures Contractures Info: Not present    Additional Factors Info  Code Status, Psychotropic, Allergies  Code Status Info: Full Allergies Info: NKA Psychotropic Info: Lexapro         Current Medications (08/17/2019):  This is the current hospital active medication list Current Facility-Administered Medications  Medication Dose Route Frequency Provider Last Rate Last Admin  . 0.9 %  sodium chloride infusion   Intravenous PRN Truett Mainland, DO 10 mL/hr at 08/14/19 1738 250 mL at 08/14/19 1738  . acetaminophen (TYLENOL) tablet 1,000 mg  1,000 mg Oral BID Truett Mainland, DO   1,000 mg at 08/17/19 [id number]  . albuterol (VENTOLIN HFA) 108 (90 Base) MCG/ACT inhaler 1-2 puff  1-2 puff Inhalation Q6H PRN Truett Mainland, DO      . ascorbic acid (VITAMIN C) tablet 500 mg  500 mg Oral Daily Truett Mainland, DO   500 mg at 08/17/19 0916  . chlorpheniramine-HYDROcodone (TUSSIONEX) 10-8 MG/5ML suspension 5 mL  5 mL Oral Q12H PRN Truett Mainland, DO   5 mL at 08/16/19 2104  . dexamethasone (DECADRON) injection 6 mg  6 mg Intravenous Q24H Manuella Ghazi, Pratik D, DO   6 mg at 08/17/19 0916  . enoxaparin (LOVENOX) injection 40 mg  40 mg Subcutaneous Q24H Truett Mainland, DO   40 mg at 08/16/19 2104  . escitalopram (LEXAPRO) tablet 10 mg  10 mg Oral Daily Truett Mainland, DO   10 mg at 08/17/19 [id number]  . fluticasone (FLONASE) 50 MCG/ACT nasal spray 2 spray  2 spray Each Nare Daily  PRN Truett Mainland, DO   2 spray at 08/17/19 [id number]  . guaiFENesin-dextromethorphan (ROBITUSSIN DM) 100-10 MG/5ML syrup 10 mL  10 mL Oral Q4H PRN Truett Mainland, DO      . pantoprazole (PROTONIX) EC tablet 40 mg  40 mg Oral Daily Truett Mainland, DO   40 mg at 08/17/19 0916  . polyethylene glycol (MIRALAX / GLYCOLAX) packet 17 g  17 g Oral Daily PRN Truett Mainland, DO      . remdesivir 100 mg in sodium chloride 0.9 % 100 mL IVPB  100 mg Intravenous QPM Shah, Pratik D, DO 200 mL/hr at 08/16/19 1734 100 mg at 08/16/19 1734  . simvastatin (ZOCOR) tablet 20 mg  20 mg Oral q1800 Truett Mainland, DO   20 mg at 08/16/19 1734  . zinc sulfate  capsule 220 mg  220 mg Oral Daily Truett Mainland, DO   220 mg at 08/17/19 [id number]     Discharge Medications: Please see discharge summary for a list of discharge medications.  Relevant Imaging Results:  Relevant Lab Results:   Additional Information SSN [id number], Clydene Pugh, LCSW

## 2019-08-17 NOTE — NC FL2 (Signed)
[place] LEVEL OF CARE SCREENING TOOL     IDENTIFICATION  Patient Name: Casey Cooley Birthdate: 15-Jun-1933 Sex: male Admission Date (Current Location): 08/13/2019  [facility] and [place] Number:  [employer] and Address:  [facility] [address] [place]      Provider Number: [id number]  Attending Physician Name and Address:  Barton Dubois, MD  Relative Name and Phone Number:       Current Level of Care: Hospital Recommended Level of Care: [place] Prior Approval Number:    Date Approved/Denied:   PASRR Number: [id number]  Discharge Plan: SNF    Current Diagnoses: Patient Active Problem List   Diagnosis Date Noted  . Generalized weakness   . Acute respiratory disease due to COVID-19 virus 08/13/2019  . Facet degeneration of lumbar region 03/28/2018  . CHRONIC RHINITIS 10/08/2009  . Hyperlipemia 05/31/2009  . Essential hypertension 05/31/2009  . COUGH 05/31/2009  . RETINAL DETACHMENT, BILATERAL, HX OF 05/31/2009    Orientation RESPIRATION BLADDER Height & Weight     Self, Situation, Place  O2(2L) Incontinent Weight: 224 lb 13.9 oz (102 kg) Height:  6\' 1"  (185.4 cm)  BEHAVIORAL SYMPTOMS/MOOD NEUROLOGICAL BOWEL NUTRITION STATUS      Continent Diet(Heart Healthy)  AMBULATORY STATUS COMMUNICATION OF NEEDS Skin   Limited Assist Verbally Normal                       Personal Care Assistance Level of Assistance  Bathing, Feeding, Dressing Bathing Assistance: Limited assistance Feeding assistance: Independent Dressing Assistance: Limited assistance     Functional Limitations Info  Sight, Hearing, Speech Sight Info: Adequate Hearing Info: Adequate Speech Info: Adequate    SPECIAL CARE FACTORS FREQUENCY  PT (By licensed PT)     PT Frequency: 5x/wwek              Contractures Contractures Info: Not present    Additional Factors Info  Isolation Precautions Code Status Info:  Full Allergies Info: NKA Psychotropic Info: Lexapro   Isolation Precautions Info: 08/13/19 COVID-19+     Current Medications (08/17/2019):  This is the current hospital active medication list Current Facility-Administered Medications  Medication Dose Route Frequency Provider Last Rate Last Admin  . 0.9 %  sodium chloride infusion   Intravenous PRN Truett Mainland, DO 10 mL/hr at 08/14/19 1738 250 mL at 08/14/19 1738  . acetaminophen (TYLENOL) tablet 1,000 mg  1,000 mg Oral BID Truett Mainland, DO   1,000 mg at 08/17/19 [id number]  . albuterol (VENTOLIN HFA) 108 (90 Base) MCG/ACT inhaler 1-2 puff  1-2 puff Inhalation Q6H PRN Truett Mainland, DO      . ascorbic acid (VITAMIN C) tablet 500 mg  500 mg Oral Daily Truett Mainland, DO   500 mg at 08/17/19 0916  . chlorpheniramine-HYDROcodone (TUSSIONEX) 10-8 MG/5ML suspension 5 mL  5 mL Oral Q12H PRN Truett Mainland, DO   5 mL at 08/16/19 2104  . dexamethasone (DECADRON) injection 6 mg  6 mg Intravenous Q24H Manuella Ghazi, Pratik D, DO   6 mg at 08/17/19 0916  . enoxaparin (LOVENOX) injection 40 mg  40 mg Subcutaneous Q24H Truett Mainland, DO   40 mg at 08/16/19 2104  . escitalopram (LEXAPRO) tablet 10 mg  10 mg Oral Daily Truett Mainland, DO   10 mg at 08/17/19 [id number]  . fluticasone (FLONASE) 50 MCG/ACT nasal spray 2 spray  2 spray Each Nare  Daily PRN Truett Mainland, DO   2 spray at 08/17/19 [id number]  . guaiFENesin-dextromethorphan (ROBITUSSIN DM) 100-10 MG/5ML syrup 10 mL  10 mL Oral Q4H PRN Truett Mainland, DO      . pantoprazole (PROTONIX) EC tablet 40 mg  40 mg Oral Daily Truett Mainland, DO   40 mg at 08/17/19 0916  . polyethylene glycol (MIRALAX / GLYCOLAX) packet 17 g  17 g Oral Daily PRN Truett Mainland, DO      . remdesivir 100 mg in sodium chloride 0.9 % 100 mL IVPB  100 mg Intravenous QPM Shah, Pratik D, DO 200 mL/hr at 08/16/19 1734 100 mg at 08/16/19 1734  . simvastatin (ZOCOR) tablet 20 mg  20 mg Oral q1800 Truett Mainland, DO   20 mg at 08/16/19  1734  . zinc sulfate capsule 220 mg  220 mg Oral Daily Truett Mainland, DO   220 mg at 08/17/19 [id number]     Discharge Medications: Please see discharge summary for a list of discharge medications.  Relevant Imaging Results:  Relevant Lab Results:   Additional Information SSN [id number], Clydene Pugh, LCSW

## 2019-08-18 LAB — D-DIMER, QUANTITATIVE: D-Dimer, Quant: 1.34 ug/mL-FEU — ABNORMAL HIGH (ref 0.00–0.50)

## 2019-08-18 LAB — CBC WITH DIFFERENTIAL/PLATELET
Abs Immature Granulocytes: 0.22 10*3/uL — ABNORMAL HIGH (ref 0.00–0.07)
Basophils Absolute: 0 10*3/uL (ref 0.0–0.1)
Basophils Relative: 0 %
Eosinophils Absolute: 0 10*3/uL (ref 0.0–0.5)
Eosinophils Relative: 0 %
HCT: 37.5 % — ABNORMAL LOW (ref 39.0–52.0)
Hemoglobin: 12.2 g/dL — ABNORMAL LOW (ref 13.0–17.0)
Immature Granulocytes: 2 %
Lymphocytes Relative: 14 %
Lymphs Abs: 2.1 10*3/uL (ref 0.7–4.0)
MCH: 32.6 pg (ref 26.0–34.0)
MCHC: 32.5 g/dL (ref 30.0–36.0)
MCV: 100.3 fL — ABNORMAL HIGH (ref 80.0–100.0)
Monocytes Absolute: 0.9 10*3/uL (ref 0.1–1.0)
Monocytes Relative: 6 %
Neutro Abs: 11.4 10*3/uL — ABNORMAL HIGH (ref 1.7–7.7)
Neutrophils Relative %: 78 %
Platelets: 204 10*3/uL (ref 150–400)
RBC: 3.74 MIL/uL — ABNORMAL LOW (ref 4.22–5.81)
RDW: 11.9 % (ref 11.5–15.5)
WBC: 14.7 10*3/uL — ABNORMAL HIGH (ref 4.0–10.5)
nRBC: 0 % (ref 0.0–0.2)

## 2019-08-18 LAB — COMPREHENSIVE METABOLIC PANEL
ALT: 61 U/L — ABNORMAL HIGH (ref 0–44)
AST: 52 U/L — ABNORMAL HIGH (ref 15–41)
Albumin: 2.9 g/dL — ABNORMAL LOW (ref 3.5–5.0)
Alkaline Phosphatase: 56 U/L (ref 38–126)
Anion gap: 5 (ref 5–15)
BUN: 35 mg/dL — ABNORMAL HIGH (ref 8–23)
CO2: 26 mmol/L (ref 22–32)
Calcium: 8.4 mg/dL — ABNORMAL LOW (ref 8.9–10.3)
Chloride: 105 mmol/L (ref 98–111)
Creatinine, Ser: 1.22 mg/dL (ref 0.61–1.24)
GFR calc Af Amer: >60 mL/min (ref >60)
GFR calc non Af Amer: 54 mL/min — ABNORMAL LOW (ref >60)
Glucose, Bld: 131 mg/dL — ABNORMAL HIGH (ref 70–99)
Potassium: 4.4 mmol/L (ref 3.5–5.1)
Sodium: 136 mmol/L (ref 135–145)
Total Bilirubin: 0.4 mg/dL (ref 0.3–1.2)
Total Protein: 6.6 g/dL (ref 6.5–8.1)

## 2019-08-18 LAB — CULTURE, BLOOD (ROUTINE X 2)
Culture: NO GROWTH
Culture: NO GROWTH
Special Requests: ADEQUATE

## 2019-08-18 LAB — FERRITIN: Ferritin: 516 ng/mL — ABNORMAL HIGH (ref 24–336)

## 2019-08-18 LAB — PHOSPHORUS: Phosphorus: 1.9 mg/dL — ABNORMAL LOW (ref 2.5–4.6)

## 2019-08-18 LAB — C-REACTIVE PROTEIN: CRP: 1 mg/dL — ABNORMAL HIGH (ref <1.0)

## 2019-08-18 LAB — MAGNESIUM: Magnesium: 2.2 mg/dL (ref 1.7–2.4)

## 2019-08-18 MED ORDER — DEXAMETHASONE 4 MG PO TABS
6.0000 mg | ORAL_TABLET | Freq: Every day | ORAL | Status: DC
  Administered 2019-08-19 – 2019-08-22 (×4): 6 mg via ORAL
  Filled 2019-08-18 (×4): qty 2

## 2019-08-18 NOTE — Progress Notes (Signed)
PROGRESS NOTE  Casey Cooley [id number] DOB: 05-Mar-1933 DOA: 08/13/2019 PCP: Dione Housekeeper, MD  Brief History:  Per HPI: Casey Cooley a 86 y.o.malewith a history of hyperlipidemia, hypertension, asthma/COPD. Patient seen for increasing weakness over the past several days. Because of his weakness, he is had a difficult time walking and ambulating from room to room. He gets short of breath with exertion. Because of this, the patient has had a couple of falls at home. His wife has dementia and he is the primary caregiver. Denies cough, but has been having a fever. While he has not been leaving his house, he has had family and friends over and does not wear a mask with them.  1/3:Patient was admitted with acute hypoxemic respiratory failure secondary to COVID-19 pneumonia. He appears to be on 2 L this morning and is quite somnolent. CRP is elevated and blood pressures are soft. We will plan to hold losartan and increase dexamethasone to 6 mg and change to IV. Continue to follow inflammatory markers.  1/4: Patient doing well on 1L [place]. PT evaluation due to fall at home with recommendations for SNF, but patient refuses. He cares for wife with dementia at home and has no transport to get to outpatient infusions. He will need to remain hospitalized for his full COVID treatment.  1/5:  Patient doing well.  Weaned to RA this am.  He continues to refuse SNF.  Remains deconditioned.   He cares for wife with dementia at home and has no transport to get to outpatient infusions. He will need to remain hospitalized for his full COVID treatment.   08/17/19: remdesivir infusion completed today; will continue steroids. Per PT recommendations SNF for rehab at discharge. Patient in agreement.  Assessment/Plan: Acute hypoxemic respiratory failure secondary to COVID-19 pneumonia -Patient has completed remdesivir  infusion on 08/17/2019  -continue steroids Decadron 6 mg daily. -Monitor  inflammatory markers with downward trend noted, but with leukocytosis likely related to steroids at this point. -Continue vitamin C and zinc -oxygen weaned to RA. -CRP 7.1>>>3.4 -D-Dimer 2.56>>>1.41 -Ferritin 274>>>483 -PCT<0.10  Hypertension -soft blood pressure readings initially-->improving -Continue to hold home losartan for now -monitor VS.  Dyslipidemia -Continue statin -follow LFT's intermittently.  Depression -continue Lexapro -no SI or hallucinations.  Asthma/COPD -inhaler prn, no active symptoms otherwise noted -good O2 sat on RA -continue steroids tapering.  Pressure injury -present on admission -right buttocks stage 2 -continue preventive measurements and constant repositioning.   Physical deconditioning -seen by PT; SNF recommended -patient in agreement -SW aware and helping with discharge.  Disposition Plan:   To SNF when bed available.  Family Communication:   Updated niece (primary caregiver) on phone 1/5. No family at bedside today.  Consultants:  none  Code Status:  FULL   DVT Prophylaxis:  [place] Lovenox   Procedures: As Listed in Progress Note Above  Subjective: Denies dizziness; no nausea, no vomiting, no chest pain, no shortness of breath.  Remains weak and having trouble to ambulate safely with his walker secondary to acute deconditioning.  Reports improvement in appetite.  Objective: Vitals:   08/17/19 1344 08/17/19 2208 08/18/19 0606 08/18/19 1410  BP: (!) 149/77 (!) 147/73 (!) 145/65 (!) 145/71  Pulse: 70 60 68 61  Resp: 19 20 20 20   Temp: (!) 96 F (35.6 C) 98.3 F (36.8 C) (!) 97.5 F (36.4 C) 98 F (36.7 C)  TempSrc: Temporal Oral Oral Oral  SpO2: 97%  94% 95% 93%  Weight:      Height:        Intake/Output Summary (Last 24 hours) at 08/18/2019 1703 Last data filed at 08/18/2019 0802 Gross per 24 hour  Intake --  Output 200 ml  Net -200 ml   Weight change:   Exam: General exam: Alert, awake, oriented x 3; in no  acute distress.  Still feeling weak and deconditioned.  No requiring oxygen supplementation.  Patient is afebrile. Respiratory system: Clear to auscultation. Respiratory effort normal. Cardiovascular system:RRR. No murmurs, rubs, gallops. Gastrointestinal system: Abdomen is nondistended, soft and nontender. No organomegaly or masses felt. Normal bowel sounds heard. Central nervous system: Alert and oriented. No focal neurological deficits. Extremities: No C/C/E, +pedal pulses Skin: No rashes, no petechiae; stage II pressure injury on his right buttocks (present on admission).   Psychiatry: Judgement and insight appear normal. Mood & affect appropriate.     Data Reviewed: I have personally reviewed following labs and imaging studies  Basic Metabolic Panel: Recent Labs  Lab 08/14/19 0601 08/15/19 0718 08/16/19 0630 08/17/19 0420 08/18/19 0837  NA 136 135 135 135 136  K 5.1 4.1 4.2 4.4 4.4  CL 103 103 105 103 105  CO2 24 23 23 23 26   GLUCOSE 103* 135* 149* 160* 131*  BUN 34* 44* 41* 41* 35*  CREATININE 1.62* 1.53* 1.39* 1.32* 1.22  CALCIUM 8.4* 8.3* 8.2* 8.4* 8.4*  MG 2.2 2.2 2.2 2.3 2.2  PHOS 4.5 2.6 2.6 2.3* 1.9*   Liver Function Tests: Recent Labs  Lab 08/14/19 0601 08/15/19 0718 08/16/19 0630 08/17/19 0420 08/18/19 0837  AST 61* 63* 65* 65* 52*  ALT 28 38 45* 59* 61*  ALKPHOS 57 55 55 50 56  BILITOT 0.8 0.5 0.6 0.6 0.4  PROT 7.3 7.0 6.8 6.6 6.6  ALBUMIN 3.2* 2.9* 2.9* 2.8* 2.9*   CBC: Recent Labs  Lab 08/14/19 0601 08/15/19 0718 08/16/19 0630 08/17/19 0420 08/18/19 0837  WBC 6.8 16.4* 17.9* 16.2* 14.7*  NEUTROABS 5.0 13.6* 15.3* 13.4* 11.4*  HGB 12.0* 11.7* 11.8* 11.7* 12.2*  HCT 39.7 36.1* 36.6* 37.7* 37.5*  MCV 105.6* 101.7* 101.7* 103.0* 100.3*  PLT 156 162 173 191 204    Recent Results (from the past 240 hour(s))  Blood Culture (routine x 2)     Status: None   Collection Time: 08/13/19  3:38 PM   Specimen: BLOOD  Result Value Ref Range Status     Specimen Description BLOOD  Final   Special Requests NONE  Final   Culture   Final    NO GROWTH 5 DAYS Performed at [facility], [address] [place], [place] [address]    Report Status 08/18/2019 FINAL  Final  Blood Culture (routine x 2)     Status: None   Collection Time: 08/13/19  5:11 PM   Specimen: Left Antecubital; Blood  Result Value Ref Range Status   Specimen Description LEFT ANTECUBITAL  Final   Special Requests   Final    BOTTLES DRAWN AEROBIC AND ANAEROBIC Blood Culture adequate volume   Culture   Final    NO GROWTH 5 DAYS Performed at [facility], [address] [place],  [address]    Report Status 08/18/2019 FINAL  Final     Scheduled Meds: . acetaminophen  1,000 mg Oral BID  . vitamin C  500 mg Oral Daily  . dexamethasone (DECADRON) injection  6 mg Intravenous Q24H  . enoxaparin (LOVENOX) injection  40 mg Subcutaneous  Q24H  . escitalopram  10 mg Oral Daily  . pantoprazole  40 mg Oral Daily  . simvastatin  20 mg Oral q1800  . zinc sulfate  220 mg Oral Daily   Continuous Infusions: . sodium chloride 250 mL (08/14/19 1738)    Procedures/Studies: DG Chest Port 1 View  Result Date: 08/13/2019 CLINICAL DATA:  Cough, COVID-19 positive EXAM: PORTABLE CHEST 1 VIEW COMPARISON:  05/31/2009 FINDINGS: The heart size and mediastinal contours are within normal limits. Low lung volumes. Mild streaky opacity in the left lung base. No pleural effusion or pneumothorax. The visualized skeletal structures are unremarkable. IMPRESSION: Low lung volumes with streaky opacity in the left lung base, which could reflect atelectasis or infiltrate. Electronically Signed   By: Davina Poke D.O.   On: 08/13/2019 16:10    Barton Dubois, MD  [facility] Pager [phone].  08/18/2019, 5:03 PM   LOS: 5 days

## 2019-08-18 NOTE — TOC Progression Note (Signed)
Transition of Care [facility]) - Progression Note    Patient Details  Name: Casey Cooley MRN: [id number] Date of Birth: 03-26-1933  [facility]) CM/SW Contact  Ihor Gully, LCSW Phone Number: 08/18/2019, 4:54 PM  Clinical Narrative:    Message left for [facility] with [facility] regarding bed offer.         Expected Discharge Plan and Services                                                 Social Determinants of Health (SDOH) Interventions    Readmission Risk Interventions No flowsheet data found.

## 2019-08-19 NOTE — Progress Notes (Signed)
PROGRESS NOTE  Casey Cooley [id number] DOB: 03-Aug-1933 DOA: 08/13/2019 PCP: Dione Housekeeper, MD  Brief History:  Per HPI: [name] a 86 y.o.malewith a history of hyperlipidemia, hypertension, asthma/COPD. Patient seen for increasing weakness over the past several days. Because of his weakness, he is had a difficult time walking and ambulating from room to room. He gets short of breath with exertion. Because of this, the patient has had a couple of falls at home. His wife has dementia and he is the primary caregiver. Denies cough, but has been having a fever. While he has not been leaving his house, he has had family and friends over and does not wear a mask with them.  1/3:Patient was admitted with acute hypoxemic respiratory failure secondary to COVID-19 pneumonia. He appears to be on 2 L this morning and is quite somnolent. CRP is elevated and blood pressures are soft. We will plan to hold losartan and increase dexamethasone to 6 mg and change to IV. Continue to follow inflammatory markers.  1/4: Patient doing well on 1L [place]. PT evaluation due to fall at home with recommendations for SNF, but patient refuses. He cares for wife with dementia at home and has no transport to get to outpatient infusions. He will need to remain hospitalized for his full COVID treatment.  1/5:  Patient doing well.  Weaned to RA this am.  He continues to refuse SNF.  Remains deconditioned.   He cares for wife with dementia at home and has no transport to get to outpatient infusions. He will need to remain hospitalized for his full COVID treatment.   08/17/19: remdesivir infusion completed today; will continue steroids. Per PT recommendations SNF for rehab at discharge. Patient in agreement.  1-7 and 1-03/2020: Patient has completed IV remdesivir, not requiring oxygen supplementation and otherwise medically stable to go to a skilled nursing facility for further care and rehabilitation.   At this moment waiting on bed availability for discharge.  Completing steroids therapy orally.  Assessment/Plan: Acute hypoxemic respiratory failure secondary to COVID-19 pneumonia -Patient has completed remdesivir  infusion on 08/17/2019  -continue steroids Decadron 6 mg daily. -Monitor inflammatory markers with downward trend noted, but with leukocytosis likely related to steroids at this point. -Continue vitamin C and zinc -oxygen weaned to RA. -CRP 7.1>>>3.4 -D-Dimer 2.56>>>1.41 -Ferritin 274>>>483 -PCT<0.10  Hypertension -soft blood pressure readings initially-->improving -Continue to hold home losartan for now -monitor VS.  Dyslipidemia -Continue statin -follow LFT's intermittently.  Depression -continue Lexapro -no SI or hallucinations.  Asthma/COPD -inhaler prn, no active symptoms otherwise noted -good O2 sat on RA -continue steroids tapering.  Pressure injury -present on admission -right buttocks stage 2 -continue preventive measurements and constant repositioning.   Physical deconditioning -seen by PT; SNF recommended -patient in agreement -SW aware and helping with discharge.  Disposition Plan:   To SNF when bed available.  Family Communication:   Updated niece (primary caregiver) on phone 1/5. No family at bedside today.  Consultants:  none  Code Status:  FULL   DVT Prophylaxis:   Lovenox   Procedures: As Listed in Progress Note Above  Subjective: In no acute distress.  Denies chest pain, nausea, vomiting, shortness of breath or fever.  Still weak and with poor balance.  And expressed to feel deconditioned.  Objective: Vitals:   08/18/19 1410 08/18/19 2213 08/19/19 0613 08/19/19 1332  BP: (!) 145/71 (!) 154/72 (!) 154/71 (!) 141/69  Pulse: 61 (!)  58 63 66  Resp: 20 20 18 18   Temp: 98 F (36.7 C) 97.8 F (36.6 C) 98.5 F (36.9 C) (!) 97.5 F (36.4 C)  TempSrc: Oral Oral Oral Oral  SpO2: 93% 93% 92% 92%  Weight:      Height:          Intake/Output Summary (Last 24 hours) at 08/19/2019 1641 Last data filed at 08/19/2019 0859 Gross per 24 hour  Intake 680 ml  Output 1500 ml  Net -820 ml   Weight change:   Exam: General exam: Alert, awake, oriented x 3; afebrile, in no acute distress.  Patient denies chest pain, no nausea, no vomiting.  Not requiring oxygen supplementation unable to speak in full sentences.  Patient is afebrile.  Still feeling weak, deconditioned and with poor balance. Respiratory system: Clear to auscultation. Respiratory effort normal. Cardiovascular system:RRR. No murmurs, rubs, gallops. Gastrointestinal system: Abdomen is nondistended, soft and nontender. No organomegaly or masses felt. Normal bowel sounds heard. Central nervous system: Alert and oriented. No focal neurological deficits. Extremities: No C/C/E, +pedal pulses Skin: No rashes, no petechiae; a stage II pressure injury in his right buttocks that was present at time of admission and without signs of superimposed infection. Psychiatry: Judgement and insight appear normal. Mood & affect appropriate.   Data Reviewed: I have personally reviewed following labs and imaging studies  Basic Metabolic Panel: Recent Labs  Lab 08/14/19 0601 08/15/19 0718 08/16/19 0630 08/17/19 0420 08/18/19 0837  NA 136 135 135 135 136  K 5.1 4.1 4.2 4.4 4.4  CL 103 103 105 103 105  CO2 24 23 23 23 26   GLUCOSE 103* 135* 149* 160* 131*  BUN 34* 44* 41* 41* 35*  CREATININE 1.62* 1.53* 1.39* 1.32* 1.22  CALCIUM 8.4* 8.3* 8.2* 8.4* 8.4*  MG 2.2 2.2 2.2 2.3 2.2  PHOS 4.5 2.6 2.6 2.3* 1.9*   Liver Function Tests: Recent Labs  Lab 08/14/19 0601 08/15/19 0718 08/16/19 0630 08/17/19 0420 08/18/19 0837  AST 61* 63* 65* 65* 52*  ALT 28 38 45* 59* 61*  ALKPHOS 57 55 55 50 56  BILITOT 0.8 0.5 0.6 0.6 0.4  PROT 7.3 7.0 6.8 6.6 6.6  ALBUMIN 3.2* 2.9* 2.9* 2.8* 2.9*   CBC: Recent Labs  Lab 08/14/19 0601 08/15/19 0718 08/16/19 0630 08/17/19 0420  08/18/19 0837  WBC 6.8 16.4* 17.9* 16.2* 14.7*  NEUTROABS 5.0 13.6* 15.3* 13.4* 11.4*  HGB 12.0* 11.7* 11.8* 11.7* 12.2*  HCT 39.7 36.1* 36.6* 37.7* 37.5*  MCV 105.6* 101.7* 101.7* 103.0* 100.3*  PLT 156 162 173 191 204    Recent Results (from the past 240 hour(s))  Blood Culture (routine x 2)     Status: None   Collection Time: 08/13/19  3:38 PM   Specimen: BLOOD  Result Value Ref Range Status   Specimen Description BLOOD  Final   Special Requests NONE  Final   Culture   Final    NO GROWTH 5 DAYS Performed at [facility], [address], [place], [place] [address]    Report Status 08/18/2019 FINAL  Final  Blood Culture (routine x 2)     Status: None   Collection Time: 08/13/19  5:11 PM   Specimen: Left Antecubital; Blood  Result Value Ref Range Status   Specimen Description LEFT ANTECUBITAL  Final   Special Requests   Final    BOTTLES DRAWN AEROBIC AND ANAEROBIC Blood Culture adequate volume   Culture   Final    NO  GROWTH 5 DAYS Performed at [facility], [address] [place], [place] [address]    Report Status 08/18/2019 FINAL  Final     Scheduled Meds: . acetaminophen  1,000 mg Oral BID  . vitamin C  500 mg Oral Daily  . dexamethasone  6 mg Oral Daily  . enoxaparin (LOVENOX) injection  40 mg Subcutaneous Q24H  . escitalopram  10 mg Oral Daily  . pantoprazole  40 mg Oral Daily  . simvastatin  20 mg Oral q1800  . zinc sulfate  220 mg Oral Daily   Continuous Infusions: . sodium chloride 250 mL (08/14/19 1738)    Procedures/Studies: DG Chest Port 1 View  Result Date: 08/13/2019 CLINICAL DATA:  Cough, COVID-19 positive EXAM: PORTABLE CHEST 1 VIEW COMPARISON:  05/31/2009 FINDINGS: The heart size and mediastinal contours are within normal limits. Low lung volumes. Mild streaky opacity in the left lung base. No pleural effusion or pneumothorax. The visualized skeletal structures are unremarkable. IMPRESSION: Low lung volumes with streaky opacity in the left lung base,  which could reflect atelectasis or infiltrate. Electronically Signed   By: Davina Poke D.O.   On: 08/13/2019 16:10    Barton Dubois, MD  [facility] Pager [phone].  08/19/2019, 4:41 PM   LOS: 6 days

## 2019-08-19 NOTE — TOC Initial Note (Signed)
Transition of Care [facility]) - Initial/Assessment Note    Patient Details  Name: Casey Cooley MRN: [id number] Date of Birth: 05/14/1933  Transition [facility] Care [facility]) CM/SW Contact:    Boneta Lucks, RN Phone Number: 08/19/2019, 11:45 AM  Clinical Narrative:    PT recommended SNF, sent out, [facility] and [facility] made bed offers, but no beds available until Monday. Patient wanting the bed at [facility]. CM left Keri a message, updated MD, RN and ask to update wife when she visits, she could not be reached by phone.                Expected Discharge Plan: [place]     Patient Goals and CMS Choice Patient states their goals for this hospitalization and ongoing recovery are:: to go to SNF CMS Medicare.gov Compare Post Acute Care list provided to:: Patient Choice offered to / list presented to : Patient  Expected Discharge Plan and Services Expected Discharge Plan: [place]         Prior Living Arrangements/Services   Lives with:: Spouse          Need for Family Participation in Patient Care: Yes (Comment) Care giver support system in place?: Yes (comment)   Criminal Activity/Legal Involvement Pertinent to Current Situation/Hospitalization: No - Comment as needed  Activities of Daily Living Home Assistive Devices/Equipment: Walker (specify type), Eyeglasses ADL Screening (condition at time of admission) Patient's cognitive ability adequate to safely complete daily activities?: Yes Is the patient deaf or have difficulty hearing?: No Does the patient have difficulty seeing, even when wearing glasses/contacts?: No Does the patient have difficulty concentrating, remembering, or making decisions?: No Patient able to express need for assistance with ADLs?: Yes Does the patient have difficulty dressing or bathing?: No Independently performs ADLs?: Yes (appropriate for developmental age) Does the patient have difficulty walking or climbing stairs?:  Yes Weakness of Legs: Both Weakness of Arms/Hands: None  Permission Sought/Granted     Emotional Assessment     Affect (typically observed): Accepting Orientation: : Oriented to Self, Oriented to Place Alcohol / Substance Use: Not Applicable Psych Involvement: No (comment)  Admission diagnosis:  Prolonged QT interval [R94.31] Generalized weakness [R53.1] Acute respiratory disease due to COVID-19 virus [U07.1, J06.9] COVID-19 [U07.1] Patient Active Problem List   Diagnosis Date Noted  . Pressure injury of skin 08/17/2019  . Generalized weakness   . Acute respiratory disease due to COVID-19 virus 08/13/2019  . Facet degeneration of lumbar region 03/28/2018  . CHRONIC RHINITIS 10/08/2009  . Hyperlipemia 05/31/2009  . Essential hypertension 05/31/2009  . COUGH 05/31/2009  . RETINAL DETACHMENT, BILATERAL, HX OF 05/31/2009   PCP:  Dione Housekeeper, MD Pharmacy:   [facility], [place] [address] Phone: [phone] Fax: [phone]

## 2019-08-19 NOTE — Care Management Important Message (Signed)
Important Message  Patient Details  Name: Casey Cooley MRN: [id number] Date of Birth: Nov 13, 1932   Medicare Important Message Given:  Yes(Shannon, RN will deliver letter to patient due to contact precautions)     Tommy Medal 08/19/2019, 3:19 PM

## 2019-08-19 NOTE — Plan of Care (Signed)

## 2019-08-20 LAB — CREATININE, SERUM
Creatinine, Ser: 1.26 mg/dL — ABNORMAL HIGH (ref 0.61–1.24)
GFR calc Af Amer: >60 mL/min (ref >60)
GFR calc non Af Amer: 52 mL/min — ABNORMAL LOW (ref >60)

## 2019-08-20 NOTE — Progress Notes (Signed)
PROGRESS NOTE  Casey Cooley [id number] DOB: Apr 19, 1933 DOA: 08/13/2019 PCP: Dione Housekeeper, MD  Brief History:  Per HPI: [name] a 86 y.o.malewith a history of hyperlipidemia, hypertension, asthma/COPD. Patient seen for increasing weakness over the past several days. Because of his weakness, he is had a difficult time walking and ambulating from room to room. He gets short of breath with exertion. Because of this, the patient has had a couple of falls at home. His wife has dementia and he is the primary caregiver. Denies cough, but has been having a fever. While he has not been leaving his house, he has had family and friends over and does not wear a mask with them.  1/3:Patient was admitted with acute hypoxemic respiratory failure secondary to COVID-19 pneumonia. He appears to be on 2 L this morning and is quite somnolent. CRP is elevated and blood pressures are soft. We will plan to hold losartan and increase dexamethasone to 6 mg and change to IV. Continue to follow inflammatory markers.  1/4: Patient doing well on 1L [place]. PT evaluation due to fall at home with recommendations for SNF, but patient refuses. He cares for wife with dementia at home and has no transport to get to outpatient infusions. He will need to remain hospitalized for his full COVID treatment.  1/5:  Patient doing well.  Weaned to RA this am.  He continues to refuse SNF.  Remains deconditioned.   He cares for wife with dementia at home and has no transport to get to outpatient infusions. He will need to remain hospitalized for his full COVID treatment.   08/17/19: remdesivir infusion completed today; will continue steroids. Per PT recommendations SNF for rehab at discharge. Patient in agreement.  1-7 and 1-03/2020: Patient has completed IV remdesivir, not requiring oxygen supplementation and otherwise medically stable to go to a skilled nursing facility for further care and rehabilitation.   At this moment waiting on bed availability for discharge.  Completing steroids therapy orally.  08-20-19: afebrile, stable and waiting for bed availability at SNF for further rehab/care.  Assessment/Plan: Acute hypoxemic respiratory failure secondary to COVID-19 pneumonia -Patient has completed remdesivir  infusion on 08/17/2019  -continue steroids Decadron 6 mg daily. -Monitor inflammatory markers with downward trend noted, but with leukocytosis likely related to steroids at this point. -Continue vitamin C and zinc -oxygen weaned to RA. -CRP 7.1>>>3.4 -D-Dimer 2.56>>>1.41 -Ferritin 274>>>483 -PCT<0.10  Hypertension -soft blood pressure readings initially-->improving -Continue to hold home losartan for now -monitor VS.  Dyslipidemia -Continue statin -follow LFT's intermittently.  Depression -continue Lexapro -no SI or hallucinations.  Asthma/COPD -inhaler prn, no active symptoms otherwise noted -good O2 sat on RA -continue steroids tapering.  Pressure injury -present on admission -right buttocks stage 2 -continue preventive measurements and constant repositioning.   Physical deconditioning -seen by PT; SNF recommended -patient in agreement -SW aware and helping with discharge.  Disposition Plan:   To SNF when bed available. Supposedly bed will be available on 08/22/19  Family Communication:   Updated niece (primary caregiver) on phone 1/5. No family at bedside today.  Consultants:  none  Code Status:  FULL   DVT Prophylaxis:  [place] Lovenox   Procedures: As Listed in Progress Note Above  Subjective: No fever, no CP, no SOB. Feeling weak, tired and off balance when standing. In agreement to go to SNF for rehab.  Objective: Vitals:   08/19/19 1332 08/19/19 2052 08/19/19 2157  08/20/19 0555  BP: (!) 141/69  (!) 151/70 (!) 155/78  Pulse: 66  65 (!) 58  Resp: 18  18 18   Temp: (!) 97.5 F (36.4 C)  97.6 F (36.4 C) 98 F (36.7 C)  TempSrc: Oral  Oral Oral    SpO2: 92% 92% 93% 92%  Weight:      Height:        Intake/Output Summary (Last 24 hours) at 08/20/2019 1717 Last data filed at 08/20/2019 0622 Gross per 24 hour  Intake 240 ml  Output 2000 ml  Net -1760 ml   Weight change:   Exam: General exam: Alert, awake, oriented x 3; no fever, no CP, no requiring oxygen supplementation. Reports feeling weak and tired. Off balance when trying to stand.  Respiratory system: Clear to auscultation. Respiratory effort normal. Cardiovascular system:RRR. No murmurs, rubs, gallops. Gastrointestinal system: Abdomen is nondistended, soft and nontender. No organomegaly or masses felt. Normal bowel sounds heard. Central nervous system: Alert and oriented. No focal neurological deficits. Extremities: No C/C/E, +pedal pulses Skin: No rashes, no petechiae; unchanged stage 2 right buttocks pressure injury (POA) Psychiatry: Judgement and insight appear normal. Mood & affect appropriate.     Data Reviewed: I have personally reviewed following labs and imaging studies  Basic Metabolic Panel: Recent Labs  Lab 08/14/19 0601 08/15/19 0718 08/16/19 0630 08/17/19 0420 08/18/19 0837 08/20/19 0549  NA 136 135 135 135 136  --   K 5.1 4.1 4.2 4.4 4.4  --   CL 103 103 105 103 105  --   CO2 24 23 23 23 26   --   GLUCOSE 103* 135* 149* 160* 131*  --   BUN 34* 44* 41* 41* 35*  --   CREATININE 1.62* 1.53* 1.39* 1.32* 1.22 1.26*  CALCIUM 8.4* 8.3* 8.2* 8.4* 8.4*  --   MG 2.2 2.2 2.2 2.3 2.2  --   PHOS 4.5 2.6 2.6 2.3* 1.9*  --    Liver Function Tests: Recent Labs  Lab 08/14/19 0601 08/15/19 0718 08/16/19 0630 08/17/19 0420 08/18/19 0837  AST 61* 63* 65* 65* 52*  ALT 28 38 45* 59* 61*  ALKPHOS 57 55 55 50 56  BILITOT 0.8 0.5 0.6 0.6 0.4  PROT 7.3 7.0 6.8 6.6 6.6  ALBUMIN 3.2* 2.9* 2.9* 2.8* 2.9*   CBC: Recent Labs  Lab 08/14/19 0601 08/15/19 0718 08/16/19 0630 08/17/19 0420 08/18/19 0837  WBC 6.8 16.4* 17.9* 16.2* 14.7*  NEUTROABS 5.0 13.6*  15.3* 13.4* 11.4*  HGB 12.0* 11.7* 11.8* 11.7* 12.2*  HCT 39.7 36.1* 36.6* 37.7* 37.5*  MCV 105.6* 101.7* 101.7* 103.0* 100.3*  PLT 156 162 173 191 204    Recent Results (from the past 240 hour(s))  Blood Culture (routine x 2)     Status: None   Collection Time: 08/13/19  3:38 PM   Specimen: BLOOD  Result Value Ref Range Status   Specimen Description BLOOD  Final   Special Requests NONE  Final   Culture   Final    NO GROWTH 5 DAYS Performed at [facility], [address] [place], [place] [address]    Report Status 08/18/2019 FINAL  Final  Blood Culture (routine x 2)     Status: None   Collection Time: 08/13/19  5:11 PM   Specimen: Left Antecubital; Blood  Result Value Ref Range Status   Specimen Description LEFT ANTECUBITAL  Final   Special Requests   Final    BOTTLES DRAWN AEROBIC AND ANAEROBIC Blood Culture  adequate volume   Culture   Final    NO GROWTH 5 DAYS Performed at [facility], [address] [place], [place] [address]    Report Status 08/18/2019 FINAL  Final     Scheduled Meds: . acetaminophen  1,000 mg Oral BID  . vitamin C  500 mg Oral Daily  . dexamethasone  6 mg Oral Daily  . enoxaparin (LOVENOX) injection  40 mg Subcutaneous Q24H  . escitalopram  10 mg Oral Daily  . pantoprazole  40 mg Oral Daily  . simvastatin  20 mg Oral q1800  . zinc sulfate  220 mg Oral Daily   Continuous Infusions: . sodium chloride 250 mL (08/14/19 1738)    Procedures/Studies: DG Chest Port 1 View  Result Date: 08/13/2019 CLINICAL DATA:  Cough, COVID-19 positive EXAM: PORTABLE CHEST 1 VIEW COMPARISON:  05/31/2009 FINDINGS: The heart size and mediastinal contours are within normal limits. Low lung volumes. Mild streaky opacity in the left lung base. No pleural effusion or pneumothorax. The visualized skeletal structures are unremarkable. IMPRESSION: Low lung volumes with streaky opacity in the left lung base, which could reflect atelectasis or infiltrate. Electronically Signed    By: Davina Poke D.O.   On: 08/13/2019 16:10    Barton Dubois, MD  [facility] Pager [phone].  08/20/2019, 5:17 PM   LOS: 7 days

## 2019-08-21 NOTE — Progress Notes (Signed)
PROGRESS NOTE  Casey Cooley [id number] DOB: 08-25-1932 DOA: 08/13/2019 PCP: Casey Housekeeper, MD  Brief History:  Per HPI: Casey Cooley a 86 y.o.malewith a history of hyperlipidemia, hypertension, asthma/COPD. Patient seen for increasing weakness over the past several days. Because of his weakness, he is had a difficult time walking and ambulating from room to room. He gets short of breath with exertion. Because of this, the patient has had a couple of falls at home. His wife has dementia and he is the primary caregiver. Denies cough, but has been having a fever. While he has not been leaving his house, he has had family and friends over and does not wear a mask with them.  1/3:Patient was admitted with acute hypoxemic respiratory failure secondary to COVID-19 pneumonia. He appears to be on 2 L this morning and is quite somnolent. CRP is elevated and blood pressures are soft. We will plan to hold losartan and increase dexamethasone to 6 mg and change to IV. Continue to follow inflammatory markers.  1/4: Patient doing well on 1L [place]. PT evaluation due to fall at home with recommendations for SNF, but patient refuses. He cares for wife with dementia at home and has no transport to get to outpatient infusions. He will need to remain hospitalized for his full COVID treatment.  1/5:  Patient doing well.  Weaned to RA this am.  He continues to refuse SNF.  Remains deconditioned.   He cares for wife with dementia at home and has no transport to get to outpatient infusions. He will need to remain hospitalized for his full COVID treatment.   08/17/19: remdesivir infusion completed today; will continue steroids. Per PT recommendations SNF for rehab at discharge. Patient in agreement.  1-7 and 1-03/2020: Patient has completed IV remdesivir, not requiring oxygen supplementation and otherwise medically stable to go to a skilled nursing facility for further care and rehabilitation.   At this moment waiting on bed availability for discharge.  Completing steroids therapy orally.  08-20-19: afebrile, stable and waiting for bed availability at SNF for further rehab/care.  Assessment/Plan: Acute hypoxemic respiratory failure secondary to COVID-19 pneumonia -Patient has completed remdesivir  infusion on 08/17/2019  -continue steroids Decadron 6 mg daily. -Monitor inflammatory markers with downward trend noted, but with leukocytosis likely related to steroids at this point. -Continue vitamin C and zinc -oxygen weaned to RA. -CRP 7.1>>>3.4 -D-Dimer 2.56>>>1.41 -Ferritin 274>>>483 -PCT<0.10  Hypertension -soft blood pressure readings initially-->improving -Continue to hold home losartan for now -monitor VS.  Dyslipidemia -Continue statin -follow LFT's intermittently.  Depression -continue Lexapro -no SI or hallucinations.  Asthma/COPD -inhaler prn, no active symptoms otherwise noted -good O2 sat on RA -continue steroids tapering.  Pressure injury -present on admission -right buttocks stage 2 -continue preventive measurements and constant repositioning.   Physical deconditioning -seen by PT; SNF recommended -patient in agreement -SW aware and helping with discharge.  Disposition Plan:   To SNF when bed available. Supposedly bed will be available on 08/22/19  Family Communication: No family at bedside.  Consultants:  none  Code Status:  FULL   DVT Prophylaxis:  [place] Lovenox   Procedures: As Listed in Progress Note Above  Subjective: No fever, no chest pain, no shortness of breath.  Good oxygen saturation on room air.  Objective: Vitals:   08/20/19 0555 08/20/19 2105 08/20/19 2110 08/21/19 0600  BP: (!) 155/78 (!) 118/57 (!) 118/57 (!) 156/70  Pulse: (!) 58  66 66 (!) 58  Resp: 18 20 20 14   Temp: 98 F (36.7 C) 98.1 F (36.7 C) 98.1 F (36.7 C) (!) 97.4 F (36.3 C)  TempSrc: Oral Oral Oral Oral  SpO2: 92% 91% 91% 92%  Weight:        Height:        Intake/Output Summary (Last 24 hours) at 08/21/2019 1459 Last data filed at 08/21/2019 0000 Gross per 24 hour  Intake 1264.33 ml  Output 1700 ml  Net -435.67 ml   Weight change:   Exam: General exam: Alert, awake, oriented x 3; no fever, no chest pain, no nausea, no vomiting.  No requiring oxygen supplementation and in no acute distress.  Continued to be weak, off balance and feeling deconditioned. Respiratory system: Clear to auscultation. Respiratory effort normal. Cardiovascular system:RRR. No murmurs, rubs, gallops. Gastrointestinal system: Abdomen is nondistended, soft and nontender. No organomegaly or masses felt. Normal bowel sounds heard. Central nervous system: Alert and oriented. No focal neurological deficits. Extremities: No C/C/E, +pedal pulses Skin: No rashes, no petechiae; stage 2 right buttocks pressure injury unchanged and without signs of superimposed infection (this was present prior to admission). Psychiatry: Judgement and insight appear normal. Mood & affect appropriate.    Data Reviewed: I have personally reviewed following labs and imaging studies  Basic Metabolic Panel: Recent Labs  Lab 08/15/19 0718 08/16/19 0630 08/17/19 0420 08/18/19 0837 08/20/19 0549  NA 135 135 135 136  --   K 4.1 4.2 4.4 4.4  --   CL 103 105 103 105  --   CO2 23 23 23 26   --   GLUCOSE 135* 149* 160* 131*  --   BUN 44* 41* 41* 35*  --   CREATININE 1.53* 1.39* 1.32* 1.22 1.26*  CALCIUM 8.3* 8.2* 8.4* 8.4*  --   MG 2.2 2.2 2.3 2.2  --   PHOS 2.6 2.6 2.3* 1.9*  --    Liver Function Tests: Recent Labs  Lab 08/15/19 0718 08/16/19 0630 08/17/19 0420 08/18/19 0837  AST 63* 65* 65* 52*  ALT 38 45* 59* 61*  ALKPHOS 55 55 50 56  BILITOT 0.5 0.6 0.6 0.4  PROT 7.0 6.8 6.6 6.6  ALBUMIN 2.9* 2.9* 2.8* 2.9*   CBC: Recent Labs  Lab 08/15/19 0718 08/16/19 0630 08/17/19 0420 08/18/19 0837  WBC 16.4* 17.9* 16.2* 14.7*  NEUTROABS 13.6* 15.3* 13.4* 11.4*  HGB  11.7* 11.8* 11.7* 12.2*  HCT 36.1* 36.6* 37.7* 37.5*  MCV 101.7* 101.7* 103.0* 100.3*  PLT 162 173 191 204    Recent Results (from the past 240 hour(s))  Blood Culture (routine x 2)     Status: None   Collection Time: 08/13/19  3:38 PM   Specimen: BLOOD  Result Value Ref Range Status   Specimen Description BLOOD  Final   Special Requests NONE  Final   Culture   Final    NO GROWTH 5 DAYS Performed at [facility], [address], [place], [place] [address]    Report Status 08/18/2019 FINAL  Final  Blood Culture (routine x 2)     Status: None   Collection Time: 08/13/19  5:11 PM   Specimen: Left Antecubital; Blood  Result Value Ref Range Status   Specimen Description LEFT ANTECUBITAL  Final   Special Requests   Final    BOTTLES DRAWN AEROBIC AND ANAEROBIC Blood Culture adequate volume   Culture   Final    NO GROWTH 5 DAYS Performed at [facility],  [address], [place],  [address]    Report Status 08/18/2019 FINAL  Final     Scheduled Meds: . acetaminophen  1,000 mg Oral BID  . vitamin C  500 mg Oral Daily  . dexamethasone  6 mg Oral Daily  . enoxaparin (LOVENOX) injection  40 mg Subcutaneous Q24H  . escitalopram  10 mg Oral Daily  . pantoprazole  40 mg Oral Daily  . simvastatin  20 mg Oral q1800  . zinc sulfate  220 mg Oral Daily   Continuous Infusions: . sodium chloride 250 mL (08/14/19 1738)    Procedures/Studies: DG Chest Port 1 View  Result Date: 08/13/2019 CLINICAL DATA:  Cough, COVID-19 positive EXAM: PORTABLE CHEST 1 VIEW COMPARISON:  05/31/2009 FINDINGS: The heart size and mediastinal contours are within normal limits. Low lung volumes. Mild streaky opacity in the left lung base. No pleural effusion or pneumothorax. The visualized skeletal structures are unremarkable. IMPRESSION: Low lung volumes with streaky opacity in the left lung base, which could reflect atelectasis or infiltrate. Electronically Signed   By: Davina Poke D.O.   On: 08/13/2019  16:10    Barton Dubois, MD  [facility] Pager [phone].  08/21/2019, 2:59 PM   LOS: 8 days

## 2019-08-22 ENCOUNTER — Inpatient Hospital Stay
Admission: RE | Admit: 2019-08-22 | Discharge: 2019-09-10 | Disposition: A | Discharge status: Home / Self Care | Payer: Medicare Other | Source: Ambulatory Visit | Attending: Internal Medicine | Admitting: Internal Medicine

## 2019-08-22 DIAGNOSIS — R5381 Other malaise: Secondary | ICD-10-CM | POA: Diagnosis not present

## 2019-08-22 DIAGNOSIS — L89312 Pressure ulcer of right buttock, stage 2: Secondary | ICD-10-CM | POA: Diagnosis not present

## 2019-08-22 DIAGNOSIS — R531 Weakness: Secondary | ICD-10-CM | POA: Diagnosis not present

## 2019-08-22 DIAGNOSIS — U071 COVID-19: Secondary | ICD-10-CM | POA: Diagnosis not present

## 2019-08-22 DIAGNOSIS — Z741 Need for assistance with personal care: Secondary | ICD-10-CM | POA: Diagnosis not present

## 2019-08-22 DIAGNOSIS — J449 Chronic obstructive pulmonary disease, unspecified: Secondary | ICD-10-CM | POA: Diagnosis not present

## 2019-08-22 DIAGNOSIS — M47816 Spondylosis without myelopathy or radiculopathy, lumbar region: Secondary | ICD-10-CM | POA: Diagnosis not present

## 2019-08-22 DIAGNOSIS — R0602 Shortness of breath: Secondary | ICD-10-CM | POA: Diagnosis not present

## 2019-08-22 DIAGNOSIS — Z9181 History of falling: Secondary | ICD-10-CM | POA: Diagnosis not present

## 2019-08-22 DIAGNOSIS — N1831 Chronic kidney disease, stage 3a: Secondary | ICD-10-CM

## 2019-08-22 DIAGNOSIS — J1282 Pneumonia due to coronavirus disease 2019: Secondary | ICD-10-CM | POA: Diagnosis not present

## 2019-08-22 DIAGNOSIS — I10 Essential (primary) hypertension: Secondary | ICD-10-CM | POA: Diagnosis not present

## 2019-08-22 DIAGNOSIS — I1310 Hypertensive heart and chronic kidney disease without heart failure, with stage 1 through stage 4 chronic kidney disease, or unspecified chronic kidney disease: Secondary | ICD-10-CM | POA: Diagnosis not present

## 2019-08-22 DIAGNOSIS — M6281 Muscle weakness (generalized): Secondary | ICD-10-CM | POA: Diagnosis not present

## 2019-08-22 DIAGNOSIS — N179 Acute kidney failure, unspecified: Secondary | ICD-10-CM | POA: Diagnosis not present

## 2019-08-22 DIAGNOSIS — J069 Acute upper respiratory infection, unspecified: Secondary | ICD-10-CM | POA: Diagnosis not present

## 2019-08-22 DIAGNOSIS — J3089 Other allergic rhinitis: Secondary | ICD-10-CM | POA: Diagnosis not present

## 2019-08-22 DIAGNOSIS — E785 Hyperlipidemia, unspecified: Secondary | ICD-10-CM | POA: Diagnosis not present

## 2019-08-22 DIAGNOSIS — J9601 Acute respiratory failure with hypoxia: Secondary | ICD-10-CM | POA: Diagnosis not present

## 2019-08-22 DIAGNOSIS — R262 Difficulty in walking, not elsewhere classified: Secondary | ICD-10-CM | POA: Diagnosis not present

## 2019-08-22 MED ORDER — ZINC SULFATE 220 (50 ZN) MG PO CAPS: 220.0000 mg | ORAL_CAPSULE | Freq: Every day | ORAL | Status: DC

## 2019-08-22 MED ORDER — DEXAMETHASONE 6 MG PO TABS: 6.0000 mg | ORAL_TABLET | Freq: Every day | ORAL | Status: AC

## 2019-08-22 MED ORDER — GUAIFENESIN-DM 100-10 MG/5ML PO SYRP: 10.0000 mL | ORAL_SOLUTION | ORAL | Status: DC | PRN

## 2019-08-22 MED ORDER — LOSARTAN POTASSIUM 100 MG PO TABS: 50.0000 mg | ORAL_TABLET | Freq: Every day | ORAL | Status: DC

## 2019-08-22 NOTE — Discharge Summary (Signed)
Physician Discharge Summary  Casey Cooley [id number] DOB: [birth year] DOA: 08/13/2019  PCP: Dione Housekeeper, MD  Admit date: 08/13/2019 Discharge date: 08/22/2019  Time spent: 35 minutes  Recommendations for Outpatient Follow-up:  1. Repeat complete metabolic panel to follow electrolytes, renal function and LFTs. 2. Reassess blood pressure and further adjust antihypertensive regimen as needed   Discharge Diagnoses:  Active Problems:   Hyperlipemia   Essential hypertension   Acute respiratory disease due to COVID-19 virus   Generalized weakness   Pressure injury of skin   Physical deconditioning Chronic stage IIIa renal failure  Discharge Condition: Stable and improved.  Patient discharged to [facility] for further care and rehabilitation.  Will recommend follow-up with PCP in 2 weeks after discharge from nursing home.  Patient is a full code at time of discharge.  Diet recommendation: Heart healthy diet.  Filed Weights   08/14/19 1431  Weight: 102 kg    History of present illness:  As per H&P written by Dr. Nehemiah Settle on 08/13/2019 86 y.o. male with a history of hyperlipidemia, hypertension, asthma/COPD.  Patient seen for increasing weakness over the past several days.  Because of his weakness, he is had a difficult time walking and ambulating from room to room.  He gets short of breath with exertion.  Because of this, the patient has had a couple of falls at home.  His wife has dementia and he is the primary caregiver.  Denies cough, but has been having a fever.  While he has not been leaving his house, he has had family and friends over and does not wear a mask with them.  Emergency Department Course: White count 8.4, CRP 7.1, ferritin 215.  Lactic acid 1.1.  Chest x-ray shows streaky opacity in the left base.  Patient's O2 sats 90 to 95%.  With any prolonged conversation, his oxygen saturation drops to 88 to 90%.  Hospital Course:  Acute hypoxemic respiratory failure  secondary to COVID-19 pneumonia -Patient has completed remdesivir infusion on 08/17/2019  -continue steroids Decadron 6 mg daily; 4 more doses at discharge pending to complete treatment. -Monitor inflammatory markerswith downward trend noted, but with leukocytosis likely related to steroids, also trending down. -Continue vitamin C and zinc -Oxygen weaned to RA. -CRP 7.1>>>3.4 -D-Dimer 2.56>>>1.41 -Ferritin 274>>>483 -PCT<0.10  Hypertension -Stable and rising -Will resume losartan at half dose at time of discharge. -Continue following vital signs and further adjust antihypertensive regimen as needed. -Heart healthy diet has been encouraged.  Dyslipidemia -Continue statin -follow LFT's intermittently.  Depression -continue Lexapro -no SI or hallucinations.  Asthma/COPD -inhaler prn, no active symptoms otherwise noted -good O2 sat on RA -continue steroids tx as mentioned above. -No wheezing and is speaking in full sentences.  Pressure injury -present on admission -right buttocks stage 2 -continue preventive measurements and constant repositioning.   Physical deconditioning -seen by PT; SNF recommended -patient in agreement -Patient will be discharged to [facility] for further care and rehabilitation.  Chronic stage IIIa renal failure -Most likely associated with hypertension -Continue to maintain adequate hydration -Aim for excellent blood pressure control -Heart healthy diet. -Repeat complete metabolic panel follow-up visit to also follow renal function trend.  Procedures:  See below for x-ray reports  Consultations:  None   Discharge Exam: Vitals:   08/21/19 2207 08/22/19 0555  BP: (!) 128/55 (!) 141/56  Pulse: 66 (!) 55  Resp: 20 18  Temp: 98.3 F (36.8 C) (!) 97.5 F (36.4 C)  SpO2: 91% 91%  General exam: Alert, awake, oriented x 3; no fever, no chest pain, no nausea, no vomiting.  No requiring oxygen supplementation and in no acute  distress.  Continued to be weak and still feeling off balance and deconditioned. Respiratory system: Clear to auscultation. Respiratory effort normal. Cardiovascular system:RRR. No murmurs, rubs, gallops. Gastrointestinal system: Abdomen is nondistended, soft and nontender. No organomegaly or masses felt. Normal bowel sounds heard. Central nervous system: Alert and oriented. No focal neurological deficits. Extremities: No C/C/E, +pedal pulses Skin: No rashes, no petechiae; stage 2 right buttocks pressure injury unchanged and without signs of superimposed infection (this was present prior to admission). Psychiatry: Judgement and insight appear normal. Mood & affect appropriate.    Discharge Instructions   Discharge Instructions    Diet - low sodium heart healthy   Complete by: As directed    Discharge instructions   Complete by: As directed    Maintain adequate hydration Take medication as prescribed Physical therapy, rehabilitation and conditioning as per the skilled nurse facility protocol 4 more doses of Decadron pending at time of discharge. Follow heart healthy diet.     Allergies as of 08/22/2019   No Known Allergies     Medication List    STOP taking these medications   meloxicam 7.5 MG tablet Commonly known as: MOBIC     TAKE these medications   cholecalciferol 25 MCG (1000 UNIT) tablet Commonly known as: VITAMIN D3 Take 1,000 Units by mouth daily.   dexamethasone 6 MG tablet Commonly known as: DECADRON Take 1 tablet (6 mg total) by mouth daily for 4 doses. Start taking on: August 23, 2019   escitalopram 10 MG tablet Commonly known as: LEXAPRO Take 10 mg by mouth daily.   fluticasone 50 MCG/ACT nasal spray Commonly known as: FLONASE Place 2 sprays into both nostrils daily as needed for allergies or rhinitis.   guaiFENesin-dextromethorphan 100-10 MG/5ML syrup Commonly known as: ROBITUSSIN DM Take 10 mLs by mouth every 4 (four) hours as needed for cough.    latanoprost 0.005 % ophthalmic solution Commonly known as: XALATAN Place 1 drop into both eyes at bedtime.   losartan 100 MG tablet Commonly known as: COZAAR Take 0.5 tablets (50 mg total) by mouth daily. What changed: how much to take   multivitamin with minerals Tabs tablet Take 1 tablet by mouth daily.   polyethylene glycol powder 17 GM/SCOOP powder Commonly known as: GLYCOLAX/MIRALAX Take 17 g by mouth daily as needed for mild constipation or moderate constipation.   simvastatin 20 MG tablet Commonly known as: ZOCOR Take 20 mg by mouth daily at 6 PM.   Tylenol 8 Hour Arthritis Pain 650 MG CR tablet Generic drug: acetaminophen Take 1,300 mg by mouth 2 (two) times daily.   Ventolin HFA 108 (90 Base) MCG/ACT inhaler Generic drug: albuterol Inhale 1-2 puffs into the lungs every 6 (six) hours as needed for wheezing or shortness of breath.   Zegerid OTC 20-1100 MG Caps capsule Generic drug: Omeprazole-Sodium Bicarbonate Take 1 capsule by mouth daily before breakfast.   zinc sulfate 220 (50 Zn) MG capsule Take 1 capsule (220 mg total) by mouth daily. Start taking on: August 23, 2019      No Known Allergies Contact information for after-discharge care    [facility] .   Service: Skilled Nursing Contact information: [address] [place] [phone]              The  results of significant diagnostics from this hospitalization (including imaging, microbiology, ancillary and laboratory) are listed below for reference.    Significant Diagnostic Studies: DG Chest Port 1 View  Result Date: 08/13/2019 CLINICAL DATA:  Cough, COVID-19 positive EXAM: PORTABLE CHEST 1 VIEW COMPARISON:  05/31/2009 FINDINGS: The heart size and mediastinal contours are within normal limits. Low lung volumes. Mild streaky opacity in the left lung base. No pleural effusion or pneumothorax. The visualized skeletal  structures are unremarkable. IMPRESSION: Low lung volumes with streaky opacity in the left lung base, which could reflect atelectasis or infiltrate. Electronically Signed   By: Davina Poke D.O.   On: 08/13/2019 16:10    Microbiology: Recent Results (from the past 240 hour(s))  Blood Culture (routine x 2)     Status: None   Collection Time: 08/13/19  3:38 PM   Specimen: BLOOD  Result Value Ref Range Status   Specimen Description BLOOD  Final   Special Requests NONE  Final   Culture   Final    NO GROWTH 5 DAYS Performed at [facility], [address] [place], [place] [address]    Report Status 08/18/2019 FINAL  Final  Blood Culture (routine x 2)     Status: None   Collection Time: 08/13/19  5:11 PM   Specimen: Left Antecubital; Blood  Result Value Ref Range Status   Specimen Description LEFT ANTECUBITAL  Final   Special Requests   Final    BOTTLES DRAWN AEROBIC AND ANAEROBIC Blood Culture adequate volume   Culture   Final    NO GROWTH 5 DAYS Performed at [facility], [address] [place], [place] [address]    Report Status 08/18/2019 FINAL  Final     Labs: Basic Metabolic Panel: Recent Labs  Lab 08/16/19 0630 08/17/19 0420 08/18/19 0837 08/20/19 0549  NA 135 135 136  --   K 4.2 4.4 4.4  --   CL 105 103 105  --   CO2 23 23 26   --   GLUCOSE 149* 160* 131*  --   BUN 41* 41* 35*  --   CREATININE 1.39* 1.32* 1.22 1.26*  CALCIUM 8.2* 8.4* 8.4*  --   MG 2.2 2.3 2.2  --   PHOS 2.6 2.3* 1.9*  --    Liver Function Tests: Recent Labs  Lab 08/16/19 0630 08/17/19 0420 08/18/19 0837  AST 65* 65* 52*  ALT 45* 59* 61*  ALKPHOS 55 50 56  BILITOT 0.6 0.6 0.4  PROT 6.8 6.6 6.6  ALBUMIN 2.9* 2.8* 2.9*   CBC: Recent Labs  Lab 08/16/19 0630 08/17/19 0420 08/18/19 0837  WBC 17.9* 16.2* 14.7*  NEUTROABS 15.3* 13.4* 11.4*  HGB 11.8* 11.7* 12.2*  HCT 36.6* 37.7* 37.5*  MCV 101.7* 103.0* 100.3*  PLT 173 191 204    BNP (last 3 results) Recent Labs     08/13/19 1533  BNP 62.0    Signed:  Barton Dubois MD.  [facility] 08/22/2019, 12:02 PM

## 2019-08-22 NOTE — Care Management Important Message (Signed)
Important Message  Patient Details  Name: Casey Cooley MRN: [id number] Date of Birth: 12-May-1933   Medicare Important Message Given:  Yes(Cara, RN will deliver letter to patient due to contact precautions)     Tommy Medal 08/22/2019, 3:05 PM

## 2019-08-22 NOTE — TOC Transition Note (Signed)
Transition of Care [facility]) - CM/SW Discharge Note   Patient Details  Name: AMROM MASKELL MRN: [id number] Date of Birth: [birth year]  Transition of Care [facility]) CM/SW Contact:  Boneta Lucks, RN Phone Number: 08/22/2019, 12:20 PM   Clinical Narrative:   [facility] can [facility] patient today. MD and RN updated.  CM called patient's wife to advise her patient patient would be going to [facility] this afternoon. She will call [facility] to take belongings. Clinicals sent in the hub.     Final next level of care: [place]     Patient Goals and CMS Choice Patient states their goals for this hospitalization and ongoing recovery are:: to go to SNF CMS Medicare.gov Compare Post Acute Care list provided to:: Patient Choice offered to / list presented to : Patient  Discharge Placement              Patient chooses bed at: [facility] Patient to be transferred to facility by: [facility] Staff   Patient and family notified of of transfer: 08/22/19

## 2019-08-23 ENCOUNTER — Encounter: Payer: Self-pay | Admitting: Adult Health

## 2019-08-23 ENCOUNTER — Non-Acute Institutional Stay (SKILLED_NURSING_FACILITY): Payer: Medicare Other | Admitting: Adult Health

## 2019-08-23 DIAGNOSIS — M47816 Spondylosis without myelopathy or radiculopathy, lumbar region: Secondary | ICD-10-CM | POA: Diagnosis not present

## 2019-08-23 DIAGNOSIS — H40053 Ocular hypertension, bilateral: Secondary | ICD-10-CM | POA: Insufficient documentation

## 2019-08-23 DIAGNOSIS — E785 Hyperlipidemia, unspecified: Secondary | ICD-10-CM | POA: Diagnosis not present

## 2019-08-23 DIAGNOSIS — J3089 Other allergic rhinitis: Secondary | ICD-10-CM | POA: Insufficient documentation

## 2019-08-23 DIAGNOSIS — J069 Acute upper respiratory infection, unspecified: Secondary | ICD-10-CM

## 2019-08-23 DIAGNOSIS — F339 Major depressive disorder, recurrent, unspecified: Secondary | ICD-10-CM

## 2019-08-23 DIAGNOSIS — K219 Gastro-esophageal reflux disease without esophagitis: Secondary | ICD-10-CM | POA: Insufficient documentation

## 2019-08-23 DIAGNOSIS — I10 Essential (primary) hypertension: Secondary | ICD-10-CM

## 2019-08-23 DIAGNOSIS — U071 COVID-19: Secondary | ICD-10-CM

## 2019-08-23 DIAGNOSIS — K5909 Other constipation: Secondary | ICD-10-CM | POA: Insufficient documentation

## 2019-08-23 DIAGNOSIS — R5381 Other malaise: Secondary | ICD-10-CM

## 2019-08-23 NOTE — Progress Notes (Signed)
Location:    [facility] Room Number: [id number] Place of Service:  SNF (31) Phillips Grout NP    CODE STATUS: DNR  No Known Allergies  Chief Complaint  Patient presents with  . Hospitalization Follow-up    Hospital Follow up    HPI:  He is a 86 year old man who has been hospitalized from 08-13-19 through 08-22-19. He had weakness at home with shortness of breath and had a couple of falls. He was found to be covid positive and was treated for acute respiratory failure. He is here for short term rehab with his goal to return back home. He does have weakness present; is easily fatigued. He has chronic back pain which is being managed. He continues to be followed for his chronic illnesses including: hypertension; hyperlipidemia; back pain.   Past Medical History:  Diagnosis Date  . Hypercholesterolemia     Past Surgical History:  Procedure Laterality Date  . EYE SURGERY    . FOOT SURGERY      Social History   Socioeconomic History  . Marital status: Married    Spouse name: Not on file  . Number of children: Not on file  . Years of education: Not on file  . Highest education level: Not on file  Occupational History  . Not on file  Tobacco Use  . Smoking status: Never Smoker  . Smokeless tobacco: Never Used  Substance and Sexual Activity  . Alcohol use: Not on file    Comment: occ  . Drug use: Never  . Sexual activity: Not on file  Other Topics Concern  . Not on file  Social History Narrative  . Not on file   Social Determinants of Health   Financial Resource Strain:   . Difficulty of Paying Living Expenses: Not on file  Food Insecurity:   . Worried About Charity fundraiser in the Last Year: Not on file  . Ran Out of Food in the Last Year: Not on file  Transportation Needs:   . Lack of Transportation (Medical): Not on file  . Lack of Transportation (Non-Medical): Not on file  Physical Activity:   . Days of Exercise per Week: Not on file  .  Minutes of Exercise per Session: Not on file  Stress:   . Feeling of Stress : Not on file  Social Connections:   . Frequency of Communication with Friends and Family: Not on file  . Frequency of Social Gatherings with Friends and Family: Not on file  . Attends Religious Services: Not on file  . Active Member of Clubs or Organizations: Not on file  . Attends Archivist Meetings: Not on file  . Marital Status: Not on file  Intimate Partner Violence:   . Fear of Current or Ex-Partner: Not on file  . Emotionally Abused: Not on file  . Physically Abused: Not on file  . Sexually Abused: Not on file   History reviewed. No pertinent family history.    VITAL SIGNS BP 139/74   Pulse 68   Temp (!) 97.2 F (36.2 C) (Oral)   Resp 20   Ht 6\' 1"  (1.854 m)   Wt 218 lb 12.8 oz (99.2 kg)   BMI 28.87 kg/m   Outpatient Encounter Medications as of 08/23/2019  Medication Sig  . acetaminophen (TYLENOL 8 HOUR ARTHRITIS PAIN) 650 MG CR tablet Take 1,300 mg by mouth 2 (two) times daily.  Marland Kitchen albuterol (VENTOLIN HFA) 108 (90 Base)  MCG/ACT inhaler Inhale 1-2 puffs into the lungs every 6 (six) hours as needed for wheezing or shortness of breath.   . cholecalciferol (VITAMIN D3) 25 MCG (1000 UT) tablet Take 1,000 Units by mouth daily.  Marland Kitchen dexamethasone (DECADRON) 6 MG tablet Take 1 tablet (6 mg total) by mouth daily for 4 doses.  Marland Kitchen escitalopram (LEXAPRO) 10 MG tablet Take 10 mg by mouth daily.  . fluticasone (FLONASE) 50 MCG/ACT nasal spray Place 2 sprays into both nostrils daily as needed for allergies or rhinitis.   Marland Kitchen guaiFENesin-dextromethorphan (ROBITUSSIN DM) 100-10 MG/5ML syrup Take 10 mLs by mouth every 4 (four) hours as needed for cough.  . latanoprost (XALATAN) 0.005 % ophthalmic solution Place 1 drop into both eyes at bedtime.   Marland Kitchen losartan (COZAAR) 100 MG tablet Take 0.5 tablets (50 mg total) by mouth daily.  . Multiple Vitamin (MULTIVITAMIN WITH MINERALS) TABS tablet Take 1 tablet by  mouth daily.  . NON FORMULARY Diet: No Added Salt  . Omeprazole-Sodium Bicarbonate (ZEGERID OTC) 20-1100 MG CAPS capsule Take 1 capsule by mouth daily before breakfast.  . polyethylene glycol powder (GLYCOLAX/MIRALAX) 17 GM/SCOOP powder Take 17 g by mouth daily as needed for mild constipation or moderate constipation.  . simvastatin (ZOCOR) 20 MG tablet Take 20 mg by mouth daily at 6 PM.   . zinc sulfate 220 (50 Zn) MG capsule Take 1 capsule (220 mg total) by mouth daily.   No facility-administered encounter medications on file as of 08/23/2019.     SIGNIFICANT DIAGNOSTIC EXAMS  TODAY;   08-13-19: chest x-ray: Low lung volumes with streaky opacity in the left lung base, which could reflect atelectasis or infiltrate.  LABS REVIEWED TODAY;   08-13-19: wbc 8.4; hgb 12.7; hct 40.5; mcv 104.9 plt 165; glucose 86; bun 29; creat 1.53; k+ 4.1; na++ 135; ca 8.4; ast 55; albumin 3.6 blood culture: no growth; d-dimer: 2.56; ferritin 215 08-15-19: wbc 16.4; hgb 11.7; hct 36.1; mcv 101.7 plt 162; glucose 135; bun 44; creat 1.53; k+ 4.1; na++ 135; ca 8.3; ast 63; albumin 2.9; d-dimer 1.69; mag 2.2 ;phos 2.6 ferritin 398 08-18-19: wbc 14.7; hgb 12.2; hct 37.5 mcv 100.3 plt 204; glucose 131; bun 35; creat 1.22; k+ 4.4; an++ 136 ca 8.4; ast 52; alt 61; albumin 2.9; d-dimer 1.34 ferritin 516 phos 1.9; mag 2.2   Review of Systems  Constitutional: Positive for malaise/fatigue.  Respiratory: Negative for cough and shortness of breath.   Cardiovascular: Negative for chest pain, palpitations and leg swelling.  Gastrointestinal: Negative for abdominal pain, constipation and heartburn.  Musculoskeletal: Positive for back pain. Negative for joint pain and myalgias.       Chronic from history of falls   Skin: Negative.   Neurological: Positive for weakness. Negative for dizziness.  Psychiatric/Behavioral: The patient is not nervous/anxious.     Physical Exam Constitutional:      General: He is not in acute  distress.    Appearance: He is well-developed. He is obese. He is not diaphoretic.  Neck:     Thyroid: No thyromegaly.  Cardiovascular:     Rate and Rhythm: Normal rate and regular rhythm.     Pulses: Normal pulses.     Heart sounds: Normal heart sounds.  Pulmonary:     Effort: Pulmonary effort is normal. No respiratory distress.     Breath sounds: Normal breath sounds.  Abdominal:     General: Bowel sounds are normal. There is no distension.     Palpations: Abdomen is  soft.     Tenderness: There is no abdominal tenderness.  Musculoskeletal:        General: Normal range of motion.     Cervical back: Neck supple.     Right lower leg: No edema.     Left lower leg: No edema.  Lymphadenopathy:     Cervical: No cervical adenopathy.  Skin:    General: Skin is warm and dry.  Neurological:     Mental Status: He is alert and oriented to person, place, and time.     Comments: Some disorientation present   Psychiatric:        Mood and Affect: Mood normal.       ASSESSMENT/ PLAN:  TODAY  1. Acute respiratory disease due to covid 19: is presently stable is on room air; will continue albuterol 2 puffs every 6 hours as needed will continue decadron 6 mg for 4 more days will monitor his status.   2. Essential hypertension: is stable b/p 134/79 will continue cozaar 50 mg daily   3. Chronic non-seasonal allergic rhinitis: is stable will continue flonase daily as needed  4. Facet degeneration of lumbar spine: has chronic back pain: is stable will continue tylenol 1300 mg twice daily   5. Hyperlipidemia unspecified hyperlipidemia type: is stable will continue zocor 20 mg daily   6. Physical deconditioning: has weakness from covid 19; will continue therapy as directed to improve upon his level of independence with his adls. Will monitor   7. Chronic constipation: is stable will continue miralax daily as needed  8. GERD without esophagitis: is stable will continue zegerid 20/100 mg  daily   9. Major depression recurrent chronic is stable will continue lexapro 10 mg daily   10. Increase intraocular pressure: bilateral is stable will continue xalatan to both eyes nightly      MD is aware of resident's narcotic use and is in agreement with current plan of care. We will attempt to wean resident as appropriate.  Ok Edwards NP [facility]  Contact [phone] Monday through Friday 8am- 5pm  After hours call [phone]

## 2019-08-24 ENCOUNTER — Non-Acute Institutional Stay (SKILLED_NURSING_FACILITY): Payer: Medicare Other | Admitting: Internal Medicine

## 2019-08-24 ENCOUNTER — Encounter: Payer: Self-pay | Admitting: Internal Medicine

## 2019-08-24 DIAGNOSIS — N189 Chronic kidney disease, unspecified: Secondary | ICD-10-CM

## 2019-08-24 DIAGNOSIS — N179 Acute kidney failure, unspecified: Secondary | ICD-10-CM

## 2019-08-24 DIAGNOSIS — I10 Essential (primary) hypertension: Secondary | ICD-10-CM

## 2019-08-24 DIAGNOSIS — U071 COVID-19: Secondary | ICD-10-CM

## 2019-08-24 DIAGNOSIS — J9601 Acute respiratory failure with hypoxia: Secondary | ICD-10-CM | POA: Diagnosis not present

## 2019-08-24 DIAGNOSIS — F339 Major depressive disorder, recurrent, unspecified: Secondary | ICD-10-CM

## 2019-08-24 DIAGNOSIS — J1282 Pneumonia due to coronavirus disease 2019: Secondary | ICD-10-CM

## 2019-08-24 DIAGNOSIS — E785 Hyperlipidemia, unspecified: Secondary | ICD-10-CM | POA: Diagnosis not present

## 2019-08-24 NOTE — Progress Notes (Signed)
: Provider:  Hennie Duos., MD Location:  [place] Room Number: 156-W Place of Service:  SNF ([id number])  PCP: Dione Housekeeper, MD Patient Care Team: Dione Housekeeper, MD as PCP - General (Family Medicine)  Extended Emergency Contact Information Primary Emergency Contact: Vanover,Delois Address: [address]          [place], [place] Phone: [phone] Mobile Phone: [phone] Relation: Spouse Secondary Emergency Contact: Latavian, Bricker Mobile Phone: [phone] Relation: Niece     Allergies: Patient has no known allergies.  Chief Complaint  Patient presents with  . New Admit To SNF    New admission to [facility]    HPI: Patient is an 86 y.o. male with hyperlipidemia, hypertension, asthma/COPD who was brought to [facility] for weakness, difficulty ambulating and dyspnea on exertion several falls at home over the prior several days.  Denies cough but admits to fever.  While he has not been leaving his house he has had family and friends over and does not wear a mask with him.  In the ED he was found to have a white count of 8.4, CRP 7.1 ferritin 215, chest x-ray with streaky opacity in the left base and O2 sats 90 to 95% after prolonged conversation O2 saturation dropped to 88 to 90%.  Patient is admitted to [facility] from 1/12-11 where he was treated for acute respiratory failure with hypoxia due to COVID-19 pneumonia..  Patient was treated with remdesivir and Decadron, his inflammatory markers improved.  Patient did have acute kidney injury on chronic kidney disease stage III which improved with hydration.  Patient is admitted to skilled nursing facility for OT/PT.  While at skilled facility patient will be followed for depression treated with Lexapro hypertension treated with Cozaar and hyperlipidemia treated with Zocor.  Past Medical History:  Diagnosis Date  . Hypercholesterolemia   . RETINAL DETACHMENT, BILATERAL, HX OF  05/31/2009   Qualifier: Diagnosis of  By: Tilden Dome      Past Surgical History:  Procedure Laterality Date  . EYE SURGERY    . FOOT SURGERY      Allergies as of 08/24/2019   No Known Allergies     Medication List    Notice   This visit is during an admission. Changes to the med list made in this visit will be reflected in the After Visit Summary of the admission.    Current Outpatient Medications on File Prior to Visit  Medication Sig Dispense Refill  . acetaminophen (TYLENOL 8 HOUR ARTHRITIS PAIN) 650 MG CR tablet Take 1,300 mg by mouth 2 (two) times daily.    Marland Kitchen albuterol (VENTOLIN HFA) 108 (90 Base) MCG/ACT inhaler Inhale 2 puffs into the lungs every 6 (six) hours as needed for wheezing or shortness of breath.     . cholecalciferol (VITAMIN D3) 25 MCG (1000 UT) tablet Take 1,000 Units by mouth daily.    Marland Kitchen escitalopram (LEXAPRO) 10 MG tablet Take 10 mg by mouth daily.    . fluticasone (FLONASE) 50 MCG/ACT nasal spray Place 2 sprays into both nostrils daily as needed for allergies or rhinitis.     Marland Kitchen guaiFENesin-dextromethorphan (ROBITUSSIN DM) 100-10 MG/5ML syrup Take 10 mLs by mouth every 4 (four) hours as needed for cough.    . latanoprost (XALATAN) 0.005 % ophthalmic solution Place 1 drop into both eyes at bedtime.     Marland Kitchen losartan (COZAAR) 100 MG tablet Take 0.5 tablets (50 mg total) by mouth daily.    Marland Kitchen  Multiple Vitamin (MULTIVITAMIN WITH MINERALS) TABS tablet Take 1 tablet by mouth daily.    . NON FORMULARY Diet: No Added Salt    . Omeprazole-Sodium Bicarbonate (ZEGERID OTC) 20-1100 MG CAPS capsule Take 1 capsule by mouth daily before breakfast.    . polyethylene glycol powder (GLYCOLAX/MIRALAX) 17 GM/SCOOP powder Take 17 g by mouth daily as needed for mild constipation or moderate constipation.    . simvastatin (ZOCOR) 20 MG tablet Take 20 mg by mouth daily at 6 PM.     . zinc sulfate 220 (50 Zn) MG capsule Take 1 capsule (220 mg total) by mouth daily.     No current  facility-administered medications on file prior to visit.     No orders of the defined types were placed in this encounter.   Immunization History  Administered Date(s) Administered  . Influenza Whole 05/14/2009  . Influenza, High Dose Seasonal PF 05/31/2015, 05/14/2016, 06/18/2017, 06/23/2018  . Influenza,trivalent, recombinat, inj, PF 06/08/2014  . Influenza-Unspecified 06/08/2014, 05/31/2015, 05/14/2016, 06/18/2017, 06/23/2018  . Pneumococcal Conjugate-13 10/09/2015  . Pneumococcal Polysaccharide-23 11/19/2016  . Tdap 02/23/2013, 05/27/2017    Social History   Tobacco Use  . Smoking status: Never Smoker  . Smokeless tobacco: Never Used  Substance Use Topics  . Alcohol use: Not on file    Comment: occ    Family history is dad with abdominal aortic aneurysm, sister with pancreatic cancer.  History reviewed. No pertinent family history.    Review of Systems  GENERAL:  no fevers,+ fatigue,+ appetite changes SKIN: No itching, or rash EYES: No eye pain, redness, discharge EARS: No earache, tinnitus, change in hearing NOSE: No congestion, drainage or bleeding  MOUTH/THROAT: No mouth or tooth pain, No sore throat RESPIRATORY: No cough, wheezing, SOB CARDIAC: No chest pain, palpitations, lower extremity edema  GI: No abdominal pain, No N/V/D or constipation, No heartburn or reflux  GU: No dysuria, frequency or urgency, or incontinence  MUSCULOSKELETAL: No unrelieved bone/joint pain NEUROLOGIC: No headache, dizziness or focal weakness PSYCHIATRIC: No c/o anxiety or sadness   Vitals:   08/24/19 1602  BP: 140/69  Pulse: 69  Resp: 16  Temp: (!) 97 F (36.1 C)    SpO2 Readings from Last 1 Encounters:  08/22/19 91%   Body mass index is 28.87 kg/m.     Physical Exam  GENERAL APPEARANCE: Alert, conversant,  No acute distress.  Obese SKIN: No diaphoresis rash HEAD: Normocephalic, atraumatic  EYES: Conjunctiva/lids clear. Pupils round, reactive. EOMs intact.    EARS: External exam WNL, canals clear. Hearing grossly normal.  NOSE: No deformity or discharge.  MOUTH/THROAT: Lips w/o lesions  RESPIRATORY: Breathing is even, unlabored. Lung sounds are clear   CARDIOVASCULAR: Heart RRR no murmurs, rubs or gallops. No peripheral edema.   GASTROINTESTINAL: Abdomen is soft, non-tender, not distended w/ normal bowel sounds. GENITOURINARY: Bladder non tender, not distended  MUSCULOSKELETAL: No abnormal joints or musculature NEUROLOGIC:  Cranial nerves 2-12 grossly intact. Moves all extremities  PSYCHIATRIC: Mood and affect appropriate to situation with mild intermittent confusion, no behavioral issues  Patient Active Problem List   Diagnosis Date Noted  . Chronic non-seasonal allergic rhinitis 08/23/2019  . Chronic constipation 08/23/2019  . Major depression, recurrent, chronic ([place]) 08/23/2019  . Increased intraocular pressure, bilateral 08/23/2019  . GERD without esophagitis 08/23/2019  . Physical deconditioning   . Pressure injury of skin 08/17/2019  . Generalized weakness   . Acute respiratory disease due to COVID-19 virus 08/13/2019  . Facet degeneration of lumbar  region 03/28/2018  . Hyperlipemia 05/31/2009  . Essential hypertension 05/31/2009      Labs reviewed: Basic Metabolic Panel:    Component Value Date/Time   NA 136 08/18/2019 0837   K 4.4 08/18/2019 0837   CL 105 08/18/2019 0837   CO2 26 08/18/2019 0837   GLUCOSE 131 (H) 08/18/2019 0837   BUN 35 (H) 08/18/2019 0837   CREATININE 1.26 (H) 08/20/2019 0549   CALCIUM 8.4 (L) 08/18/2019 0837   PROT 6.6 08/18/2019 0837   ALBUMIN 2.9 (L) 08/18/2019 0837   AST 52 (H) 08/18/2019 0837   ALT 61 (H) 08/18/2019 0837   ALKPHOS 56 08/18/2019 0837   BILITOT 0.4 08/18/2019 0837   GFRNONAA 52 (L) 08/20/2019 0549   GFRAA >60 08/20/2019 0549    Recent Labs    08/16/19 0630 08/17/19 0420 08/18/19 0837 08/20/19 0549  NA 135 135 136  --   K 4.2 4.4 4.4  --   CL 105 103 105  --   CO2  23 23 26   --   GLUCOSE 149* 160* 131*  --   BUN 41* 41* 35*  --   CREATININE 1.39* 1.32* 1.22 1.26*  CALCIUM 8.2* 8.4* 8.4*  --   MG 2.2 2.3 2.2  --   PHOS 2.6 2.3* 1.9*  --    Liver Function Tests: Recent Labs    08/16/19 0630 08/17/19 0420 08/18/19 0837  AST 65* 65* 52*  ALT 45* 59* 61*  ALKPHOS 55 50 56  BILITOT 0.6 0.6 0.4  PROT 6.8 6.6 6.6  ALBUMIN 2.9* 2.8* 2.9*   No results for input(s): LIPASE, AMYLASE in the last 8760 hours. No results for input(s): AMMONIA in the last 8760 hours. CBC: Recent Labs    08/16/19 0630 08/17/19 0420 08/18/19 0837  WBC 17.9* 16.2* 14.7*  NEUTROABS 15.3* 13.4* 11.4*  HGB 11.8* 11.7* 12.2*  HCT 36.6* 37.7* 37.5*  MCV 101.7* 103.0* 100.3*  PLT 173 191 204   Lipid Recent Labs    08/13/19 1711  TRIG 89    Cardiac Enzymes: No results for input(s): CKTOTAL, CKMB, CKMBINDEX, TROPONINI in the last 8760 hours. BNP: Recent Labs    08/13/19 1533  BNP 62.0   No results found for: MICROALBUR No results found for: HGBA1C No results found for: TSH No results found for: VITAMINB12 No results found for: FOLATE Lab Results  Component Value Date   [id number] (H) 08/18/2019    Imaging and Procedures obtained prior to SNF admission: No results found.   Not all labs, radiology exams or other studies done during hospitalization come through on my EPIC note; however they are reviewed by me.    Assessment and Plan  Acute respiratory failure with hypoxia/COVID-19 pneumonia-patient pleated remdesivir on 1/6; patient continue Decadron 6 mg daily for 4 more doses after discharge; inflammatory markers improved CRP 7.1-3.4, D-dimer 2.56-1.41 ferritin 274-483 SNF-admitted for OT/PT.  Continue Decadron 6 mg daily for 4 more doses continue vitamin C 1000 mg daily, zinc sulfate 220 mg daily  AKI on CKD 3  SNF-improved with IV fluids; follow-up BMP  Hypertension SNF-controlled; continue losartan 100 mg daily; continue to  monitor  Depression SNF-appears controlled; continue Lexapro 10 mg daily  Hyperlipidemia SNF-not stated as uncontrolled; continue Zocor 20 mg daily   Time spent greater than 45 minutes;> 50% of time with patient was spent reviewing records, labs, tests and studies, counseling and developing plan of care  Hennie Duos, MD

## 2019-08-25 ENCOUNTER — Non-Acute Institutional Stay (SKILLED_NURSING_FACILITY): Payer: Medicare Other | Admitting: Adult Health

## 2019-08-25 ENCOUNTER — Encounter: Payer: Self-pay | Admitting: Adult Health

## 2019-08-25 DIAGNOSIS — J069 Acute upper respiratory infection, unspecified: Secondary | ICD-10-CM | POA: Diagnosis not present

## 2019-08-25 DIAGNOSIS — U071 COVID-19: Secondary | ICD-10-CM

## 2019-08-25 DIAGNOSIS — M47816 Spondylosis without myelopathy or radiculopathy, lumbar region: Secondary | ICD-10-CM

## 2019-08-25 DIAGNOSIS — R5381 Other malaise: Secondary | ICD-10-CM

## 2019-08-25 NOTE — Progress Notes (Signed)
Location:    [place] Room Number: [id number] Place of Service:  SNF (31) Phillips Grout NP    CODE STATUS: DNR  No Known Allergies  Chief Complaint  Patient presents with  . Acute Visit    72 hour Care Plan Meeting    HPI:  We have come together for his 69 hour care plan meeting. He is the caregiver for his wife. He is having a poor ability to focus. He continues to have fatigue. He does participate in therapy. His goal is to return back home. He has a walker at home. He does have some family support. He will benefit from having social worker with his home health.   Past Medical History:  Diagnosis Date  . Hypercholesterolemia   . RETINAL DETACHMENT, BILATERAL, HX OF 05/31/2009   Qualifier: Diagnosis of  By: Tilden Dome      Past Surgical History:  Procedure Laterality Date  . EYE SURGERY    . FOOT SURGERY      Social History   Socioeconomic History  . Marital status: Married    Spouse name: Not on file  . Number of children: Not on file  . Years of education: Not on file  . Highest education level: Not on file  Occupational History  . Not on file  Tobacco Use  . Smoking status: Never Smoker  . Smokeless tobacco: Never Used  Substance and Sexual Activity  . Alcohol use: Not on file    Comment: occ  . Drug use: Never  . Sexual activity: Not on file  Other Topics Concern  . Not on file  Social History Narrative  . Not on file   Social Determinants of Health   Financial Resource Strain:   . Difficulty of Paying Living Expenses: Not on file  Food Insecurity:   . Worried About Charity fundraiser in the Last Year: Not on file  . Ran Out of Food in the Last Year: Not on file  Transportation Needs:   . Lack of Transportation (Medical): Not on file  . Lack of Transportation (Non-Medical): Not on file  Physical Activity:   . Days of Exercise per Week: Not on file  . Minutes of Exercise per Session: Not on file  Stress:   . Feeling  of Stress : Not on file  Social Connections:   . Frequency of Communication with Friends and Family: Not on file  . Frequency of Social Gatherings with Friends and Family: Not on file  . Attends Religious Services: Not on file  . Active Member of Clubs or Organizations: Not on file  . Attends Archivist Meetings: Not on file  . Marital Status: Not on file  Intimate Partner Violence:   . Fear of Current or Ex-Partner: Not on file  . Emotionally Abused: Not on file  . Physically Abused: Not on file  . Sexually Abused: Not on file   History reviewed. No pertinent family history.    VITAL SIGNS BP 138/77   Pulse 69   Temp 97.8 F (36.6 C) (Oral)   Resp 16   Ht 6\' 1"  (1.854 m)   Wt 218 lb 12.8 oz (99.2 kg)   BMI 28.87 kg/m   Outpatient Encounter Medications as of 08/25/2019  Medication Sig  . acetaminophen (TYLENOL 8 HOUR ARTHRITIS PAIN) 650 MG CR tablet Take 1,300 mg by mouth 2 (two) times daily.  Marland Kitchen albuterol (VENTOLIN HFA) 108 (90 Base) MCG/ACT inhaler Inhale  2 puffs into the lungs every 6 (six) hours as needed for wheezing or shortness of breath.   . cholecalciferol (VITAMIN D3) 25 MCG (1000 UT) tablet Take 1,000 Units by mouth daily.  Marland Kitchen dexamethasone (DECADRON) 6 MG tablet Take 1 tablet (6 mg total) by mouth daily for 4 doses.  Marland Kitchen escitalopram (LEXAPRO) 10 MG tablet Take 10 mg by mouth daily.  . fluticasone (FLONASE) 50 MCG/ACT nasal spray Place 2 sprays into both nostrils daily as needed for allergies or rhinitis.   Marland Kitchen guaiFENesin-dextromethorphan (ROBITUSSIN DM) 100-10 MG/5ML syrup Take 10 mLs by mouth every 4 (four) hours as needed for cough.  . latanoprost (XALATAN) 0.005 % ophthalmic solution Place 1 drop into both eyes at bedtime.   Marland Kitchen losartan (COZAAR) 100 MG tablet Take 0.5 tablets (50 mg total) by mouth daily.  . Multiple Vitamin (MULTIVITAMIN WITH MINERALS) TABS tablet Take 1 tablet by mouth daily.  . NON FORMULARY Diet: No Added Salt  . Omeprazole-Sodium  Bicarbonate (ZEGERID OTC) 20-1100 MG CAPS capsule Take 1 capsule by mouth daily before breakfast.  . polyethylene glycol powder (GLYCOLAX/MIRALAX) 17 GM/SCOOP powder Take 17 g by mouth daily as needed for mild constipation or moderate constipation.  . simvastatin (ZOCOR) 20 MG tablet Take 20 mg by mouth daily at 6 PM.   . zinc sulfate 220 (50 Zn) MG capsule Take 1 capsule (220 mg total) by mouth daily.   No facility-administered encounter medications on file as of 08/25/2019.     SIGNIFICANT DIAGNOSTIC EXAMS  PREVIOUS;   08-13-19: chest x-ray: Low lung volumes with streaky opacity in the left lung base, which could reflect atelectasis or infiltrate.  NO NEW EXAMS   LABS REVIEWED PREVIOUS;   08-13-19: wbc 8.4; hgb 12.7; hct 40.5; mcv 104.9 plt 165; glucose 86; bun 29; creat 1.53; k+ 4.1; na++ 135; ca 8.4; ast 55; albumin 3.6 blood culture: no growth; d-dimer: 2.56; ferritin 215 08-15-19: wbc 16.4; hgb 11.7; hct 36.1; mcv 101.7 plt 162; glucose 135; bun 44; creat 1.53; k+ 4.1; na++ 135; ca 8.3; ast 63; albumin 2.9; d-dimer 1.69; mag 2.2 ;phos 2.6 ferritin 398 08-18-19: wbc 14.7; hgb 12.2; hct 37.5 mcv 100.3 plt 204; glucose 131; bun 35; creat 1.22; k+ 4.4; an++ 136 ca 8.4; ast 52; alt 61; albumin 2.9; d-dimer 1.34 ferritin 516 phos 1.9; mag 2.2   NO NEW LABS.   Review of Systems  Constitutional: Positive for malaise/fatigue.  Respiratory: Negative for cough and shortness of breath.   Cardiovascular: Negative for chest pain, palpitations and leg swelling.  Gastrointestinal: Negative for abdominal pain, constipation and heartburn.  Musculoskeletal: Negative for back pain, joint pain and myalgias.  Skin: Negative.   Neurological: Positive for weakness. Negative for dizziness.  Psychiatric/Behavioral: The patient is not nervous/anxious.     Physical Exam Constitutional:      General: He is not in acute distress.    Appearance: He is well-developed. He is obese. He is not diaphoretic.  Neck:       Thyroid: No thyromegaly.  Cardiovascular:     Rate and Rhythm: Normal rate and regular rhythm.     Pulses: Normal pulses.     Heart sounds: Normal heart sounds.  Pulmonary:     Effort: Pulmonary effort is normal. No respiratory distress.     Breath sounds: Normal breath sounds.  Abdominal:     General: Bowel sounds are normal. There is no distension.     Palpations: Abdomen is soft.     Tenderness:  There is no abdominal tenderness.  Musculoskeletal:        General: Normal range of motion.     Cervical back: Neck supple.     Right lower leg: No edema.     Left lower leg: No edema.  Lymphadenopathy:     Cervical: No cervical adenopathy.  Skin:    General: Skin is warm and dry.  Neurological:     Mental Status: He is alert and oriented to person, place, and time.     Comments: Some disorientation present   Psychiatric:        Mood and Affect: Mood normal.       ASSESSMENT/ PLAN:  TODAY;   1. Acute respiratory disease due to covid 19 2. Facet degeneration of lumbar region 3. Physical deconditioning  Will continue therapy as directed Will continue current medications Will continue current medications His goal is to return home Will need social worker upon discharge.    MD is aware of resident's narcotic use and is in agreement with current plan of care. We will attempt to wean resident as appropriate.  Ok Edwards NP [facility]  Contact [phone] Monday through Friday 8am- 5pm  After hours call [phone]

## 2019-08-29 ENCOUNTER — Encounter: Payer: Self-pay | Admitting: Internal Medicine

## 2019-08-29 DIAGNOSIS — N179 Acute kidney failure, unspecified: Secondary | ICD-10-CM | POA: Insufficient documentation

## 2019-08-29 DIAGNOSIS — J9601 Acute respiratory failure with hypoxia: Secondary | ICD-10-CM | POA: Insufficient documentation

## 2019-08-31 ENCOUNTER — Non-Acute Institutional Stay (SKILLED_NURSING_FACILITY): Payer: Medicare Other | Admitting: Adult Health

## 2019-08-31 ENCOUNTER — Encounter: Payer: Self-pay | Admitting: Adult Health

## 2019-08-31 DIAGNOSIS — J9601 Acute respiratory failure with hypoxia: Secondary | ICD-10-CM | POA: Diagnosis not present

## 2019-08-31 DIAGNOSIS — I10 Essential (primary) hypertension: Secondary | ICD-10-CM | POA: Diagnosis not present

## 2019-08-31 DIAGNOSIS — J3089 Other allergic rhinitis: Secondary | ICD-10-CM

## 2019-08-31 NOTE — Progress Notes (Signed)
Location:    [place] Room Number: [id number] Place of Service:  SNF (31) Phillips Grout NP    CODE STATUS: DNR  No Known Allergies   Chief Complaint  Patient presents with  . Medical Management of Chronic Issues        Acute respiratory disease due to covid 19:  Essential hypertension: Chronic non-seasonal allergic rhinitis:   Weekly follow up for the first 30 days post hospitalization.      HPI:  He is a 86 year old short term rehab patient being seen for the management of his chronic illnesses: covid 91; hypertension; allergies. There are no reports of uncontrolled pain; no changes in his appetite; no reports of insomnia or anxiety. He continues to participate in therapy.   Past Medical History:  Diagnosis Date  . Hypercholesterolemia   . RETINAL DETACHMENT, BILATERAL, HX OF 05/31/2009   Qualifier: Diagnosis of  By: Tilden Dome      Past Surgical History:  Procedure Laterality Date  . EYE SURGERY    . FOOT SURGERY      Social History   Socioeconomic History  . Marital status: Married    Spouse name: Not on file  . Number of children: Not on file  . Years of education: Not on file  . Highest education level: Not on file  Occupational History  . Not on file  Tobacco Use  . Smoking status: Never Smoker  . Smokeless tobacco: Never Used  Substance and Sexual Activity  . Alcohol use: Not on file    Comment: occ  . Drug use: Never  . Sexual activity: Not on file  Other Topics Concern  . Not on file  Social History Narrative  . Not on file   Social Determinants of Health   Financial Resource Strain:   . Difficulty of Paying Living Expenses: Not on file  Food Insecurity:   . Worried About Charity fundraiser in the Last Year: Not on file  . Ran Out of Food in the Last Year: Not on file  Transportation Needs:   . Lack of Transportation (Medical): Not on file  . Lack of Transportation (Non-Medical): Not on file  Physical Activity:   .  Days of Exercise per Week: Not on file  . Minutes of Exercise per Session: Not on file  Stress:   . Feeling of Stress : Not on file  Social Connections:   . Frequency of Communication with Friends and Family: Not on file  . Frequency of Social Gatherings with Friends and Family: Not on file  . Attends Religious Services: Not on file  . Active Member of Clubs or Organizations: Not on file  . Attends Archivist Meetings: Not on file  . Marital Status: Not on file  Intimate Partner Violence:   . Fear of Current or Ex-Partner: Not on file  . Emotionally Abused: Not on file  . Physically Abused: Not on file  . Sexually Abused: Not on file   History reviewed. No pertinent family history.    VITAL SIGNS BP (!) 142/79   Pulse 84   Temp (!) 97.2 F (36.2 C) (Oral)   Resp 20   Ht 6\' 1"  (1.854 m)   Wt 215 lb 12.8 oz (97.9 kg)   SpO2 95%   BMI 28.47 kg/m   Outpatient Encounter Medications as of 08/31/2019  Medication Sig  . acetaminophen (TYLENOL 8 HOUR ARTHRITIS PAIN) 650 MG CR tablet Take 1,300  mg by mouth 2 (two) times daily.  Marland Kitchen albuterol (VENTOLIN HFA) 108 (90 Base) MCG/ACT inhaler Inhale 2 puffs into the lungs every 6 (six) hours as needed for wheezing or shortness of breath.   . cholecalciferol (VITAMIN D3) 25 MCG (1000 UT) tablet Take 1,000 Units by mouth daily.  Marland Kitchen escitalopram (LEXAPRO) 10 MG tablet Take 10 mg by mouth daily.  . fluticasone (FLONASE) 50 MCG/ACT nasal spray Place 2 sprays into both nostrils daily as needed for allergies or rhinitis.   Marland Kitchen guaiFENesin-dextromethorphan (ROBITUSSIN DM) 100-10 MG/5ML syrup Take 10 mLs by mouth every 4 (four) hours as needed for cough.  . latanoprost (XALATAN) 0.005 % ophthalmic solution Place 1 drop into both eyes at bedtime.   Marland Kitchen losartan (COZAAR) 50 MG tablet Take 50 mg by mouth daily.  . Multiple Vitamin (MULTIVITAMIN WITH MINERALS) TABS tablet Take 1 tablet by mouth daily.  . NON FORMULARY Diet: No Added Salt  .  Omeprazole-Sodium Bicarbonate (ZEGERID OTC) 20-1100 MG CAPS capsule Take 1 capsule by mouth daily before breakfast.  . polyethylene glycol powder (GLYCOLAX/MIRALAX) 17 GM/SCOOP powder Take 17 g by mouth daily as needed for mild constipation or moderate constipation.  . simvastatin (ZOCOR) 20 MG tablet Take 20 mg by mouth daily at 6 PM.   . zinc sulfate 220 (50 Zn) MG capsule Take 1 capsule (220 mg total) by mouth daily.  . [DISCONTINUED] losartan (COZAAR) 100 MG tablet Take 0.5 tablets (50 mg total) by mouth daily.   No facility-administered encounter medications on file as of 08/31/2019.     SIGNIFICANT DIAGNOSTIC EXAMS  PREVIOUS;   08-13-19: chest x-ray: Low lung volumes with streaky opacity in the left lung base, which could reflect atelectasis or infiltrate.  NO NEW EXAMS.   LABS REVIEWED PREVIOUS ;   08-13-19: wbc 8.4; hgb 12.7; hct 40.5; mcv 104.9 plt 165; glucose 86; bun 29; creat 1.53; k+ 4.1; na++ 135; ca 8.4; ast 55; albumin 3.6 blood culture: no growth; d-dimer: 2.56; ferritin 215 08-15-19: wbc 16.4; hgb 11.7; hct 36.1; mcv 101.7 plt 162; glucose 135; bun 44; creat 1.53; k+ 4.1; na++ 135; ca 8.3; ast 63; albumin 2.9; d-dimer 1.69; mag 2.2 ;phos 2.6 ferritin 398 08-18-19: wbc 14.7; hgb 12.2; hct 37.5 mcv 100.3 plt 204; glucose 131; bun 35; creat 1.22; k+ 4.4; an++ 136 ca 8.4; ast 52; alt 61; albumin 2.9; d-dimer 1.34 ferritin 516 phos 1.9; mag 2.2   NO NEW LABS.   Review of Systems  Constitutional: Negative for malaise/fatigue.  Respiratory: Negative for cough and shortness of breath.   Cardiovascular: Negative for chest pain, palpitations and leg swelling.  Gastrointestinal: Negative for abdominal pain, constipation and heartburn.  Musculoskeletal: Negative for back pain, joint pain and myalgias.  Skin: Negative.   Neurological: Negative for dizziness.  Psychiatric/Behavioral: The patient is not nervous/anxious.     Physical Exam Constitutional:      General: He is not in  acute distress.    Appearance: He is well-developed. He is obese. He is not diaphoretic.  Neck:     Thyroid: No thyromegaly.  Cardiovascular:     Rate and Rhythm: Normal rate and regular rhythm.     Heart sounds: Normal heart sounds.  Pulmonary:     Effort: Pulmonary effort is normal. No respiratory distress.     Breath sounds: Normal breath sounds.  Abdominal:     General: Bowel sounds are normal. There is no distension.     Palpations: Abdomen is soft.  Tenderness: There is no abdominal tenderness.  Musculoskeletal:        General: Normal range of motion.     Cervical back: Neck supple.     Right lower leg: No edema.     Left lower leg: No edema.  Lymphadenopathy:     Cervical: No cervical adenopathy.  Skin:    General: Skin is warm and dry.  Neurological:     Mental Status: He is alert and oriented to person, place, and time.  Psychiatric:        Mood and Affect: Mood normal.     ASSESSMENT/ PLAN:  TODAY  1. Acute respiratory disease due to covid 19: is presently stable is on room air; will continue albuterol 2 puffs every 6 hours as needed will continue to monitor  2. Essential hypertension: is stable b/p 142/79 will continue cozaar 50 mg daily   3. Chronic non-seasonal allergic rhinitis: is stable will continue flonase daily as needed  PREVIOUS  4. Facet degeneration of lumbar spine: has chronic back pain: is stable will continue tylenol 1300 mg twice daily   5. Hyperlipidemia unspecified hyperlipidemia type: is stable will continue zocor 20 mg daily   6. Physical deconditioning: has weakness from covid 19; will continue therapy as directed to improve upon his level of independence with his adls. Will monitor   7. Chronic constipation: is stable will continue miralax daily as needed  8. GERD without esophagitis: is stable will continue zegerid 20/100 mg daily   9. Major depression recurrent chronic is stable will continue lexapro 10 mg daily   10.  Increase intraocular pressure: bilateral is stable will continue xalatan to both eyes nightly          MD is aware of resident's narcotic use and is in agreement with current plan of care. We will attempt to wean resident as appropriate.  Ok Edwards NP [facility]  Contact [phone] Monday through Friday 8am- 5pm  After hours call [phone]

## 2019-09-07 ENCOUNTER — Encounter: Payer: Self-pay | Admitting: Internal Medicine

## 2019-09-07 ENCOUNTER — Non-Acute Institutional Stay (SKILLED_NURSING_FACILITY): Payer: Medicare Other | Admitting: Internal Medicine

## 2019-09-07 DIAGNOSIS — U071 COVID-19: Secondary | ICD-10-CM | POA: Diagnosis not present

## 2019-09-07 DIAGNOSIS — J9601 Acute respiratory failure with hypoxia: Secondary | ICD-10-CM | POA: Diagnosis not present

## 2019-09-07 DIAGNOSIS — K219 Gastro-esophageal reflux disease without esophagitis: Secondary | ICD-10-CM

## 2019-09-07 DIAGNOSIS — E785 Hyperlipidemia, unspecified: Secondary | ICD-10-CM

## 2019-09-07 DIAGNOSIS — N179 Acute kidney failure, unspecified: Secondary | ICD-10-CM | POA: Diagnosis not present

## 2019-09-07 DIAGNOSIS — J1282 Pneumonia due to coronavirus disease 2019: Secondary | ICD-10-CM

## 2019-09-07 DIAGNOSIS — I10 Essential (primary) hypertension: Secondary | ICD-10-CM | POA: Diagnosis not present

## 2019-09-07 DIAGNOSIS — N189 Chronic kidney disease, unspecified: Secondary | ICD-10-CM

## 2019-09-07 DIAGNOSIS — F339 Major depressive disorder, recurrent, unspecified: Secondary | ICD-10-CM

## 2019-09-07 MED ORDER — ZINC SULFATE 220 (50 ZN) MG PO CAPS: 220.0000 mg | ORAL_CAPSULE | ORAL | 0 refills | Status: DC

## 2019-09-07 MED ORDER — LOSARTAN POTASSIUM 50 MG PO TABS: 50.0000 mg | ORAL_TABLET | Freq: Every day | ORAL | 0 refills | Status: DC

## 2019-09-07 MED ORDER — LATANOPROST 0.005 % OP SOLN: 1.0000 [drp] | Freq: Every day | OPHTHALMIC | 0 refills | Status: DC

## 2019-09-07 MED ORDER — SIMVASTATIN 20 MG PO TABS: 20.0000 mg | ORAL_TABLET | Freq: Every day | ORAL | 0 refills | Status: DC

## 2019-09-07 MED ORDER — POLYETHYLENE GLYCOL 3350 17 GM/SCOOP PO POWD: 17.0000 g | Freq: Every day | ORAL | 0 refills | Status: DC | PRN

## 2019-09-07 MED ORDER — OMEPRAZOLE-SODIUM BICARBONATE 20-1100 MG PO CAPS: 1.0000 | ORAL_CAPSULE | Freq: Every day | ORAL | 0 refills | Status: DC

## 2019-09-07 MED ORDER — FLUTICASONE PROPIONATE 50 MCG/ACT NA SUSP: 2.0000 | Freq: Every day | NASAL | 0 refills | Status: DC | PRN

## 2019-09-07 MED ORDER — ALBUTEROL SULFATE HFA 108 (90 BASE) MCG/ACT IN AERS: 2.0000 | INHALATION_SPRAY | Freq: Four times a day (QID) | RESPIRATORY_TRACT | 0 refills | Status: DC | PRN

## 2019-09-07 MED ORDER — ESCITALOPRAM OXALATE 10 MG PO TABS: 10.0000 mg | ORAL_TABLET | Freq: Every day | ORAL | 0 refills | Status: DC

## 2019-09-07 NOTE — Progress Notes (Signed)
Location:  [place] Room Number: [id number] Place of Service:  SNF (31)  PCP: Dione Housekeeper, MD Patient Care Team: Dione Housekeeper, MD as PCP - General (Family Medicine)  Extended Emergency Contact Information Primary Emergency Contact: Blaschke,Delois Address: [address]          [place], [place] Phone: [phone] Mobile Phone: [phone] Relation: Spouse Secondary Emergency Contact: Marquavion, Zenk Mobile Phone: [phone] Relation: Niece  No Known Allergies  Chief Complaint  Patient presents with  . Discharge Note    Discharge from [facility] on 09/09/19    HPI:  86 y.o. male with hyperlipidemia, hypertension, asthma/COPD, who was brought to [facility] for weakness, difficulty ambulating, and dyspnea on exertion, and several falls at home over the prior several days.  Patient denied cough but admitted to fever.  While he has not been leaving his house he has family and friends over 15-hour masks when they are with him.  In the ED he was found to have a white count of 8.4, C-reactive protein 7.1, ferritin 215, chest x-ray was streaky opacity in the left base, O2 sats 90 to 95% but after prolonged conversation O2 saturation dropped to 88 to 90%.  Patient was admitted to [facility] from 1/3-11 where he was treated for acute respiratory failure with hypoxia due to COVID-19 pneumonia.  Patient was treated with remdesivir and Decadron and his inflammatory markers improved.  Patient did have acute kidney injury on chronic kidney disease stage III which improved with hydration.  Patient was admitted to skilled nursing facility for OT/PT and is now ready to be discharged to home.  Patient will need a wheelchair on discharge to home in order to be able to accomplishing his ADLs and for ambulation.  Patient is able to propel himself in his wheelchair.    Past Medical History:  Diagnosis Date  . Hypercholesterolemia   . RETINAL DETACHMENT,  BILATERAL, HX OF 05/31/2009   Qualifier: Diagnosis of  By: Tilden Dome      Past Surgical History:  Procedure Laterality Date  . EYE SURGERY    . FOOT SURGERY       reports that he has never smoked. He has never used smokeless tobacco. He reports that he does not use drugs. No history on file for alcohol. Social History   Socioeconomic History  . Marital status: Married    Spouse name: Not on file  . Number of children: Not on file  . Years of education: Not on file  . Highest education level: Not on file  Occupational History  . Not on file  Tobacco Use  . Smoking status: Never Smoker  . Smokeless tobacco: Never Used  Substance and Sexual Activity  . Alcohol use: Not on file    Comment: occ  . Drug use: Never  . Sexual activity: Not on file  Other Topics Concern  . Not on file  Social History Narrative  . Not on file   Social Determinants of Health   Financial Resource Strain:   . Difficulty of Paying Living Expenses: Not on file  Food Insecurity:   . Worried About Charity fundraiser in the Last Year: Not on file  . Ran Out of Food in the Last Year: Not on file  Transportation Needs:   . Lack of Transportation (Medical): Not on file  . Lack of Transportation (Non-Medical): Not on file  Physical Activity:   . Days of Exercise per Week: Not  on file  . Minutes of Exercise per Session: Not on file  Stress:   . Feeling of Stress : Not on file  Social Connections:   . Frequency of Communication with Friends and Family: Not on file  . Frequency of Social Gatherings with Friends and Family: Not on file  . Attends Religious Services: Not on file  . Active Member of Clubs or Organizations: Not on file  . Attends Archivist Meetings: Not on file  . Marital Status: Not on file  Intimate Partner Violence:   . Fear of Current or Ex-Partner: Not on file  . Emotionally Abused: Not on file  . Physically Abused: Not on file  . Sexually Abused: Not on file     Pertinent  Health Maintenance Due  Topic Date Due  . INFLUENZA VACCINE  03/12/2019  . PNA vac Low Risk Adult  Completed    Medications: Current Outpatient Medications on File Prior to Visit  Medication Sig Dispense Refill  . acetaminophen (TYLENOL 8 HOUR ARTHRITIS PAIN) 650 MG CR tablet Take 1,300 mg by mouth 2 (two) times daily.    . cholecalciferol (VITAMIN D3) 25 MCG (1000 UT) tablet Take 1,000 Units by mouth daily.    Marland Kitchen guaiFENesin-dextromethorphan (ROBITUSSIN DM) 100-10 MG/5ML syrup Take 10 mLs by mouth every 4 (four) hours as needed for cough.    . Multiple Vitamin (MULTIVITAMIN WITH MINERALS) TABS tablet Take 1 tablet by mouth daily.    . NON FORMULARY Diet: No Added Salt     No current facility-administered medications on file prior to visit.          Vitals:   09/07/19 1149  BP: (!) 97/55  Pulse: 73  Resp: 20  Temp: 98.9 F (37.2 C)  TempSrc: Oral  SpO2: 95%  Weight: 215 lb 12.8 oz (97.9 kg)  Height: 6\' 1"  (1.854 m)   Body mass index is 28.47 kg/m.  Physical Exam  GENERAL APPEARANCE: Alert, conversant. No acute distress.  HEENT: Unremarkable. RESPIRATORY: Breathing is even, unlabored. Lung sounds are clear   CARDIOVASCULAR: Heart RRR no murmurs, rubs or gallops. No peripheral edema.  GASTROINTESTINAL: Abdomen is soft, non-tender, not distended w/ normal bowel sounds.  NEUROLOGIC: Cranial nerves 2-12 grossly intact. Moves all extremities   Labs reviewed: Basic Metabolic Panel: Recent Labs    08/16/19 0630 08/16/19 0630 08/17/19 0420 08/18/19 0837 08/20/19 0549  NA 135  --  135 136  --   K 4.2  --  4.4 4.4  --   CL 105  --  103 105  --   CO2 23  --  23 26  --   GLUCOSE 149*  --  160* 131*  --   BUN 41*  --  41* 35*  --   CREATININE 1.39*   < > 1.32* 1.22 1.26*  CALCIUM 8.2*  --  8.4* 8.4*  --   MG 2.2  --  2.3 2.2  --   PHOS 2.6  --  2.3* 1.9*  --    < > = values in this interval not displayed.   No results found for:  [facility] Liver Function Tests: Recent Labs    08/16/19 0630 08/17/19 0420 08/18/19 0837  AST 65* 65* 52*  ALT 45* 59* 61*  ALKPHOS 55 50 56  BILITOT 0.6 0.6 0.4  PROT 6.8 6.6 6.6  ALBUMIN 2.9* 2.8* 2.9*   No results for input(s): LIPASE, AMYLASE in the last 8760 hours. No results for input(s): AMMONIA  in the last 8760 hours. CBC: Recent Labs    08/16/19 0630 08/17/19 0420 08/18/19 0837  WBC 17.9* 16.2* 14.7*  NEUTROABS 15.3* 13.4* 11.4*  HGB 11.8* 11.7* 12.2*  HCT 36.6* 37.7* 37.5*  MCV 101.7* 103.0* 100.3*  PLT 173 191 204   Lipid Recent Labs    08/13/19 1711  TRIG 89   Cardiac Enzymes: No results for input(s): CKTOTAL, CKMB, CKMBINDEX, TROPONINI in the last 8760 hours. BNP: Recent Labs    08/13/19 1533  BNP 62.0   CBG: No results for input(s): GLUCAP in the last 8760 hours.  Procedures and Imaging Studies During Stay: [facility] Chest Port 1 View  Result Date: 08/13/2019 CLINICAL DATA:  Cough, COVID-19 positive EXAM: PORTABLE CHEST 1 VIEW COMPARISON:  05/31/2009 FINDINGS: The heart size and mediastinal contours are within normal limits. Low lung volumes. Mild streaky opacity in the left lung base. No pleural effusion or pneumothorax. The visualized skeletal structures are unremarkable. IMPRESSION: Low lung volumes with streaky opacity in the left lung base, which could reflect atelectasis or infiltrate. Electronically Signed   By: Davina Poke D.O.   On: 08/13/2019 16:10    Assessment/Plan:   Acute respiratory failure with hypoxia (HCC)  Pneumonia due to COVID-19 virus  Essential hypertension  Acute kidney injury superimposed on chronic kidney disease (HCC)  Major depression, recurrent, chronic (HCC)  Hyperlipidemia, unspecified hyperlipidemia type  GERD without esophagitis   Patient is being discharged with the following home health services: OT/PT  Patient is being discharged with the following durable medical equipment: Standard wheelchair-needs  for completion of ADLs and patient can propel himself in a standard wheelchair  Patient has been advised to f/u with their PCP in 1-2 weeks to bring them up to date on their rehab stay.  Social services at facility was responsible for arranging this appointment.  Pt was provided with a 30 day supply of prescriptions for medications and refills must be obtained from their PCP.  For controlled substances, a more limited supply may be provided adequate until PCP appointment only.  Medications have been reconciled by me.  Time spent greater than 30 minutes;> 50% of time with patient was spent reviewing records, labs, tests and studies, counseling and developing plan of care  Hennie Duos MD

## 2019-12-12 DIAGNOSIS — I152 Hypertension secondary to endocrine disorders: Secondary | ICD-10-CM | POA: Diagnosis not present

## 2019-12-12 DIAGNOSIS — E1169 Type 2 diabetes mellitus with other specified complication: Secondary | ICD-10-CM | POA: Diagnosis not present

## 2019-12-12 DIAGNOSIS — J449 Chronic obstructive pulmonary disease, unspecified: Secondary | ICD-10-CM | POA: Diagnosis not present

## 2019-12-12 DIAGNOSIS — E1159 Type 2 diabetes mellitus with other circulatory complications: Secondary | ICD-10-CM | POA: Diagnosis not present

## 2019-12-12 DIAGNOSIS — E785 Hyperlipidemia, unspecified: Secondary | ICD-10-CM | POA: Diagnosis not present

## 2019-12-27 DIAGNOSIS — L84 Corns and callosities: Secondary | ICD-10-CM | POA: Diagnosis not present

## 2019-12-27 DIAGNOSIS — B351 Tinea unguium: Secondary | ICD-10-CM | POA: Diagnosis not present

## 2019-12-27 DIAGNOSIS — M79676 Pain in unspecified toe(s): Secondary | ICD-10-CM | POA: Diagnosis not present

## 2019-12-27 DIAGNOSIS — E1151 Type 2 diabetes mellitus with diabetic peripheral angiopathy without gangrene: Secondary | ICD-10-CM | POA: Diagnosis not present

## 2020-01-19 DIAGNOSIS — Z7951 Long term (current) use of inhaled steroids: Secondary | ICD-10-CM | POA: Diagnosis not present

## 2020-01-19 DIAGNOSIS — N1832 Chronic kidney disease, stage 3b: Secondary | ICD-10-CM | POA: Diagnosis not present

## 2020-01-19 DIAGNOSIS — K5904 Chronic idiopathic constipation: Secondary | ICD-10-CM | POA: Diagnosis not present

## 2020-01-19 DIAGNOSIS — Z7984 Long term (current) use of oral hypoglycemic drugs: Secondary | ICD-10-CM | POA: Diagnosis not present

## 2020-01-19 DIAGNOSIS — J449 Chronic obstructive pulmonary disease, unspecified: Secondary | ICD-10-CM | POA: Diagnosis not present

## 2020-01-19 DIAGNOSIS — I10 Essential (primary) hypertension: Secondary | ICD-10-CM | POA: Diagnosis not present

## 2020-01-19 DIAGNOSIS — Z8616 Personal history of COVID-19: Secondary | ICD-10-CM | POA: Diagnosis not present

## 2020-01-19 DIAGNOSIS — E1122 Type 2 diabetes mellitus with diabetic chronic kidney disease: Secondary | ICD-10-CM | POA: Diagnosis not present

## 2020-01-19 DIAGNOSIS — K2100 Gastro-esophageal reflux disease with esophagitis, without bleeding: Secondary | ICD-10-CM | POA: Diagnosis not present

## 2020-01-19 DIAGNOSIS — G8929 Other chronic pain: Secondary | ICD-10-CM | POA: Diagnosis not present

## 2020-01-19 DIAGNOSIS — M545 Low back pain: Secondary | ICD-10-CM | POA: Diagnosis not present

## 2020-01-19 DIAGNOSIS — E1169 Type 2 diabetes mellitus with other specified complication: Secondary | ICD-10-CM | POA: Diagnosis not present

## 2020-01-19 DIAGNOSIS — J301 Allergic rhinitis due to pollen: Secondary | ICD-10-CM | POA: Diagnosis not present

## 2020-01-19 DIAGNOSIS — E782 Mixed hyperlipidemia: Secondary | ICD-10-CM | POA: Diagnosis not present

## 2020-01-19 DIAGNOSIS — R269 Unspecified abnormalities of gait and mobility: Secondary | ICD-10-CM | POA: Diagnosis not present

## 2020-01-19 DIAGNOSIS — M1990 Unspecified osteoarthritis, unspecified site: Secondary | ICD-10-CM | POA: Diagnosis not present

## 2020-01-25 DIAGNOSIS — M1990 Unspecified osteoarthritis, unspecified site: Secondary | ICD-10-CM | POA: Diagnosis not present

## 2020-01-25 DIAGNOSIS — R269 Unspecified abnormalities of gait and mobility: Secondary | ICD-10-CM | POA: Diagnosis not present

## 2020-01-25 DIAGNOSIS — M545 Low back pain: Secondary | ICD-10-CM | POA: Diagnosis not present

## 2020-01-25 DIAGNOSIS — Z8616 Personal history of COVID-19: Secondary | ICD-10-CM | POA: Diagnosis not present

## 2020-01-25 DIAGNOSIS — E1122 Type 2 diabetes mellitus with diabetic chronic kidney disease: Secondary | ICD-10-CM | POA: Diagnosis not present

## 2020-01-25 DIAGNOSIS — Z7951 Long term (current) use of inhaled steroids: Secondary | ICD-10-CM | POA: Diagnosis not present

## 2020-01-25 DIAGNOSIS — K5904 Chronic idiopathic constipation: Secondary | ICD-10-CM | POA: Diagnosis not present

## 2020-01-25 DIAGNOSIS — E1169 Type 2 diabetes mellitus with other specified complication: Secondary | ICD-10-CM | POA: Diagnosis not present

## 2020-01-25 DIAGNOSIS — E782 Mixed hyperlipidemia: Secondary | ICD-10-CM | POA: Diagnosis not present

## 2020-01-25 DIAGNOSIS — I10 Essential (primary) hypertension: Secondary | ICD-10-CM | POA: Diagnosis not present

## 2020-01-25 DIAGNOSIS — J301 Allergic rhinitis due to pollen: Secondary | ICD-10-CM | POA: Diagnosis not present

## 2020-01-25 DIAGNOSIS — N1832 Chronic kidney disease, stage 3b: Secondary | ICD-10-CM | POA: Diagnosis not present

## 2020-01-25 DIAGNOSIS — J449 Chronic obstructive pulmonary disease, unspecified: Secondary | ICD-10-CM | POA: Diagnosis not present

## 2020-01-25 DIAGNOSIS — K2100 Gastro-esophageal reflux disease with esophagitis, without bleeding: Secondary | ICD-10-CM | POA: Diagnosis not present

## 2020-01-25 DIAGNOSIS — G8929 Other chronic pain: Secondary | ICD-10-CM | POA: Diagnosis not present

## 2020-01-25 DIAGNOSIS — Z7984 Long term (current) use of oral hypoglycemic drugs: Secondary | ICD-10-CM | POA: Diagnosis not present

## 2020-01-27 DIAGNOSIS — J301 Allergic rhinitis due to pollen: Secondary | ICD-10-CM | POA: Diagnosis not present

## 2020-01-27 DIAGNOSIS — E1169 Type 2 diabetes mellitus with other specified complication: Secondary | ICD-10-CM | POA: Diagnosis not present

## 2020-01-27 DIAGNOSIS — K2100 Gastro-esophageal reflux disease with esophagitis, without bleeding: Secondary | ICD-10-CM | POA: Diagnosis not present

## 2020-01-27 DIAGNOSIS — E1122 Type 2 diabetes mellitus with diabetic chronic kidney disease: Secondary | ICD-10-CM | POA: Diagnosis not present

## 2020-01-27 DIAGNOSIS — M1990 Unspecified osteoarthritis, unspecified site: Secondary | ICD-10-CM | POA: Diagnosis not present

## 2020-01-27 DIAGNOSIS — N1832 Chronic kidney disease, stage 3b: Secondary | ICD-10-CM | POA: Diagnosis not present

## 2020-01-27 DIAGNOSIS — I10 Essential (primary) hypertension: Secondary | ICD-10-CM | POA: Diagnosis not present

## 2020-01-27 DIAGNOSIS — Z7951 Long term (current) use of inhaled steroids: Secondary | ICD-10-CM | POA: Diagnosis not present

## 2020-01-27 DIAGNOSIS — E782 Mixed hyperlipidemia: Secondary | ICD-10-CM | POA: Diagnosis not present

## 2020-01-27 DIAGNOSIS — Z8616 Personal history of COVID-19: Secondary | ICD-10-CM | POA: Diagnosis not present

## 2020-01-27 DIAGNOSIS — Z7984 Long term (current) use of oral hypoglycemic drugs: Secondary | ICD-10-CM | POA: Diagnosis not present

## 2020-01-27 DIAGNOSIS — M545 Low back pain: Secondary | ICD-10-CM | POA: Diagnosis not present

## 2020-01-27 DIAGNOSIS — G8929 Other chronic pain: Secondary | ICD-10-CM | POA: Diagnosis not present

## 2020-01-27 DIAGNOSIS — J449 Chronic obstructive pulmonary disease, unspecified: Secondary | ICD-10-CM | POA: Diagnosis not present

## 2020-01-27 DIAGNOSIS — K5904 Chronic idiopathic constipation: Secondary | ICD-10-CM | POA: Diagnosis not present

## 2020-01-27 DIAGNOSIS — R269 Unspecified abnormalities of gait and mobility: Secondary | ICD-10-CM | POA: Diagnosis not present

## 2020-01-30 DIAGNOSIS — J449 Chronic obstructive pulmonary disease, unspecified: Secondary | ICD-10-CM | POA: Diagnosis not present

## 2020-01-30 DIAGNOSIS — M1990 Unspecified osteoarthritis, unspecified site: Secondary | ICD-10-CM | POA: Diagnosis not present

## 2020-01-30 DIAGNOSIS — E782 Mixed hyperlipidemia: Secondary | ICD-10-CM | POA: Diagnosis not present

## 2020-01-30 DIAGNOSIS — E1169 Type 2 diabetes mellitus with other specified complication: Secondary | ICD-10-CM | POA: Diagnosis not present

## 2020-01-30 DIAGNOSIS — K2100 Gastro-esophageal reflux disease with esophagitis, without bleeding: Secondary | ICD-10-CM | POA: Diagnosis not present

## 2020-01-30 DIAGNOSIS — J301 Allergic rhinitis due to pollen: Secondary | ICD-10-CM | POA: Diagnosis not present

## 2020-01-30 DIAGNOSIS — Z7951 Long term (current) use of inhaled steroids: Secondary | ICD-10-CM | POA: Diagnosis not present

## 2020-01-30 DIAGNOSIS — Z7984 Long term (current) use of oral hypoglycemic drugs: Secondary | ICD-10-CM | POA: Diagnosis not present

## 2020-01-30 DIAGNOSIS — E1122 Type 2 diabetes mellitus with diabetic chronic kidney disease: Secondary | ICD-10-CM | POA: Diagnosis not present

## 2020-01-30 DIAGNOSIS — R269 Unspecified abnormalities of gait and mobility: Secondary | ICD-10-CM | POA: Diagnosis not present

## 2020-01-30 DIAGNOSIS — M545 Low back pain: Secondary | ICD-10-CM | POA: Diagnosis not present

## 2020-01-30 DIAGNOSIS — Z8616 Personal history of COVID-19: Secondary | ICD-10-CM | POA: Diagnosis not present

## 2020-01-30 DIAGNOSIS — G8929 Other chronic pain: Secondary | ICD-10-CM | POA: Diagnosis not present

## 2020-01-30 DIAGNOSIS — N1832 Chronic kidney disease, stage 3b: Secondary | ICD-10-CM | POA: Diagnosis not present

## 2020-01-30 DIAGNOSIS — K5904 Chronic idiopathic constipation: Secondary | ICD-10-CM | POA: Diagnosis not present

## 2020-01-30 DIAGNOSIS — I10 Essential (primary) hypertension: Secondary | ICD-10-CM | POA: Diagnosis not present

## 2020-01-31 DIAGNOSIS — J301 Allergic rhinitis due to pollen: Secondary | ICD-10-CM | POA: Diagnosis not present

## 2020-01-31 DIAGNOSIS — G8929 Other chronic pain: Secondary | ICD-10-CM | POA: Diagnosis not present

## 2020-01-31 DIAGNOSIS — I10 Essential (primary) hypertension: Secondary | ICD-10-CM | POA: Diagnosis not present

## 2020-01-31 DIAGNOSIS — Z7951 Long term (current) use of inhaled steroids: Secondary | ICD-10-CM | POA: Diagnosis not present

## 2020-01-31 DIAGNOSIS — K2100 Gastro-esophageal reflux disease with esophagitis, without bleeding: Secondary | ICD-10-CM | POA: Diagnosis not present

## 2020-01-31 DIAGNOSIS — E782 Mixed hyperlipidemia: Secondary | ICD-10-CM | POA: Diagnosis not present

## 2020-01-31 DIAGNOSIS — R269 Unspecified abnormalities of gait and mobility: Secondary | ICD-10-CM | POA: Diagnosis not present

## 2020-01-31 DIAGNOSIS — M1990 Unspecified osteoarthritis, unspecified site: Secondary | ICD-10-CM | POA: Diagnosis not present

## 2020-01-31 DIAGNOSIS — E1169 Type 2 diabetes mellitus with other specified complication: Secondary | ICD-10-CM | POA: Diagnosis not present

## 2020-01-31 DIAGNOSIS — E1122 Type 2 diabetes mellitus with diabetic chronic kidney disease: Secondary | ICD-10-CM | POA: Diagnosis not present

## 2020-01-31 DIAGNOSIS — N1832 Chronic kidney disease, stage 3b: Secondary | ICD-10-CM | POA: Diagnosis not present

## 2020-01-31 DIAGNOSIS — Z7984 Long term (current) use of oral hypoglycemic drugs: Secondary | ICD-10-CM | POA: Diagnosis not present

## 2020-01-31 DIAGNOSIS — K5904 Chronic idiopathic constipation: Secondary | ICD-10-CM | POA: Diagnosis not present

## 2020-01-31 DIAGNOSIS — J449 Chronic obstructive pulmonary disease, unspecified: Secondary | ICD-10-CM | POA: Diagnosis not present

## 2020-01-31 DIAGNOSIS — M545 Low back pain: Secondary | ICD-10-CM | POA: Diagnosis not present

## 2020-01-31 DIAGNOSIS — Z8616 Personal history of COVID-19: Secondary | ICD-10-CM | POA: Diagnosis not present

## 2020-02-01 DIAGNOSIS — E1169 Type 2 diabetes mellitus with other specified complication: Secondary | ICD-10-CM | POA: Diagnosis not present

## 2020-02-01 DIAGNOSIS — E782 Mixed hyperlipidemia: Secondary | ICD-10-CM | POA: Diagnosis not present

## 2020-02-01 DIAGNOSIS — Z8616 Personal history of COVID-19: Secondary | ICD-10-CM | POA: Diagnosis not present

## 2020-02-01 DIAGNOSIS — J301 Allergic rhinitis due to pollen: Secondary | ICD-10-CM | POA: Diagnosis not present

## 2020-02-01 DIAGNOSIS — Z7951 Long term (current) use of inhaled steroids: Secondary | ICD-10-CM | POA: Diagnosis not present

## 2020-02-01 DIAGNOSIS — J449 Chronic obstructive pulmonary disease, unspecified: Secondary | ICD-10-CM | POA: Diagnosis not present

## 2020-02-01 DIAGNOSIS — K2100 Gastro-esophageal reflux disease with esophagitis, without bleeding: Secondary | ICD-10-CM | POA: Diagnosis not present

## 2020-02-01 DIAGNOSIS — M545 Low back pain: Secondary | ICD-10-CM | POA: Diagnosis not present

## 2020-02-01 DIAGNOSIS — E1122 Type 2 diabetes mellitus with diabetic chronic kidney disease: Secondary | ICD-10-CM | POA: Diagnosis not present

## 2020-02-01 DIAGNOSIS — I10 Essential (primary) hypertension: Secondary | ICD-10-CM | POA: Diagnosis not present

## 2020-02-01 DIAGNOSIS — R269 Unspecified abnormalities of gait and mobility: Secondary | ICD-10-CM | POA: Diagnosis not present

## 2020-02-01 DIAGNOSIS — K5904 Chronic idiopathic constipation: Secondary | ICD-10-CM | POA: Diagnosis not present

## 2020-02-01 DIAGNOSIS — G8929 Other chronic pain: Secondary | ICD-10-CM | POA: Diagnosis not present

## 2020-02-01 DIAGNOSIS — Z7984 Long term (current) use of oral hypoglycemic drugs: Secondary | ICD-10-CM | POA: Diagnosis not present

## 2020-02-01 DIAGNOSIS — M1990 Unspecified osteoarthritis, unspecified site: Secondary | ICD-10-CM | POA: Diagnosis not present

## 2020-02-01 DIAGNOSIS — N1832 Chronic kidney disease, stage 3b: Secondary | ICD-10-CM | POA: Diagnosis not present

## 2020-02-02 DIAGNOSIS — E1169 Type 2 diabetes mellitus with other specified complication: Secondary | ICD-10-CM | POA: Diagnosis not present

## 2020-02-02 DIAGNOSIS — G8929 Other chronic pain: Secondary | ICD-10-CM | POA: Diagnosis not present

## 2020-02-02 DIAGNOSIS — E782 Mixed hyperlipidemia: Secondary | ICD-10-CM | POA: Diagnosis not present

## 2020-02-02 DIAGNOSIS — E1122 Type 2 diabetes mellitus with diabetic chronic kidney disease: Secondary | ICD-10-CM | POA: Diagnosis not present

## 2020-02-02 DIAGNOSIS — M1990 Unspecified osteoarthritis, unspecified site: Secondary | ICD-10-CM | POA: Diagnosis not present

## 2020-02-02 DIAGNOSIS — R269 Unspecified abnormalities of gait and mobility: Secondary | ICD-10-CM | POA: Diagnosis not present

## 2020-02-02 DIAGNOSIS — Z7951 Long term (current) use of inhaled steroids: Secondary | ICD-10-CM | POA: Diagnosis not present

## 2020-02-02 DIAGNOSIS — Z8616 Personal history of COVID-19: Secondary | ICD-10-CM | POA: Diagnosis not present

## 2020-02-02 DIAGNOSIS — K5904 Chronic idiopathic constipation: Secondary | ICD-10-CM | POA: Diagnosis not present

## 2020-02-02 DIAGNOSIS — I10 Essential (primary) hypertension: Secondary | ICD-10-CM | POA: Diagnosis not present

## 2020-02-02 DIAGNOSIS — M545 Low back pain: Secondary | ICD-10-CM | POA: Diagnosis not present

## 2020-02-02 DIAGNOSIS — J449 Chronic obstructive pulmonary disease, unspecified: Secondary | ICD-10-CM | POA: Diagnosis not present

## 2020-02-02 DIAGNOSIS — J301 Allergic rhinitis due to pollen: Secondary | ICD-10-CM | POA: Diagnosis not present

## 2020-02-02 DIAGNOSIS — Z7984 Long term (current) use of oral hypoglycemic drugs: Secondary | ICD-10-CM | POA: Diagnosis not present

## 2020-02-02 DIAGNOSIS — N1832 Chronic kidney disease, stage 3b: Secondary | ICD-10-CM | POA: Diagnosis not present

## 2020-02-02 DIAGNOSIS — K2100 Gastro-esophageal reflux disease with esophagitis, without bleeding: Secondary | ICD-10-CM | POA: Diagnosis not present

## 2020-02-03 DIAGNOSIS — M1990 Unspecified osteoarthritis, unspecified site: Secondary | ICD-10-CM | POA: Diagnosis not present

## 2020-02-03 DIAGNOSIS — R269 Unspecified abnormalities of gait and mobility: Secondary | ICD-10-CM | POA: Diagnosis not present

## 2020-02-03 DIAGNOSIS — K2100 Gastro-esophageal reflux disease with esophagitis, without bleeding: Secondary | ICD-10-CM | POA: Diagnosis not present

## 2020-02-03 DIAGNOSIS — Z7984 Long term (current) use of oral hypoglycemic drugs: Secondary | ICD-10-CM | POA: Diagnosis not present

## 2020-02-03 DIAGNOSIS — E782 Mixed hyperlipidemia: Secondary | ICD-10-CM | POA: Diagnosis not present

## 2020-02-03 DIAGNOSIS — Z8616 Personal history of COVID-19: Secondary | ICD-10-CM | POA: Diagnosis not present

## 2020-02-03 DIAGNOSIS — E1122 Type 2 diabetes mellitus with diabetic chronic kidney disease: Secondary | ICD-10-CM | POA: Diagnosis not present

## 2020-02-03 DIAGNOSIS — Z7951 Long term (current) use of inhaled steroids: Secondary | ICD-10-CM | POA: Diagnosis not present

## 2020-02-03 DIAGNOSIS — J301 Allergic rhinitis due to pollen: Secondary | ICD-10-CM | POA: Diagnosis not present

## 2020-02-03 DIAGNOSIS — K5904 Chronic idiopathic constipation: Secondary | ICD-10-CM | POA: Diagnosis not present

## 2020-02-03 DIAGNOSIS — G8929 Other chronic pain: Secondary | ICD-10-CM | POA: Diagnosis not present

## 2020-02-03 DIAGNOSIS — J449 Chronic obstructive pulmonary disease, unspecified: Secondary | ICD-10-CM | POA: Diagnosis not present

## 2020-02-03 DIAGNOSIS — E1169 Type 2 diabetes mellitus with other specified complication: Secondary | ICD-10-CM | POA: Diagnosis not present

## 2020-02-03 DIAGNOSIS — M545 Low back pain: Secondary | ICD-10-CM | POA: Diagnosis not present

## 2020-02-03 DIAGNOSIS — I10 Essential (primary) hypertension: Secondary | ICD-10-CM | POA: Diagnosis not present

## 2020-02-03 DIAGNOSIS — N1832 Chronic kidney disease, stage 3b: Secondary | ICD-10-CM | POA: Diagnosis not present

## 2020-02-06 DIAGNOSIS — N1832 Chronic kidney disease, stage 3b: Secondary | ICD-10-CM | POA: Diagnosis not present

## 2020-02-06 DIAGNOSIS — E782 Mixed hyperlipidemia: Secondary | ICD-10-CM | POA: Diagnosis not present

## 2020-02-06 DIAGNOSIS — I10 Essential (primary) hypertension: Secondary | ICD-10-CM | POA: Diagnosis not present

## 2020-02-06 DIAGNOSIS — E1169 Type 2 diabetes mellitus with other specified complication: Secondary | ICD-10-CM | POA: Diagnosis not present

## 2020-02-06 DIAGNOSIS — M1990 Unspecified osteoarthritis, unspecified site: Secondary | ICD-10-CM | POA: Diagnosis not present

## 2020-02-06 DIAGNOSIS — K5904 Chronic idiopathic constipation: Secondary | ICD-10-CM | POA: Diagnosis not present

## 2020-02-06 DIAGNOSIS — J301 Allergic rhinitis due to pollen: Secondary | ICD-10-CM | POA: Diagnosis not present

## 2020-02-06 DIAGNOSIS — K2100 Gastro-esophageal reflux disease with esophagitis, without bleeding: Secondary | ICD-10-CM | POA: Diagnosis not present

## 2020-02-06 DIAGNOSIS — R269 Unspecified abnormalities of gait and mobility: Secondary | ICD-10-CM | POA: Diagnosis not present

## 2020-02-06 DIAGNOSIS — E1122 Type 2 diabetes mellitus with diabetic chronic kidney disease: Secondary | ICD-10-CM | POA: Diagnosis not present

## 2020-02-06 DIAGNOSIS — J449 Chronic obstructive pulmonary disease, unspecified: Secondary | ICD-10-CM | POA: Diagnosis not present

## 2020-02-06 DIAGNOSIS — Z7951 Long term (current) use of inhaled steroids: Secondary | ICD-10-CM | POA: Diagnosis not present

## 2020-02-06 DIAGNOSIS — G8929 Other chronic pain: Secondary | ICD-10-CM | POA: Diagnosis not present

## 2020-02-06 DIAGNOSIS — M545 Low back pain: Secondary | ICD-10-CM | POA: Diagnosis not present

## 2020-02-06 DIAGNOSIS — Z8616 Personal history of COVID-19: Secondary | ICD-10-CM | POA: Diagnosis not present

## 2020-02-06 DIAGNOSIS — Z7984 Long term (current) use of oral hypoglycemic drugs: Secondary | ICD-10-CM | POA: Diagnosis not present

## 2020-02-07 DIAGNOSIS — Z7984 Long term (current) use of oral hypoglycemic drugs: Secondary | ICD-10-CM | POA: Diagnosis not present

## 2020-02-07 DIAGNOSIS — E1169 Type 2 diabetes mellitus with other specified complication: Secondary | ICD-10-CM | POA: Diagnosis not present

## 2020-02-07 DIAGNOSIS — E782 Mixed hyperlipidemia: Secondary | ICD-10-CM | POA: Diagnosis not present

## 2020-02-07 DIAGNOSIS — J449 Chronic obstructive pulmonary disease, unspecified: Secondary | ICD-10-CM | POA: Diagnosis not present

## 2020-02-07 DIAGNOSIS — J301 Allergic rhinitis due to pollen: Secondary | ICD-10-CM | POA: Diagnosis not present

## 2020-02-07 DIAGNOSIS — R269 Unspecified abnormalities of gait and mobility: Secondary | ICD-10-CM | POA: Diagnosis not present

## 2020-02-07 DIAGNOSIS — K2100 Gastro-esophageal reflux disease with esophagitis, without bleeding: Secondary | ICD-10-CM | POA: Diagnosis not present

## 2020-02-07 DIAGNOSIS — G8929 Other chronic pain: Secondary | ICD-10-CM | POA: Diagnosis not present

## 2020-02-07 DIAGNOSIS — Z8616 Personal history of COVID-19: Secondary | ICD-10-CM | POA: Diagnosis not present

## 2020-02-07 DIAGNOSIS — I10 Essential (primary) hypertension: Secondary | ICD-10-CM | POA: Diagnosis not present

## 2020-02-07 DIAGNOSIS — K5904 Chronic idiopathic constipation: Secondary | ICD-10-CM | POA: Diagnosis not present

## 2020-02-07 DIAGNOSIS — M545 Low back pain: Secondary | ICD-10-CM | POA: Diagnosis not present

## 2020-02-07 DIAGNOSIS — N1832 Chronic kidney disease, stage 3b: Secondary | ICD-10-CM | POA: Diagnosis not present

## 2020-02-07 DIAGNOSIS — Z7951 Long term (current) use of inhaled steroids: Secondary | ICD-10-CM | POA: Diagnosis not present

## 2020-02-07 DIAGNOSIS — M1990 Unspecified osteoarthritis, unspecified site: Secondary | ICD-10-CM | POA: Diagnosis not present

## 2020-02-07 DIAGNOSIS — E1122 Type 2 diabetes mellitus with diabetic chronic kidney disease: Secondary | ICD-10-CM | POA: Diagnosis not present

## 2020-02-08 DIAGNOSIS — I10 Essential (primary) hypertension: Secondary | ICD-10-CM | POA: Diagnosis not present

## 2020-02-08 DIAGNOSIS — E1122 Type 2 diabetes mellitus with diabetic chronic kidney disease: Secondary | ICD-10-CM | POA: Diagnosis not present

## 2020-02-08 DIAGNOSIS — Z7951 Long term (current) use of inhaled steroids: Secondary | ICD-10-CM | POA: Diagnosis not present

## 2020-02-08 DIAGNOSIS — E782 Mixed hyperlipidemia: Secondary | ICD-10-CM | POA: Diagnosis not present

## 2020-02-08 DIAGNOSIS — Z7984 Long term (current) use of oral hypoglycemic drugs: Secondary | ICD-10-CM | POA: Diagnosis not present

## 2020-02-08 DIAGNOSIS — K5904 Chronic idiopathic constipation: Secondary | ICD-10-CM | POA: Diagnosis not present

## 2020-02-08 DIAGNOSIS — N1832 Chronic kidney disease, stage 3b: Secondary | ICD-10-CM | POA: Diagnosis not present

## 2020-02-08 DIAGNOSIS — Z8616 Personal history of COVID-19: Secondary | ICD-10-CM | POA: Diagnosis not present

## 2020-02-08 DIAGNOSIS — G8929 Other chronic pain: Secondary | ICD-10-CM | POA: Diagnosis not present

## 2020-02-08 DIAGNOSIS — E1169 Type 2 diabetes mellitus with other specified complication: Secondary | ICD-10-CM | POA: Diagnosis not present

## 2020-02-08 DIAGNOSIS — R269 Unspecified abnormalities of gait and mobility: Secondary | ICD-10-CM | POA: Diagnosis not present

## 2020-02-08 DIAGNOSIS — M1990 Unspecified osteoarthritis, unspecified site: Secondary | ICD-10-CM | POA: Diagnosis not present

## 2020-02-08 DIAGNOSIS — J301 Allergic rhinitis due to pollen: Secondary | ICD-10-CM | POA: Diagnosis not present

## 2020-02-08 DIAGNOSIS — M545 Low back pain: Secondary | ICD-10-CM | POA: Diagnosis not present

## 2020-02-08 DIAGNOSIS — J449 Chronic obstructive pulmonary disease, unspecified: Secondary | ICD-10-CM | POA: Diagnosis not present

## 2020-02-08 DIAGNOSIS — K2100 Gastro-esophageal reflux disease with esophagitis, without bleeding: Secondary | ICD-10-CM | POA: Diagnosis not present

## 2020-02-09 DIAGNOSIS — M545 Low back pain: Secondary | ICD-10-CM | POA: Diagnosis not present

## 2020-02-09 DIAGNOSIS — Z7984 Long term (current) use of oral hypoglycemic drugs: Secondary | ICD-10-CM | POA: Diagnosis not present

## 2020-02-09 DIAGNOSIS — E1169 Type 2 diabetes mellitus with other specified complication: Secondary | ICD-10-CM | POA: Diagnosis not present

## 2020-02-09 DIAGNOSIS — E1122 Type 2 diabetes mellitus with diabetic chronic kidney disease: Secondary | ICD-10-CM | POA: Diagnosis not present

## 2020-02-09 DIAGNOSIS — E782 Mixed hyperlipidemia: Secondary | ICD-10-CM | POA: Diagnosis not present

## 2020-02-09 DIAGNOSIS — J449 Chronic obstructive pulmonary disease, unspecified: Secondary | ICD-10-CM | POA: Diagnosis not present

## 2020-02-09 DIAGNOSIS — K5904 Chronic idiopathic constipation: Secondary | ICD-10-CM | POA: Diagnosis not present

## 2020-02-09 DIAGNOSIS — Z8616 Personal history of COVID-19: Secondary | ICD-10-CM | POA: Diagnosis not present

## 2020-02-09 DIAGNOSIS — N1832 Chronic kidney disease, stage 3b: Secondary | ICD-10-CM | POA: Diagnosis not present

## 2020-02-09 DIAGNOSIS — M1990 Unspecified osteoarthritis, unspecified site: Secondary | ICD-10-CM | POA: Diagnosis not present

## 2020-02-09 DIAGNOSIS — I10 Essential (primary) hypertension: Secondary | ICD-10-CM | POA: Diagnosis not present

## 2020-02-09 DIAGNOSIS — G8929 Other chronic pain: Secondary | ICD-10-CM | POA: Diagnosis not present

## 2020-02-09 DIAGNOSIS — R269 Unspecified abnormalities of gait and mobility: Secondary | ICD-10-CM | POA: Diagnosis not present

## 2020-02-09 DIAGNOSIS — J301 Allergic rhinitis due to pollen: Secondary | ICD-10-CM | POA: Diagnosis not present

## 2020-02-09 DIAGNOSIS — Z7951 Long term (current) use of inhaled steroids: Secondary | ICD-10-CM | POA: Diagnosis not present

## 2020-02-09 DIAGNOSIS — K2100 Gastro-esophageal reflux disease with esophagitis, without bleeding: Secondary | ICD-10-CM | POA: Diagnosis not present

## 2020-02-14 DIAGNOSIS — K2100 Gastro-esophageal reflux disease with esophagitis, without bleeding: Secondary | ICD-10-CM | POA: Diagnosis not present

## 2020-02-14 DIAGNOSIS — Z7984 Long term (current) use of oral hypoglycemic drugs: Secondary | ICD-10-CM | POA: Diagnosis not present

## 2020-02-14 DIAGNOSIS — Z8616 Personal history of COVID-19: Secondary | ICD-10-CM | POA: Diagnosis not present

## 2020-02-14 DIAGNOSIS — E782 Mixed hyperlipidemia: Secondary | ICD-10-CM | POA: Diagnosis not present

## 2020-02-14 DIAGNOSIS — I10 Essential (primary) hypertension: Secondary | ICD-10-CM | POA: Diagnosis not present

## 2020-02-14 DIAGNOSIS — J449 Chronic obstructive pulmonary disease, unspecified: Secondary | ICD-10-CM | POA: Diagnosis not present

## 2020-02-14 DIAGNOSIS — G8929 Other chronic pain: Secondary | ICD-10-CM | POA: Diagnosis not present

## 2020-02-14 DIAGNOSIS — Z7951 Long term (current) use of inhaled steroids: Secondary | ICD-10-CM | POA: Diagnosis not present

## 2020-02-14 DIAGNOSIS — E1122 Type 2 diabetes mellitus with diabetic chronic kidney disease: Secondary | ICD-10-CM | POA: Diagnosis not present

## 2020-02-14 DIAGNOSIS — K5904 Chronic idiopathic constipation: Secondary | ICD-10-CM | POA: Diagnosis not present

## 2020-02-14 DIAGNOSIS — E1169 Type 2 diabetes mellitus with other specified complication: Secondary | ICD-10-CM | POA: Diagnosis not present

## 2020-02-14 DIAGNOSIS — N1832 Chronic kidney disease, stage 3b: Secondary | ICD-10-CM | POA: Diagnosis not present

## 2020-02-14 DIAGNOSIS — M545 Low back pain: Secondary | ICD-10-CM | POA: Diagnosis not present

## 2020-02-14 DIAGNOSIS — R269 Unspecified abnormalities of gait and mobility: Secondary | ICD-10-CM | POA: Diagnosis not present

## 2020-02-14 DIAGNOSIS — J301 Allergic rhinitis due to pollen: Secondary | ICD-10-CM | POA: Diagnosis not present

## 2020-02-14 DIAGNOSIS — M1990 Unspecified osteoarthritis, unspecified site: Secondary | ICD-10-CM | POA: Diagnosis not present

## 2020-02-16 DIAGNOSIS — Z8616 Personal history of COVID-19: Secondary | ICD-10-CM | POA: Diagnosis not present

## 2020-02-16 DIAGNOSIS — N1832 Chronic kidney disease, stage 3b: Secondary | ICD-10-CM | POA: Diagnosis not present

## 2020-02-16 DIAGNOSIS — G8929 Other chronic pain: Secondary | ICD-10-CM | POA: Diagnosis not present

## 2020-02-16 DIAGNOSIS — R269 Unspecified abnormalities of gait and mobility: Secondary | ICD-10-CM | POA: Diagnosis not present

## 2020-02-16 DIAGNOSIS — Z7951 Long term (current) use of inhaled steroids: Secondary | ICD-10-CM | POA: Diagnosis not present

## 2020-02-16 DIAGNOSIS — Z7984 Long term (current) use of oral hypoglycemic drugs: Secondary | ICD-10-CM | POA: Diagnosis not present

## 2020-02-16 DIAGNOSIS — E1122 Type 2 diabetes mellitus with diabetic chronic kidney disease: Secondary | ICD-10-CM | POA: Diagnosis not present

## 2020-02-16 DIAGNOSIS — E782 Mixed hyperlipidemia: Secondary | ICD-10-CM | POA: Diagnosis not present

## 2020-02-16 DIAGNOSIS — J449 Chronic obstructive pulmonary disease, unspecified: Secondary | ICD-10-CM | POA: Diagnosis not present

## 2020-02-16 DIAGNOSIS — M1990 Unspecified osteoarthritis, unspecified site: Secondary | ICD-10-CM | POA: Diagnosis not present

## 2020-02-16 DIAGNOSIS — M545 Low back pain: Secondary | ICD-10-CM | POA: Diagnosis not present

## 2020-02-16 DIAGNOSIS — I10 Essential (primary) hypertension: Secondary | ICD-10-CM | POA: Diagnosis not present

## 2020-02-16 DIAGNOSIS — K5904 Chronic idiopathic constipation: Secondary | ICD-10-CM | POA: Diagnosis not present

## 2020-02-16 DIAGNOSIS — E1169 Type 2 diabetes mellitus with other specified complication: Secondary | ICD-10-CM | POA: Diagnosis not present

## 2020-02-16 DIAGNOSIS — K2100 Gastro-esophageal reflux disease with esophagitis, without bleeding: Secondary | ICD-10-CM | POA: Diagnosis not present

## 2020-02-16 DIAGNOSIS — J301 Allergic rhinitis due to pollen: Secondary | ICD-10-CM | POA: Diagnosis not present

## 2020-02-17 DIAGNOSIS — Z7984 Long term (current) use of oral hypoglycemic drugs: Secondary | ICD-10-CM | POA: Diagnosis not present

## 2020-02-17 DIAGNOSIS — E1122 Type 2 diabetes mellitus with diabetic chronic kidney disease: Secondary | ICD-10-CM | POA: Diagnosis not present

## 2020-02-17 DIAGNOSIS — K2100 Gastro-esophageal reflux disease with esophagitis, without bleeding: Secondary | ICD-10-CM | POA: Diagnosis not present

## 2020-02-17 DIAGNOSIS — N1832 Chronic kidney disease, stage 3b: Secondary | ICD-10-CM | POA: Diagnosis not present

## 2020-02-17 DIAGNOSIS — K5904 Chronic idiopathic constipation: Secondary | ICD-10-CM | POA: Diagnosis not present

## 2020-02-17 DIAGNOSIS — E1169 Type 2 diabetes mellitus with other specified complication: Secondary | ICD-10-CM | POA: Diagnosis not present

## 2020-02-17 DIAGNOSIS — R269 Unspecified abnormalities of gait and mobility: Secondary | ICD-10-CM | POA: Diagnosis not present

## 2020-02-17 DIAGNOSIS — M1990 Unspecified osteoarthritis, unspecified site: Secondary | ICD-10-CM | POA: Diagnosis not present

## 2020-02-17 DIAGNOSIS — E782 Mixed hyperlipidemia: Secondary | ICD-10-CM | POA: Diagnosis not present

## 2020-02-17 DIAGNOSIS — G8929 Other chronic pain: Secondary | ICD-10-CM | POA: Diagnosis not present

## 2020-02-17 DIAGNOSIS — M545 Low back pain: Secondary | ICD-10-CM | POA: Diagnosis not present

## 2020-02-17 DIAGNOSIS — Z7951 Long term (current) use of inhaled steroids: Secondary | ICD-10-CM | POA: Diagnosis not present

## 2020-02-17 DIAGNOSIS — Z8616 Personal history of COVID-19: Secondary | ICD-10-CM | POA: Diagnosis not present

## 2020-02-17 DIAGNOSIS — J301 Allergic rhinitis due to pollen: Secondary | ICD-10-CM | POA: Diagnosis not present

## 2020-02-17 DIAGNOSIS — I10 Essential (primary) hypertension: Secondary | ICD-10-CM | POA: Diagnosis not present

## 2020-02-17 DIAGNOSIS — J449 Chronic obstructive pulmonary disease, unspecified: Secondary | ICD-10-CM | POA: Diagnosis not present

## 2020-02-20 DIAGNOSIS — Z7984 Long term (current) use of oral hypoglycemic drugs: Secondary | ICD-10-CM | POA: Diagnosis not present

## 2020-02-20 DIAGNOSIS — E1169 Type 2 diabetes mellitus with other specified complication: Secondary | ICD-10-CM | POA: Diagnosis not present

## 2020-02-20 DIAGNOSIS — Z7951 Long term (current) use of inhaled steroids: Secondary | ICD-10-CM | POA: Diagnosis not present

## 2020-02-20 DIAGNOSIS — E782 Mixed hyperlipidemia: Secondary | ICD-10-CM | POA: Diagnosis not present

## 2020-02-20 DIAGNOSIS — K5904 Chronic idiopathic constipation: Secondary | ICD-10-CM | POA: Diagnosis not present

## 2020-02-20 DIAGNOSIS — M1990 Unspecified osteoarthritis, unspecified site: Secondary | ICD-10-CM | POA: Diagnosis not present

## 2020-02-20 DIAGNOSIS — I10 Essential (primary) hypertension: Secondary | ICD-10-CM | POA: Diagnosis not present

## 2020-02-20 DIAGNOSIS — J301 Allergic rhinitis due to pollen: Secondary | ICD-10-CM | POA: Diagnosis not present

## 2020-02-20 DIAGNOSIS — K2100 Gastro-esophageal reflux disease with esophagitis, without bleeding: Secondary | ICD-10-CM | POA: Diagnosis not present

## 2020-02-20 DIAGNOSIS — N1832 Chronic kidney disease, stage 3b: Secondary | ICD-10-CM | POA: Diagnosis not present

## 2020-02-20 DIAGNOSIS — R269 Unspecified abnormalities of gait and mobility: Secondary | ICD-10-CM | POA: Diagnosis not present

## 2020-02-20 DIAGNOSIS — M545 Low back pain: Secondary | ICD-10-CM | POA: Diagnosis not present

## 2020-02-20 DIAGNOSIS — Z8616 Personal history of COVID-19: Secondary | ICD-10-CM | POA: Diagnosis not present

## 2020-02-20 DIAGNOSIS — G8929 Other chronic pain: Secondary | ICD-10-CM | POA: Diagnosis not present

## 2020-02-20 DIAGNOSIS — J449 Chronic obstructive pulmonary disease, unspecified: Secondary | ICD-10-CM | POA: Diagnosis not present

## 2020-02-20 DIAGNOSIS — E1122 Type 2 diabetes mellitus with diabetic chronic kidney disease: Secondary | ICD-10-CM | POA: Diagnosis not present

## 2020-02-21 DIAGNOSIS — K2100 Gastro-esophageal reflux disease with esophagitis, without bleeding: Secondary | ICD-10-CM | POA: Diagnosis not present

## 2020-02-21 DIAGNOSIS — N1832 Chronic kidney disease, stage 3b: Secondary | ICD-10-CM | POA: Diagnosis not present

## 2020-02-21 DIAGNOSIS — E1169 Type 2 diabetes mellitus with other specified complication: Secondary | ICD-10-CM | POA: Diagnosis not present

## 2020-02-21 DIAGNOSIS — K5904 Chronic idiopathic constipation: Secondary | ICD-10-CM | POA: Diagnosis not present

## 2020-02-21 DIAGNOSIS — E1122 Type 2 diabetes mellitus with diabetic chronic kidney disease: Secondary | ICD-10-CM | POA: Diagnosis not present

## 2020-02-21 DIAGNOSIS — M1990 Unspecified osteoarthritis, unspecified site: Secondary | ICD-10-CM | POA: Diagnosis not present

## 2020-02-21 DIAGNOSIS — Z7951 Long term (current) use of inhaled steroids: Secondary | ICD-10-CM | POA: Diagnosis not present

## 2020-02-21 DIAGNOSIS — M545 Low back pain: Secondary | ICD-10-CM | POA: Diagnosis not present

## 2020-02-21 DIAGNOSIS — G8929 Other chronic pain: Secondary | ICD-10-CM | POA: Diagnosis not present

## 2020-02-21 DIAGNOSIS — Z7984 Long term (current) use of oral hypoglycemic drugs: Secondary | ICD-10-CM | POA: Diagnosis not present

## 2020-02-21 DIAGNOSIS — E782 Mixed hyperlipidemia: Secondary | ICD-10-CM | POA: Diagnosis not present

## 2020-02-21 DIAGNOSIS — I10 Essential (primary) hypertension: Secondary | ICD-10-CM | POA: Diagnosis not present

## 2020-02-21 DIAGNOSIS — J301 Allergic rhinitis due to pollen: Secondary | ICD-10-CM | POA: Diagnosis not present

## 2020-02-21 DIAGNOSIS — J449 Chronic obstructive pulmonary disease, unspecified: Secondary | ICD-10-CM | POA: Diagnosis not present

## 2020-02-21 DIAGNOSIS — R269 Unspecified abnormalities of gait and mobility: Secondary | ICD-10-CM | POA: Diagnosis not present

## 2020-02-21 DIAGNOSIS — Z8616 Personal history of COVID-19: Secondary | ICD-10-CM | POA: Diagnosis not present

## 2020-02-27 DIAGNOSIS — Z7984 Long term (current) use of oral hypoglycemic drugs: Secondary | ICD-10-CM | POA: Diagnosis not present

## 2020-02-27 DIAGNOSIS — M1990 Unspecified osteoarthritis, unspecified site: Secondary | ICD-10-CM | POA: Diagnosis not present

## 2020-02-27 DIAGNOSIS — G8929 Other chronic pain: Secondary | ICD-10-CM | POA: Diagnosis not present

## 2020-02-27 DIAGNOSIS — E1169 Type 2 diabetes mellitus with other specified complication: Secondary | ICD-10-CM | POA: Diagnosis not present

## 2020-02-27 DIAGNOSIS — N1832 Chronic kidney disease, stage 3b: Secondary | ICD-10-CM | POA: Diagnosis not present

## 2020-02-27 DIAGNOSIS — J449 Chronic obstructive pulmonary disease, unspecified: Secondary | ICD-10-CM | POA: Diagnosis not present

## 2020-02-27 DIAGNOSIS — I10 Essential (primary) hypertension: Secondary | ICD-10-CM | POA: Diagnosis not present

## 2020-02-27 DIAGNOSIS — J301 Allergic rhinitis due to pollen: Secondary | ICD-10-CM | POA: Diagnosis not present

## 2020-02-27 DIAGNOSIS — R269 Unspecified abnormalities of gait and mobility: Secondary | ICD-10-CM | POA: Diagnosis not present

## 2020-02-27 DIAGNOSIS — K5904 Chronic idiopathic constipation: Secondary | ICD-10-CM | POA: Diagnosis not present

## 2020-02-27 DIAGNOSIS — E1122 Type 2 diabetes mellitus with diabetic chronic kidney disease: Secondary | ICD-10-CM | POA: Diagnosis not present

## 2020-02-27 DIAGNOSIS — K2100 Gastro-esophageal reflux disease with esophagitis, without bleeding: Secondary | ICD-10-CM | POA: Diagnosis not present

## 2020-02-27 DIAGNOSIS — Z8616 Personal history of COVID-19: Secondary | ICD-10-CM | POA: Diagnosis not present

## 2020-02-27 DIAGNOSIS — M545 Low back pain: Secondary | ICD-10-CM | POA: Diagnosis not present

## 2020-02-27 DIAGNOSIS — E782 Mixed hyperlipidemia: Secondary | ICD-10-CM | POA: Diagnosis not present

## 2020-02-27 DIAGNOSIS — Z7951 Long term (current) use of inhaled steroids: Secondary | ICD-10-CM | POA: Diagnosis not present

## 2020-02-28 DIAGNOSIS — G8929 Other chronic pain: Secondary | ICD-10-CM | POA: Diagnosis not present

## 2020-02-28 DIAGNOSIS — M545 Low back pain: Secondary | ICD-10-CM | POA: Diagnosis not present

## 2020-02-28 DIAGNOSIS — K5904 Chronic idiopathic constipation: Secondary | ICD-10-CM | POA: Diagnosis not present

## 2020-02-28 DIAGNOSIS — Z8616 Personal history of COVID-19: Secondary | ICD-10-CM | POA: Diagnosis not present

## 2020-02-28 DIAGNOSIS — I10 Essential (primary) hypertension: Secondary | ICD-10-CM | POA: Diagnosis not present

## 2020-02-28 DIAGNOSIS — E1122 Type 2 diabetes mellitus with diabetic chronic kidney disease: Secondary | ICD-10-CM | POA: Diagnosis not present

## 2020-02-28 DIAGNOSIS — E782 Mixed hyperlipidemia: Secondary | ICD-10-CM | POA: Diagnosis not present

## 2020-02-28 DIAGNOSIS — E1169 Type 2 diabetes mellitus with other specified complication: Secondary | ICD-10-CM | POA: Diagnosis not present

## 2020-02-28 DIAGNOSIS — Z7984 Long term (current) use of oral hypoglycemic drugs: Secondary | ICD-10-CM | POA: Diagnosis not present

## 2020-02-28 DIAGNOSIS — R269 Unspecified abnormalities of gait and mobility: Secondary | ICD-10-CM | POA: Diagnosis not present

## 2020-02-28 DIAGNOSIS — N1832 Chronic kidney disease, stage 3b: Secondary | ICD-10-CM | POA: Diagnosis not present

## 2020-02-28 DIAGNOSIS — J301 Allergic rhinitis due to pollen: Secondary | ICD-10-CM | POA: Diagnosis not present

## 2020-02-28 DIAGNOSIS — K2100 Gastro-esophageal reflux disease with esophagitis, without bleeding: Secondary | ICD-10-CM | POA: Diagnosis not present

## 2020-02-28 DIAGNOSIS — J449 Chronic obstructive pulmonary disease, unspecified: Secondary | ICD-10-CM | POA: Diagnosis not present

## 2020-02-28 DIAGNOSIS — Z7951 Long term (current) use of inhaled steroids: Secondary | ICD-10-CM | POA: Diagnosis not present

## 2020-02-28 DIAGNOSIS — M1990 Unspecified osteoarthritis, unspecified site: Secondary | ICD-10-CM | POA: Diagnosis not present

## 2020-03-05 DIAGNOSIS — N1832 Chronic kidney disease, stage 3b: Secondary | ICD-10-CM | POA: Diagnosis not present

## 2020-03-05 DIAGNOSIS — E1169 Type 2 diabetes mellitus with other specified complication: Secondary | ICD-10-CM | POA: Diagnosis not present

## 2020-03-05 DIAGNOSIS — K5904 Chronic idiopathic constipation: Secondary | ICD-10-CM | POA: Diagnosis not present

## 2020-03-05 DIAGNOSIS — J301 Allergic rhinitis due to pollen: Secondary | ICD-10-CM | POA: Diagnosis not present

## 2020-03-05 DIAGNOSIS — Z8616 Personal history of COVID-19: Secondary | ICD-10-CM | POA: Diagnosis not present

## 2020-03-05 DIAGNOSIS — E1122 Type 2 diabetes mellitus with diabetic chronic kidney disease: Secondary | ICD-10-CM | POA: Diagnosis not present

## 2020-03-05 DIAGNOSIS — M1990 Unspecified osteoarthritis, unspecified site: Secondary | ICD-10-CM | POA: Diagnosis not present

## 2020-03-05 DIAGNOSIS — G8929 Other chronic pain: Secondary | ICD-10-CM | POA: Diagnosis not present

## 2020-03-05 DIAGNOSIS — J449 Chronic obstructive pulmonary disease, unspecified: Secondary | ICD-10-CM | POA: Diagnosis not present

## 2020-03-05 DIAGNOSIS — Z7951 Long term (current) use of inhaled steroids: Secondary | ICD-10-CM | POA: Diagnosis not present

## 2020-03-05 DIAGNOSIS — I10 Essential (primary) hypertension: Secondary | ICD-10-CM | POA: Diagnosis not present

## 2020-03-05 DIAGNOSIS — Z7984 Long term (current) use of oral hypoglycemic drugs: Secondary | ICD-10-CM | POA: Diagnosis not present

## 2020-03-05 DIAGNOSIS — R269 Unspecified abnormalities of gait and mobility: Secondary | ICD-10-CM | POA: Diagnosis not present

## 2020-03-05 DIAGNOSIS — E782 Mixed hyperlipidemia: Secondary | ICD-10-CM | POA: Diagnosis not present

## 2020-03-05 DIAGNOSIS — K2100 Gastro-esophageal reflux disease with esophagitis, without bleeding: Secondary | ICD-10-CM | POA: Diagnosis not present

## 2020-03-05 DIAGNOSIS — M545 Low back pain: Secondary | ICD-10-CM | POA: Diagnosis not present

## 2020-03-08 DIAGNOSIS — E1122 Type 2 diabetes mellitus with diabetic chronic kidney disease: Secondary | ICD-10-CM | POA: Diagnosis not present

## 2020-03-08 DIAGNOSIS — E782 Mixed hyperlipidemia: Secondary | ICD-10-CM | POA: Diagnosis not present

## 2020-03-08 DIAGNOSIS — N1832 Chronic kidney disease, stage 3b: Secondary | ICD-10-CM | POA: Diagnosis not present

## 2020-03-08 DIAGNOSIS — K5904 Chronic idiopathic constipation: Secondary | ICD-10-CM | POA: Diagnosis not present

## 2020-03-08 DIAGNOSIS — K2100 Gastro-esophageal reflux disease with esophagitis, without bleeding: Secondary | ICD-10-CM | POA: Diagnosis not present

## 2020-03-08 DIAGNOSIS — M545 Low back pain: Secondary | ICD-10-CM | POA: Diagnosis not present

## 2020-03-08 DIAGNOSIS — Z8616 Personal history of COVID-19: Secondary | ICD-10-CM | POA: Diagnosis not present

## 2020-03-08 DIAGNOSIS — Z7951 Long term (current) use of inhaled steroids: Secondary | ICD-10-CM | POA: Diagnosis not present

## 2020-03-08 DIAGNOSIS — G8929 Other chronic pain: Secondary | ICD-10-CM | POA: Diagnosis not present

## 2020-03-08 DIAGNOSIS — Z7984 Long term (current) use of oral hypoglycemic drugs: Secondary | ICD-10-CM | POA: Diagnosis not present

## 2020-03-08 DIAGNOSIS — R269 Unspecified abnormalities of gait and mobility: Secondary | ICD-10-CM | POA: Diagnosis not present

## 2020-03-08 DIAGNOSIS — M1990 Unspecified osteoarthritis, unspecified site: Secondary | ICD-10-CM | POA: Diagnosis not present

## 2020-03-08 DIAGNOSIS — J449 Chronic obstructive pulmonary disease, unspecified: Secondary | ICD-10-CM | POA: Diagnosis not present

## 2020-03-08 DIAGNOSIS — E1169 Type 2 diabetes mellitus with other specified complication: Secondary | ICD-10-CM | POA: Diagnosis not present

## 2020-03-08 DIAGNOSIS — I10 Essential (primary) hypertension: Secondary | ICD-10-CM | POA: Diagnosis not present

## 2020-03-08 DIAGNOSIS — J301 Allergic rhinitis due to pollen: Secondary | ICD-10-CM | POA: Diagnosis not present

## 2020-03-14 DIAGNOSIS — G8929 Other chronic pain: Secondary | ICD-10-CM | POA: Diagnosis not present

## 2020-03-14 DIAGNOSIS — N1832 Chronic kidney disease, stage 3b: Secondary | ICD-10-CM | POA: Diagnosis not present

## 2020-03-14 DIAGNOSIS — E782 Mixed hyperlipidemia: Secondary | ICD-10-CM | POA: Diagnosis not present

## 2020-03-14 DIAGNOSIS — Z8616 Personal history of COVID-19: Secondary | ICD-10-CM | POA: Diagnosis not present

## 2020-03-14 DIAGNOSIS — J301 Allergic rhinitis due to pollen: Secondary | ICD-10-CM | POA: Diagnosis not present

## 2020-03-14 DIAGNOSIS — E1122 Type 2 diabetes mellitus with diabetic chronic kidney disease: Secondary | ICD-10-CM | POA: Diagnosis not present

## 2020-03-14 DIAGNOSIS — Z7951 Long term (current) use of inhaled steroids: Secondary | ICD-10-CM | POA: Diagnosis not present

## 2020-03-14 DIAGNOSIS — M545 Low back pain: Secondary | ICD-10-CM | POA: Diagnosis not present

## 2020-03-14 DIAGNOSIS — Z7984 Long term (current) use of oral hypoglycemic drugs: Secondary | ICD-10-CM | POA: Diagnosis not present

## 2020-03-14 DIAGNOSIS — E1169 Type 2 diabetes mellitus with other specified complication: Secondary | ICD-10-CM | POA: Diagnosis not present

## 2020-03-14 DIAGNOSIS — J449 Chronic obstructive pulmonary disease, unspecified: Secondary | ICD-10-CM | POA: Diagnosis not present

## 2020-03-14 DIAGNOSIS — I10 Essential (primary) hypertension: Secondary | ICD-10-CM | POA: Diagnosis not present

## 2020-03-14 DIAGNOSIS — M1990 Unspecified osteoarthritis, unspecified site: Secondary | ICD-10-CM | POA: Diagnosis not present

## 2020-03-14 DIAGNOSIS — R269 Unspecified abnormalities of gait and mobility: Secondary | ICD-10-CM | POA: Diagnosis not present

## 2020-03-14 DIAGNOSIS — K2100 Gastro-esophageal reflux disease with esophagitis, without bleeding: Secondary | ICD-10-CM | POA: Diagnosis not present

## 2020-03-14 DIAGNOSIS — K5904 Chronic idiopathic constipation: Secondary | ICD-10-CM | POA: Diagnosis not present

## 2020-03-15 DIAGNOSIS — Z8616 Personal history of COVID-19: Secondary | ICD-10-CM | POA: Diagnosis not present

## 2020-03-15 DIAGNOSIS — G8929 Other chronic pain: Secondary | ICD-10-CM | POA: Diagnosis not present

## 2020-03-15 DIAGNOSIS — M1990 Unspecified osteoarthritis, unspecified site: Secondary | ICD-10-CM | POA: Diagnosis not present

## 2020-03-15 DIAGNOSIS — K5904 Chronic idiopathic constipation: Secondary | ICD-10-CM | POA: Diagnosis not present

## 2020-03-15 DIAGNOSIS — Z7951 Long term (current) use of inhaled steroids: Secondary | ICD-10-CM | POA: Diagnosis not present

## 2020-03-15 DIAGNOSIS — Z7984 Long term (current) use of oral hypoglycemic drugs: Secondary | ICD-10-CM | POA: Diagnosis not present

## 2020-03-15 DIAGNOSIS — E1122 Type 2 diabetes mellitus with diabetic chronic kidney disease: Secondary | ICD-10-CM | POA: Diagnosis not present

## 2020-03-15 DIAGNOSIS — J301 Allergic rhinitis due to pollen: Secondary | ICD-10-CM | POA: Diagnosis not present

## 2020-03-15 DIAGNOSIS — R269 Unspecified abnormalities of gait and mobility: Secondary | ICD-10-CM | POA: Diagnosis not present

## 2020-03-15 DIAGNOSIS — J449 Chronic obstructive pulmonary disease, unspecified: Secondary | ICD-10-CM | POA: Diagnosis not present

## 2020-03-15 DIAGNOSIS — K2100 Gastro-esophageal reflux disease with esophagitis, without bleeding: Secondary | ICD-10-CM | POA: Diagnosis not present

## 2020-03-15 DIAGNOSIS — E782 Mixed hyperlipidemia: Secondary | ICD-10-CM | POA: Diagnosis not present

## 2020-03-15 DIAGNOSIS — E1169 Type 2 diabetes mellitus with other specified complication: Secondary | ICD-10-CM | POA: Diagnosis not present

## 2020-03-15 DIAGNOSIS — N1832 Chronic kidney disease, stage 3b: Secondary | ICD-10-CM | POA: Diagnosis not present

## 2020-03-15 DIAGNOSIS — M545 Low back pain: Secondary | ICD-10-CM | POA: Diagnosis not present

## 2020-03-15 DIAGNOSIS — I10 Essential (primary) hypertension: Secondary | ICD-10-CM | POA: Diagnosis not present

## 2020-03-19 DIAGNOSIS — Z20822 Contact with and (suspected) exposure to covid-19: Secondary | ICD-10-CM | POA: Diagnosis not present

## 2020-03-23 DIAGNOSIS — Z7951 Long term (current) use of inhaled steroids: Secondary | ICD-10-CM | POA: Diagnosis not present

## 2020-03-23 DIAGNOSIS — Z7984 Long term (current) use of oral hypoglycemic drugs: Secondary | ICD-10-CM | POA: Diagnosis not present

## 2020-03-23 DIAGNOSIS — M545 Low back pain: Secondary | ICD-10-CM | POA: Diagnosis not present

## 2020-03-23 DIAGNOSIS — K2100 Gastro-esophageal reflux disease with esophagitis, without bleeding: Secondary | ICD-10-CM | POA: Diagnosis not present

## 2020-03-23 DIAGNOSIS — K5904 Chronic idiopathic constipation: Secondary | ICD-10-CM | POA: Diagnosis not present

## 2020-03-23 DIAGNOSIS — M1990 Unspecified osteoarthritis, unspecified site: Secondary | ICD-10-CM | POA: Diagnosis not present

## 2020-03-23 DIAGNOSIS — G8929 Other chronic pain: Secondary | ICD-10-CM | POA: Diagnosis not present

## 2020-03-23 DIAGNOSIS — Z8616 Personal history of COVID-19: Secondary | ICD-10-CM | POA: Diagnosis not present

## 2020-03-23 DIAGNOSIS — E1169 Type 2 diabetes mellitus with other specified complication: Secondary | ICD-10-CM | POA: Diagnosis not present

## 2020-03-23 DIAGNOSIS — I10 Essential (primary) hypertension: Secondary | ICD-10-CM | POA: Diagnosis not present

## 2020-03-23 DIAGNOSIS — J301 Allergic rhinitis due to pollen: Secondary | ICD-10-CM | POA: Diagnosis not present

## 2020-03-23 DIAGNOSIS — J449 Chronic obstructive pulmonary disease, unspecified: Secondary | ICD-10-CM | POA: Diagnosis not present

## 2020-03-23 DIAGNOSIS — R269 Unspecified abnormalities of gait and mobility: Secondary | ICD-10-CM | POA: Diagnosis not present

## 2020-03-23 DIAGNOSIS — E782 Mixed hyperlipidemia: Secondary | ICD-10-CM | POA: Diagnosis not present

## 2020-03-23 DIAGNOSIS — E1122 Type 2 diabetes mellitus with diabetic chronic kidney disease: Secondary | ICD-10-CM | POA: Diagnosis not present

## 2020-03-23 DIAGNOSIS — N1832 Chronic kidney disease, stage 3b: Secondary | ICD-10-CM | POA: Diagnosis not present

## 2020-03-27 DIAGNOSIS — I10 Essential (primary) hypertension: Secondary | ICD-10-CM | POA: Diagnosis not present

## 2020-03-27 DIAGNOSIS — M545 Low back pain: Secondary | ICD-10-CM | POA: Diagnosis not present

## 2020-03-27 DIAGNOSIS — E782 Mixed hyperlipidemia: Secondary | ICD-10-CM | POA: Diagnosis not present

## 2020-03-27 DIAGNOSIS — R269 Unspecified abnormalities of gait and mobility: Secondary | ICD-10-CM | POA: Diagnosis not present

## 2020-03-27 DIAGNOSIS — E1169 Type 2 diabetes mellitus with other specified complication: Secondary | ICD-10-CM | POA: Diagnosis not present

## 2020-03-27 DIAGNOSIS — N1832 Chronic kidney disease, stage 3b: Secondary | ICD-10-CM | POA: Diagnosis not present

## 2020-03-27 DIAGNOSIS — K2100 Gastro-esophageal reflux disease with esophagitis, without bleeding: Secondary | ICD-10-CM | POA: Diagnosis not present

## 2020-03-27 DIAGNOSIS — J301 Allergic rhinitis due to pollen: Secondary | ICD-10-CM | POA: Diagnosis not present

## 2020-03-27 DIAGNOSIS — E1122 Type 2 diabetes mellitus with diabetic chronic kidney disease: Secondary | ICD-10-CM | POA: Diagnosis not present

## 2020-03-27 DIAGNOSIS — G8929 Other chronic pain: Secondary | ICD-10-CM | POA: Diagnosis not present

## 2020-03-27 DIAGNOSIS — Z7951 Long term (current) use of inhaled steroids: Secondary | ICD-10-CM | POA: Diagnosis not present

## 2020-03-27 DIAGNOSIS — Z7984 Long term (current) use of oral hypoglycemic drugs: Secondary | ICD-10-CM | POA: Diagnosis not present

## 2020-03-27 DIAGNOSIS — J449 Chronic obstructive pulmonary disease, unspecified: Secondary | ICD-10-CM | POA: Diagnosis not present

## 2020-03-27 DIAGNOSIS — M1990 Unspecified osteoarthritis, unspecified site: Secondary | ICD-10-CM | POA: Diagnosis not present

## 2020-03-27 DIAGNOSIS — K5904 Chronic idiopathic constipation: Secondary | ICD-10-CM | POA: Diagnosis not present

## 2020-03-30 DIAGNOSIS — E7849 Other hyperlipidemia: Secondary | ICD-10-CM | POA: Diagnosis not present

## 2020-03-30 DIAGNOSIS — R5381 Other malaise: Secondary | ICD-10-CM | POA: Diagnosis not present

## 2020-03-30 DIAGNOSIS — S31829A Unspecified open wound of left buttock, initial encounter: Secondary | ICD-10-CM | POA: Diagnosis not present

## 2020-03-30 DIAGNOSIS — E038 Other specified hypothyroidism: Secondary | ICD-10-CM | POA: Diagnosis not present

## 2020-03-30 DIAGNOSIS — D518 Other vitamin B12 deficiency anemias: Secondary | ICD-10-CM | POA: Diagnosis not present

## 2020-03-30 DIAGNOSIS — J449 Chronic obstructive pulmonary disease, unspecified: Secondary | ICD-10-CM | POA: Diagnosis not present

## 2020-03-30 DIAGNOSIS — R531 Weakness: Secondary | ICD-10-CM | POA: Diagnosis not present

## 2020-03-30 DIAGNOSIS — Z79899 Other long term (current) drug therapy: Secondary | ICD-10-CM | POA: Diagnosis not present

## 2020-03-30 DIAGNOSIS — E119 Type 2 diabetes mellitus without complications: Secondary | ICD-10-CM | POA: Diagnosis not present

## 2020-03-31 DIAGNOSIS — N189 Chronic kidney disease, unspecified: Secondary | ICD-10-CM | POA: Diagnosis not present

## 2020-03-31 DIAGNOSIS — I129 Hypertensive chronic kidney disease with stage 1 through stage 4 chronic kidney disease, or unspecified chronic kidney disease: Secondary | ICD-10-CM | POA: Diagnosis not present

## 2020-03-31 DIAGNOSIS — E119 Type 2 diabetes mellitus without complications: Secondary | ICD-10-CM | POA: Diagnosis not present

## 2020-03-31 DIAGNOSIS — K5900 Constipation, unspecified: Secondary | ICD-10-CM | POA: Diagnosis not present

## 2020-03-31 DIAGNOSIS — R531 Weakness: Secondary | ICD-10-CM | POA: Diagnosis not present

## 2020-03-31 DIAGNOSIS — R2689 Other abnormalities of gait and mobility: Secondary | ICD-10-CM | POA: Diagnosis not present

## 2020-03-31 DIAGNOSIS — J449 Chronic obstructive pulmonary disease, unspecified: Secondary | ICD-10-CM | POA: Diagnosis not present

## 2020-03-31 DIAGNOSIS — Z87891 Personal history of nicotine dependence: Secondary | ICD-10-CM | POA: Diagnosis not present

## 2020-03-31 DIAGNOSIS — S31829D Unspecified open wound of left buttock, subsequent encounter: Secondary | ICD-10-CM | POA: Diagnosis not present

## 2020-04-02 DIAGNOSIS — E119 Type 2 diabetes mellitus without complications: Secondary | ICD-10-CM | POA: Diagnosis not present

## 2020-04-02 DIAGNOSIS — N189 Chronic kidney disease, unspecified: Secondary | ICD-10-CM | POA: Diagnosis not present

## 2020-04-02 DIAGNOSIS — J449 Chronic obstructive pulmonary disease, unspecified: Secondary | ICD-10-CM | POA: Diagnosis not present

## 2020-04-02 DIAGNOSIS — I129 Hypertensive chronic kidney disease with stage 1 through stage 4 chronic kidney disease, or unspecified chronic kidney disease: Secondary | ICD-10-CM | POA: Diagnosis not present

## 2020-04-02 DIAGNOSIS — Z87891 Personal history of nicotine dependence: Secondary | ICD-10-CM | POA: Diagnosis not present

## 2020-04-02 DIAGNOSIS — R2689 Other abnormalities of gait and mobility: Secondary | ICD-10-CM | POA: Diagnosis not present

## 2020-04-02 DIAGNOSIS — K5900 Constipation, unspecified: Secondary | ICD-10-CM | POA: Diagnosis not present

## 2020-04-02 DIAGNOSIS — S31829D Unspecified open wound of left buttock, subsequent encounter: Secondary | ICD-10-CM | POA: Diagnosis not present

## 2020-04-02 DIAGNOSIS — R531 Weakness: Secondary | ICD-10-CM | POA: Diagnosis not present

## 2020-04-03 DIAGNOSIS — I129 Hypertensive chronic kidney disease with stage 1 through stage 4 chronic kidney disease, or unspecified chronic kidney disease: Secondary | ICD-10-CM | POA: Diagnosis not present

## 2020-04-03 DIAGNOSIS — R2689 Other abnormalities of gait and mobility: Secondary | ICD-10-CM | POA: Diagnosis not present

## 2020-04-03 DIAGNOSIS — Z87891 Personal history of nicotine dependence: Secondary | ICD-10-CM | POA: Diagnosis not present

## 2020-04-03 DIAGNOSIS — N189 Chronic kidney disease, unspecified: Secondary | ICD-10-CM | POA: Diagnosis not present

## 2020-04-03 DIAGNOSIS — R062 Wheezing: Secondary | ICD-10-CM | POA: Diagnosis not present

## 2020-04-03 DIAGNOSIS — S31829D Unspecified open wound of left buttock, subsequent encounter: Secondary | ICD-10-CM | POA: Diagnosis not present

## 2020-04-03 DIAGNOSIS — K5900 Constipation, unspecified: Secondary | ICD-10-CM | POA: Diagnosis not present

## 2020-04-03 DIAGNOSIS — E119 Type 2 diabetes mellitus without complications: Secondary | ICD-10-CM | POA: Diagnosis not present

## 2020-04-03 DIAGNOSIS — J449 Chronic obstructive pulmonary disease, unspecified: Secondary | ICD-10-CM | POA: Diagnosis not present

## 2020-04-03 DIAGNOSIS — R531 Weakness: Secondary | ICD-10-CM | POA: Diagnosis not present

## 2020-04-04 DIAGNOSIS — K5900 Constipation, unspecified: Secondary | ICD-10-CM | POA: Diagnosis not present

## 2020-04-04 DIAGNOSIS — R2689 Other abnormalities of gait and mobility: Secondary | ICD-10-CM | POA: Diagnosis not present

## 2020-04-04 DIAGNOSIS — R531 Weakness: Secondary | ICD-10-CM | POA: Diagnosis not present

## 2020-04-04 DIAGNOSIS — E119 Type 2 diabetes mellitus without complications: Secondary | ICD-10-CM | POA: Diagnosis not present

## 2020-04-04 DIAGNOSIS — S31829D Unspecified open wound of left buttock, subsequent encounter: Secondary | ICD-10-CM | POA: Diagnosis not present

## 2020-04-04 DIAGNOSIS — I129 Hypertensive chronic kidney disease with stage 1 through stage 4 chronic kidney disease, or unspecified chronic kidney disease: Secondary | ICD-10-CM | POA: Diagnosis not present

## 2020-04-04 DIAGNOSIS — N189 Chronic kidney disease, unspecified: Secondary | ICD-10-CM | POA: Diagnosis not present

## 2020-04-04 DIAGNOSIS — J449 Chronic obstructive pulmonary disease, unspecified: Secondary | ICD-10-CM | POA: Diagnosis not present

## 2020-04-04 DIAGNOSIS — Z87891 Personal history of nicotine dependence: Secondary | ICD-10-CM | POA: Diagnosis not present

## 2020-04-05 DIAGNOSIS — Z7951 Long term (current) use of inhaled steroids: Secondary | ICD-10-CM | POA: Diagnosis not present

## 2020-04-05 DIAGNOSIS — E782 Mixed hyperlipidemia: Secondary | ICD-10-CM | POA: Diagnosis not present

## 2020-04-05 DIAGNOSIS — N1832 Chronic kidney disease, stage 3b: Secondary | ICD-10-CM | POA: Diagnosis not present

## 2020-04-05 DIAGNOSIS — K5904 Chronic idiopathic constipation: Secondary | ICD-10-CM | POA: Diagnosis not present

## 2020-04-05 DIAGNOSIS — E1122 Type 2 diabetes mellitus with diabetic chronic kidney disease: Secondary | ICD-10-CM | POA: Diagnosis not present

## 2020-04-05 DIAGNOSIS — E1169 Type 2 diabetes mellitus with other specified complication: Secondary | ICD-10-CM | POA: Diagnosis not present

## 2020-04-05 DIAGNOSIS — K2100 Gastro-esophageal reflux disease with esophagitis, without bleeding: Secondary | ICD-10-CM | POA: Diagnosis not present

## 2020-04-05 DIAGNOSIS — Z7984 Long term (current) use of oral hypoglycemic drugs: Secondary | ICD-10-CM | POA: Diagnosis not present

## 2020-04-05 DIAGNOSIS — M1990 Unspecified osteoarthritis, unspecified site: Secondary | ICD-10-CM | POA: Diagnosis not present

## 2020-04-05 DIAGNOSIS — J301 Allergic rhinitis due to pollen: Secondary | ICD-10-CM | POA: Diagnosis not present

## 2020-04-05 DIAGNOSIS — R269 Unspecified abnormalities of gait and mobility: Secondary | ICD-10-CM | POA: Diagnosis not present

## 2020-04-05 DIAGNOSIS — M545 Low back pain: Secondary | ICD-10-CM | POA: Diagnosis not present

## 2020-04-05 DIAGNOSIS — J449 Chronic obstructive pulmonary disease, unspecified: Secondary | ICD-10-CM | POA: Diagnosis not present

## 2020-04-05 DIAGNOSIS — G8929 Other chronic pain: Secondary | ICD-10-CM | POA: Diagnosis not present

## 2020-04-05 DIAGNOSIS — I10 Essential (primary) hypertension: Secondary | ICD-10-CM | POA: Diagnosis not present

## 2020-04-06 DIAGNOSIS — K5900 Constipation, unspecified: Secondary | ICD-10-CM | POA: Diagnosis not present

## 2020-04-06 DIAGNOSIS — J449 Chronic obstructive pulmonary disease, unspecified: Secondary | ICD-10-CM | POA: Diagnosis not present

## 2020-04-06 DIAGNOSIS — R531 Weakness: Secondary | ICD-10-CM | POA: Diagnosis not present

## 2020-04-06 DIAGNOSIS — N189 Chronic kidney disease, unspecified: Secondary | ICD-10-CM | POA: Diagnosis not present

## 2020-04-06 DIAGNOSIS — S31829D Unspecified open wound of left buttock, subsequent encounter: Secondary | ICD-10-CM | POA: Diagnosis not present

## 2020-04-06 DIAGNOSIS — R2689 Other abnormalities of gait and mobility: Secondary | ICD-10-CM | POA: Diagnosis not present

## 2020-04-06 DIAGNOSIS — E119 Type 2 diabetes mellitus without complications: Secondary | ICD-10-CM | POA: Diagnosis not present

## 2020-04-06 DIAGNOSIS — I129 Hypertensive chronic kidney disease with stage 1 through stage 4 chronic kidney disease, or unspecified chronic kidney disease: Secondary | ICD-10-CM | POA: Diagnosis not present

## 2020-04-06 DIAGNOSIS — Z87891 Personal history of nicotine dependence: Secondary | ICD-10-CM | POA: Diagnosis not present

## 2020-04-10 DIAGNOSIS — R269 Unspecified abnormalities of gait and mobility: Secondary | ICD-10-CM | POA: Diagnosis not present

## 2020-04-10 DIAGNOSIS — S31829D Unspecified open wound of left buttock, subsequent encounter: Secondary | ICD-10-CM | POA: Diagnosis not present

## 2020-04-10 DIAGNOSIS — K2100 Gastro-esophageal reflux disease with esophagitis, without bleeding: Secondary | ICD-10-CM | POA: Diagnosis not present

## 2020-04-10 DIAGNOSIS — G8929 Other chronic pain: Secondary | ICD-10-CM | POA: Diagnosis not present

## 2020-04-10 DIAGNOSIS — E119 Type 2 diabetes mellitus without complications: Secondary | ICD-10-CM | POA: Diagnosis not present

## 2020-04-10 DIAGNOSIS — E7849 Other hyperlipidemia: Secondary | ICD-10-CM | POA: Diagnosis not present

## 2020-04-10 DIAGNOSIS — E1122 Type 2 diabetes mellitus with diabetic chronic kidney disease: Secondary | ICD-10-CM | POA: Diagnosis not present

## 2020-04-10 DIAGNOSIS — J301 Allergic rhinitis due to pollen: Secondary | ICD-10-CM | POA: Diagnosis not present

## 2020-04-10 DIAGNOSIS — R2689 Other abnormalities of gait and mobility: Secondary | ICD-10-CM | POA: Diagnosis not present

## 2020-04-10 DIAGNOSIS — M1990 Unspecified osteoarthritis, unspecified site: Secondary | ICD-10-CM | POA: Diagnosis not present

## 2020-04-10 DIAGNOSIS — Z7951 Long term (current) use of inhaled steroids: Secondary | ICD-10-CM | POA: Diagnosis not present

## 2020-04-10 DIAGNOSIS — I129 Hypertensive chronic kidney disease with stage 1 through stage 4 chronic kidney disease, or unspecified chronic kidney disease: Secondary | ICD-10-CM | POA: Diagnosis not present

## 2020-04-10 DIAGNOSIS — Z87891 Personal history of nicotine dependence: Secondary | ICD-10-CM | POA: Diagnosis not present

## 2020-04-10 DIAGNOSIS — E1169 Type 2 diabetes mellitus with other specified complication: Secondary | ICD-10-CM | POA: Diagnosis not present

## 2020-04-10 DIAGNOSIS — K5900 Constipation, unspecified: Secondary | ICD-10-CM | POA: Diagnosis not present

## 2020-04-10 DIAGNOSIS — E782 Mixed hyperlipidemia: Secondary | ICD-10-CM | POA: Diagnosis not present

## 2020-04-10 DIAGNOSIS — Z7984 Long term (current) use of oral hypoglycemic drugs: Secondary | ICD-10-CM | POA: Diagnosis not present

## 2020-04-10 DIAGNOSIS — K5904 Chronic idiopathic constipation: Secondary | ICD-10-CM | POA: Diagnosis not present

## 2020-04-10 DIAGNOSIS — R531 Weakness: Secondary | ICD-10-CM | POA: Diagnosis not present

## 2020-04-10 DIAGNOSIS — N189 Chronic kidney disease, unspecified: Secondary | ICD-10-CM | POA: Diagnosis not present

## 2020-04-10 DIAGNOSIS — N1832 Chronic kidney disease, stage 3b: Secondary | ICD-10-CM | POA: Diagnosis not present

## 2020-04-10 DIAGNOSIS — D518 Other vitamin B12 deficiency anemias: Secondary | ICD-10-CM | POA: Diagnosis not present

## 2020-04-10 DIAGNOSIS — M545 Low back pain: Secondary | ICD-10-CM | POA: Diagnosis not present

## 2020-04-10 DIAGNOSIS — Z79899 Other long term (current) drug therapy: Secondary | ICD-10-CM | POA: Diagnosis not present

## 2020-04-10 DIAGNOSIS — J449 Chronic obstructive pulmonary disease, unspecified: Secondary | ICD-10-CM | POA: Diagnosis not present

## 2020-04-10 DIAGNOSIS — I10 Essential (primary) hypertension: Secondary | ICD-10-CM | POA: Diagnosis not present

## 2020-04-17 DIAGNOSIS — I129 Hypertensive chronic kidney disease with stage 1 through stage 4 chronic kidney disease, or unspecified chronic kidney disease: Secondary | ICD-10-CM | POA: Diagnosis not present

## 2020-04-17 DIAGNOSIS — J449 Chronic obstructive pulmonary disease, unspecified: Secondary | ICD-10-CM | POA: Diagnosis not present

## 2020-04-17 DIAGNOSIS — R531 Weakness: Secondary | ICD-10-CM | POA: Diagnosis not present

## 2020-04-17 DIAGNOSIS — E119 Type 2 diabetes mellitus without complications: Secondary | ICD-10-CM | POA: Diagnosis not present

## 2020-04-17 DIAGNOSIS — R2689 Other abnormalities of gait and mobility: Secondary | ICD-10-CM | POA: Diagnosis not present

## 2020-04-17 DIAGNOSIS — S31829D Unspecified open wound of left buttock, subsequent encounter: Secondary | ICD-10-CM | POA: Diagnosis not present

## 2020-04-17 DIAGNOSIS — N189 Chronic kidney disease, unspecified: Secondary | ICD-10-CM | POA: Diagnosis not present

## 2020-04-17 DIAGNOSIS — Z87891 Personal history of nicotine dependence: Secondary | ICD-10-CM | POA: Diagnosis not present

## 2020-04-17 DIAGNOSIS — K5900 Constipation, unspecified: Secondary | ICD-10-CM | POA: Diagnosis not present

## 2020-04-23 DIAGNOSIS — I129 Hypertensive chronic kidney disease with stage 1 through stage 4 chronic kidney disease, or unspecified chronic kidney disease: Secondary | ICD-10-CM | POA: Diagnosis not present

## 2020-04-23 DIAGNOSIS — Z87891 Personal history of nicotine dependence: Secondary | ICD-10-CM | POA: Diagnosis not present

## 2020-04-23 DIAGNOSIS — S31829D Unspecified open wound of left buttock, subsequent encounter: Secondary | ICD-10-CM | POA: Diagnosis not present

## 2020-04-23 DIAGNOSIS — J449 Chronic obstructive pulmonary disease, unspecified: Secondary | ICD-10-CM | POA: Diagnosis not present

## 2020-04-23 DIAGNOSIS — R2689 Other abnormalities of gait and mobility: Secondary | ICD-10-CM | POA: Diagnosis not present

## 2020-04-23 DIAGNOSIS — K5900 Constipation, unspecified: Secondary | ICD-10-CM | POA: Diagnosis not present

## 2020-04-23 DIAGNOSIS — R531 Weakness: Secondary | ICD-10-CM | POA: Diagnosis not present

## 2020-04-23 DIAGNOSIS — N189 Chronic kidney disease, unspecified: Secondary | ICD-10-CM | POA: Diagnosis not present

## 2020-04-23 DIAGNOSIS — E119 Type 2 diabetes mellitus without complications: Secondary | ICD-10-CM | POA: Diagnosis not present

## 2020-04-24 DIAGNOSIS — S31829D Unspecified open wound of left buttock, subsequent encounter: Secondary | ICD-10-CM | POA: Diagnosis not present

## 2020-04-24 DIAGNOSIS — R531 Weakness: Secondary | ICD-10-CM | POA: Diagnosis not present

## 2020-04-24 DIAGNOSIS — R2689 Other abnormalities of gait and mobility: Secondary | ICD-10-CM | POA: Diagnosis not present

## 2020-04-24 DIAGNOSIS — I129 Hypertensive chronic kidney disease with stage 1 through stage 4 chronic kidney disease, or unspecified chronic kidney disease: Secondary | ICD-10-CM | POA: Diagnosis not present

## 2020-04-24 DIAGNOSIS — J449 Chronic obstructive pulmonary disease, unspecified: Secondary | ICD-10-CM | POA: Diagnosis not present

## 2020-04-24 DIAGNOSIS — E119 Type 2 diabetes mellitus without complications: Secondary | ICD-10-CM | POA: Diagnosis not present

## 2020-04-24 DIAGNOSIS — K5900 Constipation, unspecified: Secondary | ICD-10-CM | POA: Diagnosis not present

## 2020-04-24 DIAGNOSIS — Z87891 Personal history of nicotine dependence: Secondary | ICD-10-CM | POA: Diagnosis not present

## 2020-04-24 DIAGNOSIS — N189 Chronic kidney disease, unspecified: Secondary | ICD-10-CM | POA: Diagnosis not present

## 2020-04-25 DIAGNOSIS — E7849 Other hyperlipidemia: Secondary | ICD-10-CM | POA: Diagnosis not present

## 2020-04-25 DIAGNOSIS — E119 Type 2 diabetes mellitus without complications: Secondary | ICD-10-CM | POA: Diagnosis not present

## 2020-04-25 DIAGNOSIS — Z79899 Other long term (current) drug therapy: Secondary | ICD-10-CM | POA: Diagnosis not present

## 2020-04-25 DIAGNOSIS — D518 Other vitamin B12 deficiency anemias: Secondary | ICD-10-CM | POA: Diagnosis not present

## 2020-05-01 DIAGNOSIS — K5900 Constipation, unspecified: Secondary | ICD-10-CM | POA: Diagnosis not present

## 2020-05-01 DIAGNOSIS — Z87891 Personal history of nicotine dependence: Secondary | ICD-10-CM | POA: Diagnosis not present

## 2020-05-01 DIAGNOSIS — J449 Chronic obstructive pulmonary disease, unspecified: Secondary | ICD-10-CM | POA: Diagnosis not present

## 2020-05-01 DIAGNOSIS — R2689 Other abnormalities of gait and mobility: Secondary | ICD-10-CM | POA: Diagnosis not present

## 2020-05-01 DIAGNOSIS — N189 Chronic kidney disease, unspecified: Secondary | ICD-10-CM | POA: Diagnosis not present

## 2020-05-01 DIAGNOSIS — E119 Type 2 diabetes mellitus without complications: Secondary | ICD-10-CM | POA: Diagnosis not present

## 2020-05-01 DIAGNOSIS — I129 Hypertensive chronic kidney disease with stage 1 through stage 4 chronic kidney disease, or unspecified chronic kidney disease: Secondary | ICD-10-CM | POA: Diagnosis not present

## 2020-05-01 DIAGNOSIS — R531 Weakness: Secondary | ICD-10-CM | POA: Diagnosis not present

## 2020-05-01 DIAGNOSIS — S31829D Unspecified open wound of left buttock, subsequent encounter: Secondary | ICD-10-CM | POA: Diagnosis not present

## 2020-05-03 DIAGNOSIS — N189 Chronic kidney disease, unspecified: Secondary | ICD-10-CM | POA: Diagnosis not present

## 2020-05-03 DIAGNOSIS — I129 Hypertensive chronic kidney disease with stage 1 through stage 4 chronic kidney disease, or unspecified chronic kidney disease: Secondary | ICD-10-CM | POA: Diagnosis not present

## 2020-05-03 DIAGNOSIS — J449 Chronic obstructive pulmonary disease, unspecified: Secondary | ICD-10-CM | POA: Diagnosis not present

## 2020-05-03 DIAGNOSIS — R2689 Other abnormalities of gait and mobility: Secondary | ICD-10-CM | POA: Diagnosis not present

## 2020-05-03 DIAGNOSIS — S31829D Unspecified open wound of left buttock, subsequent encounter: Secondary | ICD-10-CM | POA: Diagnosis not present

## 2020-05-03 DIAGNOSIS — E119 Type 2 diabetes mellitus without complications: Secondary | ICD-10-CM | POA: Diagnosis not present

## 2020-05-03 DIAGNOSIS — Z87891 Personal history of nicotine dependence: Secondary | ICD-10-CM | POA: Diagnosis not present

## 2020-05-03 DIAGNOSIS — K5900 Constipation, unspecified: Secondary | ICD-10-CM | POA: Diagnosis not present

## 2020-05-03 DIAGNOSIS — R531 Weakness: Secondary | ICD-10-CM | POA: Diagnosis not present

## 2020-05-08 DIAGNOSIS — N189 Chronic kidney disease, unspecified: Secondary | ICD-10-CM | POA: Diagnosis not present

## 2020-05-08 DIAGNOSIS — S31829D Unspecified open wound of left buttock, subsequent encounter: Secondary | ICD-10-CM | POA: Diagnosis not present

## 2020-05-08 DIAGNOSIS — E119 Type 2 diabetes mellitus without complications: Secondary | ICD-10-CM | POA: Diagnosis not present

## 2020-05-08 DIAGNOSIS — K5900 Constipation, unspecified: Secondary | ICD-10-CM | POA: Diagnosis not present

## 2020-05-08 DIAGNOSIS — Z87891 Personal history of nicotine dependence: Secondary | ICD-10-CM | POA: Diagnosis not present

## 2020-05-08 DIAGNOSIS — J449 Chronic obstructive pulmonary disease, unspecified: Secondary | ICD-10-CM | POA: Diagnosis not present

## 2020-05-08 DIAGNOSIS — I129 Hypertensive chronic kidney disease with stage 1 through stage 4 chronic kidney disease, or unspecified chronic kidney disease: Secondary | ICD-10-CM | POA: Diagnosis not present

## 2020-05-08 DIAGNOSIS — R2689 Other abnormalities of gait and mobility: Secondary | ICD-10-CM | POA: Diagnosis not present

## 2020-05-08 DIAGNOSIS — R531 Weakness: Secondary | ICD-10-CM | POA: Diagnosis not present

## 2020-05-09 DIAGNOSIS — I10 Essential (primary) hypertension: Secondary | ICD-10-CM | POA: Diagnosis not present

## 2020-05-09 DIAGNOSIS — J449 Chronic obstructive pulmonary disease, unspecified: Secondary | ICD-10-CM | POA: Diagnosis not present

## 2020-05-09 DIAGNOSIS — E119 Type 2 diabetes mellitus without complications: Secondary | ICD-10-CM | POA: Diagnosis not present

## 2020-05-09 DIAGNOSIS — K5900 Constipation, unspecified: Secondary | ICD-10-CM | POA: Diagnosis not present

## 2020-05-09 DIAGNOSIS — G44209 Tension-type headache, unspecified, not intractable: Secondary | ICD-10-CM | POA: Diagnosis not present

## 2020-05-10 DIAGNOSIS — E559 Vitamin D deficiency, unspecified: Secondary | ICD-10-CM | POA: Diagnosis not present

## 2020-05-10 DIAGNOSIS — E119 Type 2 diabetes mellitus without complications: Secondary | ICD-10-CM | POA: Diagnosis not present

## 2020-05-10 DIAGNOSIS — D518 Other vitamin B12 deficiency anemias: Secondary | ICD-10-CM | POA: Diagnosis not present

## 2020-05-10 DIAGNOSIS — R05 Cough: Secondary | ICD-10-CM | POA: Diagnosis not present

## 2020-05-10 DIAGNOSIS — J449 Chronic obstructive pulmonary disease, unspecified: Secondary | ICD-10-CM | POA: Diagnosis not present

## 2020-05-10 DIAGNOSIS — E038 Other specified hypothyroidism: Secondary | ICD-10-CM | POA: Diagnosis not present

## 2020-05-15 DIAGNOSIS — E119 Type 2 diabetes mellitus without complications: Secondary | ICD-10-CM | POA: Diagnosis not present

## 2020-05-15 DIAGNOSIS — K5900 Constipation, unspecified: Secondary | ICD-10-CM | POA: Diagnosis not present

## 2020-05-15 DIAGNOSIS — J449 Chronic obstructive pulmonary disease, unspecified: Secondary | ICD-10-CM | POA: Diagnosis not present

## 2020-05-15 DIAGNOSIS — R531 Weakness: Secondary | ICD-10-CM | POA: Diagnosis not present

## 2020-05-15 DIAGNOSIS — I129 Hypertensive chronic kidney disease with stage 1 through stage 4 chronic kidney disease, or unspecified chronic kidney disease: Secondary | ICD-10-CM | POA: Diagnosis not present

## 2020-05-15 DIAGNOSIS — N189 Chronic kidney disease, unspecified: Secondary | ICD-10-CM | POA: Diagnosis not present

## 2020-05-15 DIAGNOSIS — Z7984 Long term (current) use of oral hypoglycemic drugs: Secondary | ICD-10-CM | POA: Diagnosis not present

## 2020-05-15 DIAGNOSIS — R2689 Other abnormalities of gait and mobility: Secondary | ICD-10-CM | POA: Diagnosis not present

## 2020-05-15 DIAGNOSIS — Z87891 Personal history of nicotine dependence: Secondary | ICD-10-CM | POA: Diagnosis not present

## 2020-05-17 DIAGNOSIS — N189 Chronic kidney disease, unspecified: Secondary | ICD-10-CM | POA: Diagnosis not present

## 2020-05-17 DIAGNOSIS — R2689 Other abnormalities of gait and mobility: Secondary | ICD-10-CM | POA: Diagnosis not present

## 2020-05-17 DIAGNOSIS — J449 Chronic obstructive pulmonary disease, unspecified: Secondary | ICD-10-CM | POA: Diagnosis not present

## 2020-05-17 DIAGNOSIS — Z7984 Long term (current) use of oral hypoglycemic drugs: Secondary | ICD-10-CM | POA: Diagnosis not present

## 2020-05-17 DIAGNOSIS — R531 Weakness: Secondary | ICD-10-CM | POA: Diagnosis not present

## 2020-05-17 DIAGNOSIS — Z87891 Personal history of nicotine dependence: Secondary | ICD-10-CM | POA: Diagnosis not present

## 2020-05-17 DIAGNOSIS — K5900 Constipation, unspecified: Secondary | ICD-10-CM | POA: Diagnosis not present

## 2020-05-17 DIAGNOSIS — E119 Type 2 diabetes mellitus without complications: Secondary | ICD-10-CM | POA: Diagnosis not present

## 2020-05-17 DIAGNOSIS — I129 Hypertensive chronic kidney disease with stage 1 through stage 4 chronic kidney disease, or unspecified chronic kidney disease: Secondary | ICD-10-CM | POA: Diagnosis not present

## 2020-05-22 DIAGNOSIS — J449 Chronic obstructive pulmonary disease, unspecified: Secondary | ICD-10-CM | POA: Diagnosis not present

## 2020-05-22 DIAGNOSIS — Z7984 Long term (current) use of oral hypoglycemic drugs: Secondary | ICD-10-CM | POA: Diagnosis not present

## 2020-05-22 DIAGNOSIS — R531 Weakness: Secondary | ICD-10-CM | POA: Diagnosis not present

## 2020-05-22 DIAGNOSIS — N189 Chronic kidney disease, unspecified: Secondary | ICD-10-CM | POA: Diagnosis not present

## 2020-05-22 DIAGNOSIS — Z87891 Personal history of nicotine dependence: Secondary | ICD-10-CM | POA: Diagnosis not present

## 2020-05-22 DIAGNOSIS — I129 Hypertensive chronic kidney disease with stage 1 through stage 4 chronic kidney disease, or unspecified chronic kidney disease: Secondary | ICD-10-CM | POA: Diagnosis not present

## 2020-05-22 DIAGNOSIS — E119 Type 2 diabetes mellitus without complications: Secondary | ICD-10-CM | POA: Diagnosis not present

## 2020-05-22 DIAGNOSIS — R2689 Other abnormalities of gait and mobility: Secondary | ICD-10-CM | POA: Diagnosis not present

## 2020-05-22 DIAGNOSIS — K5900 Constipation, unspecified: Secondary | ICD-10-CM | POA: Diagnosis not present

## 2020-05-24 ENCOUNTER — Other Ambulatory Visit: Payer: Self-pay | Admitting: Nephrology

## 2020-05-24 ENCOUNTER — Other Ambulatory Visit (HOSPITAL_COMMUNITY): Payer: Self-pay | Admitting: Nephrology

## 2020-05-24 DIAGNOSIS — E119 Type 2 diabetes mellitus without complications: Secondary | ICD-10-CM | POA: Diagnosis not present

## 2020-05-24 DIAGNOSIS — K5900 Constipation, unspecified: Secondary | ICD-10-CM | POA: Diagnosis not present

## 2020-05-24 DIAGNOSIS — N1832 Chronic kidney disease, stage 3b: Secondary | ICD-10-CM

## 2020-05-24 DIAGNOSIS — E785 Hyperlipidemia, unspecified: Secondary | ICD-10-CM | POA: Diagnosis not present

## 2020-05-24 DIAGNOSIS — J449 Chronic obstructive pulmonary disease, unspecified: Secondary | ICD-10-CM | POA: Diagnosis not present

## 2020-05-24 DIAGNOSIS — R2689 Other abnormalities of gait and mobility: Secondary | ICD-10-CM | POA: Diagnosis not present

## 2020-05-24 DIAGNOSIS — Z87891 Personal history of nicotine dependence: Secondary | ICD-10-CM | POA: Diagnosis not present

## 2020-05-24 DIAGNOSIS — D638 Anemia in other chronic diseases classified elsewhere: Secondary | ICD-10-CM | POA: Diagnosis not present

## 2020-05-24 DIAGNOSIS — Z7984 Long term (current) use of oral hypoglycemic drugs: Secondary | ICD-10-CM | POA: Diagnosis not present

## 2020-05-24 DIAGNOSIS — I129 Hypertensive chronic kidney disease with stage 1 through stage 4 chronic kidney disease, or unspecified chronic kidney disease: Secondary | ICD-10-CM | POA: Diagnosis not present

## 2020-05-24 DIAGNOSIS — R531 Weakness: Secondary | ICD-10-CM | POA: Diagnosis not present

## 2020-05-24 DIAGNOSIS — N189 Chronic kidney disease, unspecified: Secondary | ICD-10-CM | POA: Diagnosis not present

## 2020-05-24 DIAGNOSIS — N170 Acute kidney failure with tubular necrosis: Secondary | ICD-10-CM

## 2020-05-24 DIAGNOSIS — Z79899 Other long term (current) drug therapy: Secondary | ICD-10-CM | POA: Diagnosis not present

## 2020-05-29 DIAGNOSIS — N170 Acute kidney failure with tubular necrosis: Secondary | ICD-10-CM | POA: Diagnosis not present

## 2020-05-29 DIAGNOSIS — Z79899 Other long term (current) drug therapy: Secondary | ICD-10-CM | POA: Diagnosis not present

## 2020-05-29 DIAGNOSIS — E785 Hyperlipidemia, unspecified: Secondary | ICD-10-CM | POA: Diagnosis not present

## 2020-05-29 DIAGNOSIS — R2689 Other abnormalities of gait and mobility: Secondary | ICD-10-CM | POA: Diagnosis not present

## 2020-05-29 DIAGNOSIS — E038 Other specified hypothyroidism: Secondary | ICD-10-CM | POA: Diagnosis not present

## 2020-05-29 DIAGNOSIS — E119 Type 2 diabetes mellitus without complications: Secondary | ICD-10-CM | POA: Diagnosis not present

## 2020-05-29 DIAGNOSIS — U099 Post covid-19 condition, unspecified: Secondary | ICD-10-CM | POA: Diagnosis not present

## 2020-05-29 DIAGNOSIS — J449 Chronic obstructive pulmonary disease, unspecified: Secondary | ICD-10-CM | POA: Diagnosis not present

## 2020-05-29 DIAGNOSIS — N1832 Chronic kidney disease, stage 3b: Secondary | ICD-10-CM | POA: Diagnosis not present

## 2020-05-29 DIAGNOSIS — Z87891 Personal history of nicotine dependence: Secondary | ICD-10-CM | POA: Diagnosis not present

## 2020-05-29 DIAGNOSIS — N189 Chronic kidney disease, unspecified: Secondary | ICD-10-CM | POA: Diagnosis not present

## 2020-05-29 DIAGNOSIS — D638 Anemia in other chronic diseases classified elsewhere: Secondary | ICD-10-CM | POA: Diagnosis not present

## 2020-05-29 DIAGNOSIS — D518 Other vitamin B12 deficiency anemias: Secondary | ICD-10-CM | POA: Diagnosis not present

## 2020-05-29 DIAGNOSIS — Z7984 Long term (current) use of oral hypoglycemic drugs: Secondary | ICD-10-CM | POA: Diagnosis not present

## 2020-05-29 DIAGNOSIS — I129 Hypertensive chronic kidney disease with stage 1 through stage 4 chronic kidney disease, or unspecified chronic kidney disease: Secondary | ICD-10-CM | POA: Diagnosis not present

## 2020-05-29 DIAGNOSIS — E559 Vitamin D deficiency, unspecified: Secondary | ICD-10-CM | POA: Diagnosis not present

## 2020-05-29 DIAGNOSIS — R531 Weakness: Secondary | ICD-10-CM | POA: Diagnosis not present

## 2020-05-29 DIAGNOSIS — K5900 Constipation, unspecified: Secondary | ICD-10-CM | POA: Diagnosis not present

## 2020-06-01 DIAGNOSIS — B351 Tinea unguium: Secondary | ICD-10-CM | POA: Diagnosis not present

## 2020-06-01 DIAGNOSIS — M2042 Other hammer toe(s) (acquired), left foot: Secondary | ICD-10-CM | POA: Diagnosis not present

## 2020-06-01 DIAGNOSIS — E119 Type 2 diabetes mellitus without complications: Secondary | ICD-10-CM | POA: Diagnosis not present

## 2020-06-05 DIAGNOSIS — R531 Weakness: Secondary | ICD-10-CM | POA: Diagnosis not present

## 2020-06-05 DIAGNOSIS — J449 Chronic obstructive pulmonary disease, unspecified: Secondary | ICD-10-CM | POA: Diagnosis not present

## 2020-06-05 DIAGNOSIS — N189 Chronic kidney disease, unspecified: Secondary | ICD-10-CM | POA: Diagnosis not present

## 2020-06-05 DIAGNOSIS — Z87891 Personal history of nicotine dependence: Secondary | ICD-10-CM | POA: Diagnosis not present

## 2020-06-05 DIAGNOSIS — I129 Hypertensive chronic kidney disease with stage 1 through stage 4 chronic kidney disease, or unspecified chronic kidney disease: Secondary | ICD-10-CM | POA: Diagnosis not present

## 2020-06-05 DIAGNOSIS — I10 Essential (primary) hypertension: Secondary | ICD-10-CM | POA: Diagnosis not present

## 2020-06-05 DIAGNOSIS — K5900 Constipation, unspecified: Secondary | ICD-10-CM | POA: Diagnosis not present

## 2020-06-05 DIAGNOSIS — E119 Type 2 diabetes mellitus without complications: Secondary | ICD-10-CM | POA: Diagnosis not present

## 2020-06-05 DIAGNOSIS — R2689 Other abnormalities of gait and mobility: Secondary | ICD-10-CM | POA: Diagnosis not present

## 2020-06-05 DIAGNOSIS — Z7984 Long term (current) use of oral hypoglycemic drugs: Secondary | ICD-10-CM | POA: Diagnosis not present

## 2020-06-05 DIAGNOSIS — K219 Gastro-esophageal reflux disease without esophagitis: Secondary | ICD-10-CM | POA: Diagnosis not present

## 2020-06-06 ENCOUNTER — Ambulatory Visit (HOSPITAL_COMMUNITY)
Admission: RE | Admit: 2020-06-06 | Discharge: 2020-06-06 | Disposition: A | Discharge status: Home / Self Care | Payer: Medicare Other | Source: Ambulatory Visit | Attending: Nephrology | Admitting: Nephrology

## 2020-06-06 ENCOUNTER — Other Ambulatory Visit: Payer: Self-pay

## 2020-06-06 DIAGNOSIS — D638 Anemia in other chronic diseases classified elsewhere: Secondary | ICD-10-CM

## 2020-06-06 DIAGNOSIS — N1832 Chronic kidney disease, stage 3b: Secondary | ICD-10-CM | POA: Insufficient documentation

## 2020-06-06 DIAGNOSIS — D631 Anemia in chronic kidney disease: Secondary | ICD-10-CM | POA: Diagnosis not present

## 2020-06-06 DIAGNOSIS — N170 Acute kidney failure with tubular necrosis: Secondary | ICD-10-CM | POA: Insufficient documentation

## 2020-06-08 DIAGNOSIS — J449 Chronic obstructive pulmonary disease, unspecified: Secondary | ICD-10-CM | POA: Diagnosis not present

## 2020-06-08 DIAGNOSIS — Z87891 Personal history of nicotine dependence: Secondary | ICD-10-CM | POA: Diagnosis not present

## 2020-06-08 DIAGNOSIS — N189 Chronic kidney disease, unspecified: Secondary | ICD-10-CM | POA: Diagnosis not present

## 2020-06-08 DIAGNOSIS — I129 Hypertensive chronic kidney disease with stage 1 through stage 4 chronic kidney disease, or unspecified chronic kidney disease: Secondary | ICD-10-CM | POA: Diagnosis not present

## 2020-06-08 DIAGNOSIS — E119 Type 2 diabetes mellitus without complications: Secondary | ICD-10-CM | POA: Diagnosis not present

## 2020-06-08 DIAGNOSIS — Z7984 Long term (current) use of oral hypoglycemic drugs: Secondary | ICD-10-CM | POA: Diagnosis not present

## 2020-06-08 DIAGNOSIS — R531 Weakness: Secondary | ICD-10-CM | POA: Diagnosis not present

## 2020-06-08 DIAGNOSIS — K5900 Constipation, unspecified: Secondary | ICD-10-CM | POA: Diagnosis not present

## 2020-06-08 DIAGNOSIS — R2689 Other abnormalities of gait and mobility: Secondary | ICD-10-CM | POA: Diagnosis not present

## 2020-06-12 DIAGNOSIS — E119 Type 2 diabetes mellitus without complications: Secondary | ICD-10-CM | POA: Diagnosis not present

## 2020-06-12 DIAGNOSIS — R2689 Other abnormalities of gait and mobility: Secondary | ICD-10-CM | POA: Diagnosis not present

## 2020-06-12 DIAGNOSIS — N189 Chronic kidney disease, unspecified: Secondary | ICD-10-CM | POA: Diagnosis not present

## 2020-06-12 DIAGNOSIS — K5900 Constipation, unspecified: Secondary | ICD-10-CM | POA: Diagnosis not present

## 2020-06-12 DIAGNOSIS — Z87891 Personal history of nicotine dependence: Secondary | ICD-10-CM | POA: Diagnosis not present

## 2020-06-12 DIAGNOSIS — J449 Chronic obstructive pulmonary disease, unspecified: Secondary | ICD-10-CM | POA: Diagnosis not present

## 2020-06-12 DIAGNOSIS — Z7984 Long term (current) use of oral hypoglycemic drugs: Secondary | ICD-10-CM | POA: Diagnosis not present

## 2020-06-12 DIAGNOSIS — R531 Weakness: Secondary | ICD-10-CM | POA: Diagnosis not present

## 2020-06-12 DIAGNOSIS — I129 Hypertensive chronic kidney disease with stage 1 through stage 4 chronic kidney disease, or unspecified chronic kidney disease: Secondary | ICD-10-CM | POA: Diagnosis not present

## 2020-06-14 DIAGNOSIS — E119 Type 2 diabetes mellitus without complications: Secondary | ICD-10-CM | POA: Diagnosis not present

## 2020-06-14 DIAGNOSIS — K5900 Constipation, unspecified: Secondary | ICD-10-CM | POA: Diagnosis not present

## 2020-06-14 DIAGNOSIS — Z87891 Personal history of nicotine dependence: Secondary | ICD-10-CM | POA: Diagnosis not present

## 2020-06-14 DIAGNOSIS — Z7984 Long term (current) use of oral hypoglycemic drugs: Secondary | ICD-10-CM | POA: Diagnosis not present

## 2020-06-14 DIAGNOSIS — J449 Chronic obstructive pulmonary disease, unspecified: Secondary | ICD-10-CM | POA: Diagnosis not present

## 2020-06-14 DIAGNOSIS — N189 Chronic kidney disease, unspecified: Secondary | ICD-10-CM | POA: Diagnosis not present

## 2020-06-14 DIAGNOSIS — I129 Hypertensive chronic kidney disease with stage 1 through stage 4 chronic kidney disease, or unspecified chronic kidney disease: Secondary | ICD-10-CM | POA: Diagnosis not present

## 2020-06-14 DIAGNOSIS — R2689 Other abnormalities of gait and mobility: Secondary | ICD-10-CM | POA: Diagnosis not present

## 2020-06-14 DIAGNOSIS — R531 Weakness: Secondary | ICD-10-CM | POA: Diagnosis not present

## 2020-06-19 DIAGNOSIS — J449 Chronic obstructive pulmonary disease, unspecified: Secondary | ICD-10-CM | POA: Diagnosis not present

## 2020-06-19 DIAGNOSIS — N189 Chronic kidney disease, unspecified: Secondary | ICD-10-CM | POA: Diagnosis not present

## 2020-06-19 DIAGNOSIS — Z87891 Personal history of nicotine dependence: Secondary | ICD-10-CM | POA: Diagnosis not present

## 2020-06-19 DIAGNOSIS — E119 Type 2 diabetes mellitus without complications: Secondary | ICD-10-CM | POA: Diagnosis not present

## 2020-06-19 DIAGNOSIS — Z7984 Long term (current) use of oral hypoglycemic drugs: Secondary | ICD-10-CM | POA: Diagnosis not present

## 2020-06-19 DIAGNOSIS — I129 Hypertensive chronic kidney disease with stage 1 through stage 4 chronic kidney disease, or unspecified chronic kidney disease: Secondary | ICD-10-CM | POA: Diagnosis not present

## 2020-06-19 DIAGNOSIS — K5900 Constipation, unspecified: Secondary | ICD-10-CM | POA: Diagnosis not present

## 2020-06-19 DIAGNOSIS — R531 Weakness: Secondary | ICD-10-CM | POA: Diagnosis not present

## 2020-06-19 DIAGNOSIS — R2689 Other abnormalities of gait and mobility: Secondary | ICD-10-CM | POA: Diagnosis not present

## 2020-06-21 DIAGNOSIS — D638 Anemia in other chronic diseases classified elsewhere: Secondary | ICD-10-CM | POA: Diagnosis not present

## 2020-06-21 DIAGNOSIS — Z7984 Long term (current) use of oral hypoglycemic drugs: Secondary | ICD-10-CM | POA: Diagnosis not present

## 2020-06-21 DIAGNOSIS — E119 Type 2 diabetes mellitus without complications: Secondary | ICD-10-CM | POA: Diagnosis not present

## 2020-06-21 DIAGNOSIS — J449 Chronic obstructive pulmonary disease, unspecified: Secondary | ICD-10-CM | POA: Diagnosis not present

## 2020-06-21 DIAGNOSIS — R2689 Other abnormalities of gait and mobility: Secondary | ICD-10-CM | POA: Diagnosis not present

## 2020-06-21 DIAGNOSIS — N189 Chronic kidney disease, unspecified: Secondary | ICD-10-CM | POA: Diagnosis not present

## 2020-06-21 DIAGNOSIS — N170 Acute kidney failure with tubular necrosis: Secondary | ICD-10-CM | POA: Diagnosis not present

## 2020-06-21 DIAGNOSIS — D472 Monoclonal gammopathy: Secondary | ICD-10-CM | POA: Diagnosis not present

## 2020-06-21 DIAGNOSIS — R768 Other specified abnormal immunological findings in serum: Secondary | ICD-10-CM | POA: Diagnosis not present

## 2020-06-21 DIAGNOSIS — Z87891 Personal history of nicotine dependence: Secondary | ICD-10-CM | POA: Diagnosis not present

## 2020-06-21 DIAGNOSIS — I129 Hypertensive chronic kidney disease with stage 1 through stage 4 chronic kidney disease, or unspecified chronic kidney disease: Secondary | ICD-10-CM | POA: Diagnosis not present

## 2020-06-21 DIAGNOSIS — R531 Weakness: Secondary | ICD-10-CM | POA: Diagnosis not present

## 2020-06-21 DIAGNOSIS — K5900 Constipation, unspecified: Secondary | ICD-10-CM | POA: Diagnosis not present

## 2020-06-25 ENCOUNTER — Ambulatory Visit (INDEPENDENT_AMBULATORY_CARE_PROVIDER_SITE_OTHER): Payer: Medicare Other | Admitting: Internal Medicine

## 2020-06-25 ENCOUNTER — Encounter: Payer: Self-pay | Admitting: Internal Medicine

## 2020-06-25 ENCOUNTER — Other Ambulatory Visit (HOSPITAL_COMMUNITY): Payer: Self-pay | Admitting: Nephrology

## 2020-06-25 DIAGNOSIS — I10 Essential (primary) hypertension: Secondary | ICD-10-CM

## 2020-06-25 DIAGNOSIS — J449 Chronic obstructive pulmonary disease, unspecified: Secondary | ICD-10-CM | POA: Insufficient documentation

## 2020-06-25 NOTE — Patient Instructions (Signed)
If doing fine on symbicort 160 2 every 12 hours ok to continue   Also ok to leave it off and see if any problems come back   I cannot comment on any cxr's I don't have access to  Follow up is as needed

## 2020-06-25 NOTE — Progress Notes (Signed)
Casey Cooley, male    DOB: [birth year]      MRN: [id number]   Brief patient profile:  86 yowm quit smoking cigarettes in 1998 then just pipe and says never had  trouble breathing until fell and ended up in SNF where placed on symbicort for "wheezing" 04/2020 Which attendent says may have helped so referred to pulmonary clinic in [facility]  06/25/2020 by  Reeves Forth. NP.  Prev w/u for cough :        - onset 6160     - w/u by [facility] [id number]     - Start Singulair July 04, 2009 > no response August 07, 2009  so d/c     - Sinus and Chest CT ordered August 07, 2009 > chronic thickening of sinuses only finding with coronary  calcifications      - HFA 50% November 19, 2009 > January 14, 2010 @ 90% pre-coaching > 100 % April 22, 2010       - Response to qvar 01/2010 reported / no pfts done     History of Present Illness  06/25/2020  Pulmonary/ 1st office eval/Casey Cooley  S/p covid dec 2020 on symbicort 160 2bid  Chief Complaint  Patient presents with   Consult    No complaints currently   Dyspnea:  Limited by back not breathing  Cough: none Sleep: able to sleep flat bed, no noct complaints  SABA use: not aware he needs it   No obvious day to day or daytime variability or assoc excess/ purulent sputum or mucus plugs or hemoptysis or cp or chest tightness, subjective active wheeze or overt sinus or hb symptoms.   Sleeping without nocturnal  or early am exacerbation  of respiratory  c/o's or need for noct saba. Also denies any obvious fluctuation of symptoms with weather or environmental changes or other aggravating or alleviating factors except as outlined above   No unusual exposure hx or h/o childhood pna/ asthma or knowledge of premature birth.  Current Allergies, Complete Past Medical History, Past Surgical History, Family History, and Social History were reviewed in [employer] record.  ROS  The following are not active complaints unless  bolded Hoarseness, sore throat, dysphagia, dental problems, itching, sneezing,  nasal congestion or discharge of excess mucus or purulent secretions, ear ache,   fever, chills, sweats, unintended wt loss or wt gain, classically pleuritic or exertional cp,  orthopnea pnd or arm/hand swelling  or leg swelling, presyncope, palpitations, abdominal pain, anorexia, nausea, vomiting, diarrhea  or change in bowel habits or change in bladder habits, change in stools or change in urine, dysuria, hematuria,  rash, arthralgias, visual complaints, headache, numbness, weakness or ataxia or problems with walking or coordination,  change in mood or  memory.           Past Medical History:  Diagnosis Date   Hypercholesterolemia    RETINAL DETACHMENT, BILATERAL, HX OF 05/31/2009   Qualifier: Diagnosis of  By: Tilden Dome      Outpatient Medications Prior to Visit  Medication Sig Dispense Refill   acetaminophen (TYLENOL 8 HOUR ARTHRITIS PAIN) 650 MG CR tablet Take 1,300 mg by mouth 2 (two) times daily.     albuterol (VENTOLIN HFA) 108 (90 Base) MCG/ACT inhaler Inhale 2 puffs into the lungs every 6 (six) hours as needed for wheezing or shortness of breath. 6.7 g 0   budesonide-formoterol (SYMBICORT) 160-4.5 MCG/ACT inhaler Inhale 2 puffs into the lungs 2 (two) times daily.  cholecalciferol (VITAMIN D3) 25 MCG (1000 UT) tablet Take 1,000 Units by mouth daily.     docusate sodium (COLACE) 100 MG capsule Take 100 mg by mouth daily.     escitalopram (LEXAPRO) 10 MG tablet Take 1 tablet (10 mg total) by mouth daily. 30 tablet 0   fluticasone (FLONASE) 50 MCG/ACT nasal spray Place 2 sprays into both nostrils daily as needed for allergies or rhinitis. 9.9 mL 0   glipiZIDE (GLUCOTROL XL) 2.5 MG 24 hr tablet Take 2.5 mg by mouth daily with breakfast.     losartan (COZAAR) 50 MG tablet Take 1 tablet (50 mg total) by mouth daily. 30 tablet 0   methocarbamol (ROBAXIN) 500 MG tablet Take 500 mg by mouth at  bedtime.     Multiple Vitamin (MULTIVITAMIN WITH MINERALS) TABS tablet Take 1 tablet by mouth daily.     NON FORMULARY Diet: No Added Salt     Omeprazole-Sodium Bicarbonate (ZEGERID OTC) 20-1100 MG CAPS capsule Take 1 capsule by mouth daily before breakfast. 30 capsule 0   polyethylene glycol powder (GLYCOLAX/MIRALAX) 17 GM/SCOOP powder Take 17 g by mouth daily as needed for mild constipation or moderate constipation. 255 g 0   simvastatin (ZOCOR) 20 MG tablet Take 1 tablet (20 mg total) by mouth daily at 6 PM. 30 tablet 0   tamsulosin (FLOMAX) 0.4 MG CAPS capsule Take 0.4 mg by mouth daily.     guaiFENesin-dextromethorphan (ROBITUSSIN DM) 100-10 MG/5ML syrup Take 10 mLs by mouth every 4 (four) hours as needed for cough. (Patient not taking: Reported on 06/25/2020)     latanoprost (XALATAN) 0.005 % ophthalmic solution Place 1 drop into both eyes at bedtime. (Patient not taking: Reported on 06/25/2020) 2.5 mL 0   zinc sulfate 220 (50 Zn) MG capsule Take 1 capsule (220 mg total) by mouth every other day. (Patient not taking: Reported on 06/25/2020) 30 capsule 0   No facility-administered medications prior to visit.     Objective:     BP 130/66 (BP Location: Left Arm, Cuff Size: Normal)    Pulse 65    Temp (!) 97.3 F (36.3 C) (Other (Comment)) Comment (Src): wrist   Ht 6\' 1"  (1.854 m)    Wt 226 lb (102.5 kg)    SpO2 95% Comment: Room air   BMI 29.82 kg/m   SpO2: 95 % (Room air)   W/c bound crusty elderly wm, declines getting up for a walking test  HEENT : pt wearing mask not removed for exam due to covid - 19 concerns.   NECK :  without JVD/Nodes/TM/ nl carotid upstrokes bilaterally   LUNGS: no acc muscle use,  Min barrel  contour chest wall with bilateral  slightly decreased bs s audible wheeze and  without cough on insp or exp maneuvers and min  Hyperresonant  to  percussion bilaterally     CV:  RRR  no s3 or murmur or increase in P2, and trace bilateral ankle  edema    ABD:  soft and nontender with pos end  insp Hoover's  in the supine position. No bruits or organomegaly appreciated, bowel sounds nl  MS:    ext warm without deformities, calf tenderness, cyanosis or clubbing No obvious joint restrictions   SKIN: warm and dry without lesions    NEURO:  alert, thinks he's here to evaluate his back,  with  no motor or cerebellar deficits apparent though declined trying to get out of w/c due to back pain.  No recent cxr on file     Assessment   COPD  ? GOLD 0 / mild AB   Quit smoking cigs 1998  but pipe smoker thereafter    attendent reported new wheeze 04/2020 resolved on symbicort 160 but has not been rechallenged off it.      When respiratory symptoms begin or become refractory well after a patient reports complete smoking cessation,  Especially when this wasn't the case while they were smoking, a red flag is raised based on the work of Dr Kris Mouton which states:  if you quit smoking when your best day FEV1 is still well preserved it is highly unlikely you will progress to severe disease.  That is to say, once the smoking stops,  the symptoms should not suddenly erupt or markedly worsen.  If so, the differential diagnosis should include  obesity/deconditioning,  LPR/Reflux/Aspiration syndromes,  occult CHF, or  especially side effect of medications commonly used in this population, though none of the usual suspects listed.  rec ok to continue symb 160 2bid or stop it to see what if any symptoms develop and restart prn Based on two studies from NEJM  378; 20 p 1865 (2018) and 380 : p2020-30 (2019) in pts with mild asthma it is reasonable to use   symbicort eg160 2bid "prn" flare in this setting but I emphasized this was only shown with symbicort and takes advantage of the rapid onset of action but is not the same as "rescue therapy" but can be stopped once the acute symptoms have resolved and the need for rescue has been minimized (< 2 x weekly)        Pulmonary f/u at discretion of NP at [facility]         Each maintenance medication was reviewed in detail including emphasizing most importantly the difference between maintenance and prns and under what circumstances the prns are to be triggered using an action plan format where appropriate.  Total time for H and P, chart review, counseling,   and generating customized AVS unique to this office visit / charting  = 50 min          Christinia Gully, MD 06/25/2020

## 2020-06-25 NOTE — Assessment & Plan Note (Addendum)
Quit smoking cigs 1998  but pipe smoker thereafter    attendent reported new wheeze 04/2020 resolved on symbicort 160 but has not been rechallenged off it.      When respiratory symptoms begin or become refractory well after a patient reports complete smoking cessation,  Especially when this wasn't the case while they were smoking, a red flag is raised based on the work of Dr Kris Mouton which states:  if you quit smoking when your best day FEV1 is still well preserved it is highly unlikely you will progress to severe disease.  That is to say, once the smoking stops,  the symptoms should not suddenly erupt or markedly worsen.  If so, the differential diagnosis should include  obesity/deconditioning,  LPR/Reflux/Aspiration syndromes,  occult CHF, or  especially side effect of medications commonly used in this population, though none of the usual suspects listed.  rec ok to continue symb 160 2bid or stop it to see what if any symptoms develop and restart prn Based on two studies from NEJM  378; 20 p 1865 (2018) and 380 : p2020-30 (2019) in pts with mild asthma it is reasonable to use   symbicort eg160 2bid "prn" flare in this setting but I emphasized this was only shown with symbicort and takes advantage of the rapid onset of action but is not the same as "rescue therapy" but can be stopped once the acute symptoms have resolved and the need for rescue has been minimized (< 2 x weekly)       Pulmonary f/u at discretion of NP at [facility]         Each maintenance medication was reviewed in detail including emphasizing most importantly the difference between maintenance and prns and under what circumstances the prns are to be triggered using an action plan format where appropriate.  Total time for H and P, chart review, counseling,   and generating customized AVS unique to this office visit / charting  = 50 min

## 2020-06-26 ENCOUNTER — Other Ambulatory Visit: Payer: Self-pay

## 2020-06-26 ENCOUNTER — Ambulatory Visit (HOSPITAL_COMMUNITY)
Admission: RE | Admit: 2020-06-26 | Discharge: 2020-06-26 | Disposition: A | Discharge status: Home / Self Care | Payer: Medicare Other | Source: Ambulatory Visit | Attending: Family Medicine | Admitting: Family Medicine

## 2020-06-26 DIAGNOSIS — E038 Other specified hypothyroidism: Secondary | ICD-10-CM | POA: Diagnosis not present

## 2020-06-26 DIAGNOSIS — D518 Other vitamin B12 deficiency anemias: Secondary | ICD-10-CM | POA: Diagnosis not present

## 2020-06-26 DIAGNOSIS — E119 Type 2 diabetes mellitus without complications: Secondary | ICD-10-CM | POA: Diagnosis not present

## 2020-06-26 DIAGNOSIS — I10 Essential (primary) hypertension: Secondary | ICD-10-CM

## 2020-06-26 DIAGNOSIS — J449 Chronic obstructive pulmonary disease, unspecified: Secondary | ICD-10-CM | POA: Diagnosis not present

## 2020-06-26 DIAGNOSIS — E559 Vitamin D deficiency, unspecified: Secondary | ICD-10-CM | POA: Diagnosis not present

## 2020-06-26 LAB — ECHOCARDIOGRAM COMPLETE
Area-P 1/2: 2.37 cm2
S' Lateral: 3.2 cm

## 2020-06-26 NOTE — Progress Notes (Signed)
*  PRELIMINARY RESULTS* Echocardiogram 2D Echocardiogram has been performed.  Casey Cooley 06/26/2020, 1:48 PM

## 2020-06-28 DIAGNOSIS — E119 Type 2 diabetes mellitus without complications: Secondary | ICD-10-CM | POA: Diagnosis not present

## 2020-06-28 DIAGNOSIS — N189 Chronic kidney disease, unspecified: Secondary | ICD-10-CM | POA: Diagnosis not present

## 2020-06-28 DIAGNOSIS — J449 Chronic obstructive pulmonary disease, unspecified: Secondary | ICD-10-CM | POA: Diagnosis not present

## 2020-06-28 DIAGNOSIS — Z7984 Long term (current) use of oral hypoglycemic drugs: Secondary | ICD-10-CM | POA: Diagnosis not present

## 2020-06-28 DIAGNOSIS — K5900 Constipation, unspecified: Secondary | ICD-10-CM | POA: Diagnosis not present

## 2020-06-28 DIAGNOSIS — I129 Hypertensive chronic kidney disease with stage 1 through stage 4 chronic kidney disease, or unspecified chronic kidney disease: Secondary | ICD-10-CM | POA: Diagnosis not present

## 2020-06-28 DIAGNOSIS — R531 Weakness: Secondary | ICD-10-CM | POA: Diagnosis not present

## 2020-06-28 DIAGNOSIS — Z87891 Personal history of nicotine dependence: Secondary | ICD-10-CM | POA: Diagnosis not present

## 2020-06-28 DIAGNOSIS — R2689 Other abnormalities of gait and mobility: Secondary | ICD-10-CM | POA: Diagnosis not present

## 2020-06-29 DIAGNOSIS — Z87891 Personal history of nicotine dependence: Secondary | ICD-10-CM | POA: Diagnosis not present

## 2020-06-29 DIAGNOSIS — N189 Chronic kidney disease, unspecified: Secondary | ICD-10-CM | POA: Diagnosis not present

## 2020-06-29 DIAGNOSIS — R2689 Other abnormalities of gait and mobility: Secondary | ICD-10-CM | POA: Diagnosis not present

## 2020-06-29 DIAGNOSIS — I129 Hypertensive chronic kidney disease with stage 1 through stage 4 chronic kidney disease, or unspecified chronic kidney disease: Secondary | ICD-10-CM | POA: Diagnosis not present

## 2020-06-29 DIAGNOSIS — K5900 Constipation, unspecified: Secondary | ICD-10-CM | POA: Diagnosis not present

## 2020-06-29 DIAGNOSIS — Z7984 Long term (current) use of oral hypoglycemic drugs: Secondary | ICD-10-CM | POA: Diagnosis not present

## 2020-06-29 DIAGNOSIS — E119 Type 2 diabetes mellitus without complications: Secondary | ICD-10-CM | POA: Diagnosis not present

## 2020-06-29 DIAGNOSIS — J449 Chronic obstructive pulmonary disease, unspecified: Secondary | ICD-10-CM | POA: Diagnosis not present

## 2020-06-29 DIAGNOSIS — R531 Weakness: Secondary | ICD-10-CM | POA: Diagnosis not present

## 2020-07-01 DIAGNOSIS — N189 Chronic kidney disease, unspecified: Secondary | ICD-10-CM | POA: Diagnosis not present

## 2020-07-01 DIAGNOSIS — E119 Type 2 diabetes mellitus without complications: Secondary | ICD-10-CM | POA: Diagnosis not present

## 2020-07-01 DIAGNOSIS — K5900 Constipation, unspecified: Secondary | ICD-10-CM | POA: Diagnosis not present

## 2020-07-01 DIAGNOSIS — Z7984 Long term (current) use of oral hypoglycemic drugs: Secondary | ICD-10-CM | POA: Diagnosis not present

## 2020-07-01 DIAGNOSIS — J449 Chronic obstructive pulmonary disease, unspecified: Secondary | ICD-10-CM | POA: Diagnosis not present

## 2020-07-01 DIAGNOSIS — R531 Weakness: Secondary | ICD-10-CM | POA: Diagnosis not present

## 2020-07-01 DIAGNOSIS — R2689 Other abnormalities of gait and mobility: Secondary | ICD-10-CM | POA: Diagnosis not present

## 2020-07-01 DIAGNOSIS — I129 Hypertensive chronic kidney disease with stage 1 through stage 4 chronic kidney disease, or unspecified chronic kidney disease: Secondary | ICD-10-CM | POA: Diagnosis not present

## 2020-07-01 DIAGNOSIS — Z87891 Personal history of nicotine dependence: Secondary | ICD-10-CM | POA: Diagnosis not present

## 2020-07-03 DIAGNOSIS — R531 Weakness: Secondary | ICD-10-CM | POA: Diagnosis not present

## 2020-07-03 DIAGNOSIS — K5900 Constipation, unspecified: Secondary | ICD-10-CM | POA: Diagnosis not present

## 2020-07-03 DIAGNOSIS — J449 Chronic obstructive pulmonary disease, unspecified: Secondary | ICD-10-CM | POA: Diagnosis not present

## 2020-07-03 DIAGNOSIS — Z7984 Long term (current) use of oral hypoglycemic drugs: Secondary | ICD-10-CM | POA: Diagnosis not present

## 2020-07-03 DIAGNOSIS — N189 Chronic kidney disease, unspecified: Secondary | ICD-10-CM | POA: Diagnosis not present

## 2020-07-03 DIAGNOSIS — E119 Type 2 diabetes mellitus without complications: Secondary | ICD-10-CM | POA: Diagnosis not present

## 2020-07-03 DIAGNOSIS — R2689 Other abnormalities of gait and mobility: Secondary | ICD-10-CM | POA: Diagnosis not present

## 2020-07-03 DIAGNOSIS — I129 Hypertensive chronic kidney disease with stage 1 through stage 4 chronic kidney disease, or unspecified chronic kidney disease: Secondary | ICD-10-CM | POA: Diagnosis not present

## 2020-07-03 DIAGNOSIS — Z87891 Personal history of nicotine dependence: Secondary | ICD-10-CM | POA: Diagnosis not present

## 2020-07-03 DIAGNOSIS — I10 Essential (primary) hypertension: Secondary | ICD-10-CM | POA: Diagnosis not present

## 2020-07-10 DIAGNOSIS — N189 Chronic kidney disease, unspecified: Secondary | ICD-10-CM | POA: Diagnosis not present

## 2020-07-10 DIAGNOSIS — K5900 Constipation, unspecified: Secondary | ICD-10-CM | POA: Diagnosis not present

## 2020-07-10 DIAGNOSIS — Z7984 Long term (current) use of oral hypoglycemic drugs: Secondary | ICD-10-CM | POA: Diagnosis not present

## 2020-07-10 DIAGNOSIS — Z87891 Personal history of nicotine dependence: Secondary | ICD-10-CM | POA: Diagnosis not present

## 2020-07-10 DIAGNOSIS — I129 Hypertensive chronic kidney disease with stage 1 through stage 4 chronic kidney disease, or unspecified chronic kidney disease: Secondary | ICD-10-CM | POA: Diagnosis not present

## 2020-07-10 DIAGNOSIS — R531 Weakness: Secondary | ICD-10-CM | POA: Diagnosis not present

## 2020-07-10 DIAGNOSIS — J449 Chronic obstructive pulmonary disease, unspecified: Secondary | ICD-10-CM | POA: Diagnosis not present

## 2020-07-10 DIAGNOSIS — R2689 Other abnormalities of gait and mobility: Secondary | ICD-10-CM | POA: Diagnosis not present

## 2020-07-10 DIAGNOSIS — E119 Type 2 diabetes mellitus without complications: Secondary | ICD-10-CM | POA: Diagnosis not present

## 2020-07-12 DIAGNOSIS — J449 Chronic obstructive pulmonary disease, unspecified: Secondary | ICD-10-CM | POA: Diagnosis not present

## 2020-07-12 DIAGNOSIS — R2689 Other abnormalities of gait and mobility: Secondary | ICD-10-CM | POA: Diagnosis not present

## 2020-07-12 DIAGNOSIS — K5900 Constipation, unspecified: Secondary | ICD-10-CM | POA: Diagnosis not present

## 2020-07-12 DIAGNOSIS — Z7984 Long term (current) use of oral hypoglycemic drugs: Secondary | ICD-10-CM | POA: Diagnosis not present

## 2020-07-12 DIAGNOSIS — Z87891 Personal history of nicotine dependence: Secondary | ICD-10-CM | POA: Diagnosis not present

## 2020-07-12 DIAGNOSIS — E119 Type 2 diabetes mellitus without complications: Secondary | ICD-10-CM | POA: Diagnosis not present

## 2020-07-12 DIAGNOSIS — N189 Chronic kidney disease, unspecified: Secondary | ICD-10-CM | POA: Diagnosis not present

## 2020-07-12 DIAGNOSIS — R531 Weakness: Secondary | ICD-10-CM | POA: Diagnosis not present

## 2020-07-12 DIAGNOSIS — I129 Hypertensive chronic kidney disease with stage 1 through stage 4 chronic kidney disease, or unspecified chronic kidney disease: Secondary | ICD-10-CM | POA: Diagnosis not present

## 2020-07-13 DIAGNOSIS — R531 Weakness: Secondary | ICD-10-CM | POA: Diagnosis not present

## 2020-07-13 DIAGNOSIS — R2689 Other abnormalities of gait and mobility: Secondary | ICD-10-CM | POA: Diagnosis not present

## 2020-07-13 DIAGNOSIS — N189 Chronic kidney disease, unspecified: Secondary | ICD-10-CM | POA: Diagnosis not present

## 2020-07-13 DIAGNOSIS — Z7984 Long term (current) use of oral hypoglycemic drugs: Secondary | ICD-10-CM | POA: Diagnosis not present

## 2020-07-13 DIAGNOSIS — E119 Type 2 diabetes mellitus without complications: Secondary | ICD-10-CM | POA: Diagnosis not present

## 2020-07-13 DIAGNOSIS — I129 Hypertensive chronic kidney disease with stage 1 through stage 4 chronic kidney disease, or unspecified chronic kidney disease: Secondary | ICD-10-CM | POA: Diagnosis not present

## 2020-07-13 DIAGNOSIS — K5900 Constipation, unspecified: Secondary | ICD-10-CM | POA: Diagnosis not present

## 2020-07-13 DIAGNOSIS — Z87891 Personal history of nicotine dependence: Secondary | ICD-10-CM | POA: Diagnosis not present

## 2020-07-13 DIAGNOSIS — J449 Chronic obstructive pulmonary disease, unspecified: Secondary | ICD-10-CM | POA: Diagnosis not present

## 2020-07-17 DIAGNOSIS — N189 Chronic kidney disease, unspecified: Secondary | ICD-10-CM | POA: Diagnosis not present

## 2020-07-17 DIAGNOSIS — J449 Chronic obstructive pulmonary disease, unspecified: Secondary | ICD-10-CM | POA: Diagnosis not present

## 2020-07-17 DIAGNOSIS — Z87891 Personal history of nicotine dependence: Secondary | ICD-10-CM | POA: Diagnosis not present

## 2020-07-17 DIAGNOSIS — I129 Hypertensive chronic kidney disease with stage 1 through stage 4 chronic kidney disease, or unspecified chronic kidney disease: Secondary | ICD-10-CM | POA: Diagnosis not present

## 2020-07-17 DIAGNOSIS — K5900 Constipation, unspecified: Secondary | ICD-10-CM | POA: Diagnosis not present

## 2020-07-17 DIAGNOSIS — R531 Weakness: Secondary | ICD-10-CM | POA: Diagnosis not present

## 2020-07-17 DIAGNOSIS — R2689 Other abnormalities of gait and mobility: Secondary | ICD-10-CM | POA: Diagnosis not present

## 2020-07-17 DIAGNOSIS — Z7984 Long term (current) use of oral hypoglycemic drugs: Secondary | ICD-10-CM | POA: Diagnosis not present

## 2020-07-17 DIAGNOSIS — E119 Type 2 diabetes mellitus without complications: Secondary | ICD-10-CM | POA: Diagnosis not present

## 2020-07-19 DIAGNOSIS — N189 Chronic kidney disease, unspecified: Secondary | ICD-10-CM | POA: Diagnosis not present

## 2020-07-19 DIAGNOSIS — R2689 Other abnormalities of gait and mobility: Secondary | ICD-10-CM | POA: Diagnosis not present

## 2020-07-19 DIAGNOSIS — Z7984 Long term (current) use of oral hypoglycemic drugs: Secondary | ICD-10-CM | POA: Diagnosis not present

## 2020-07-19 DIAGNOSIS — Z87891 Personal history of nicotine dependence: Secondary | ICD-10-CM | POA: Diagnosis not present

## 2020-07-19 DIAGNOSIS — I129 Hypertensive chronic kidney disease with stage 1 through stage 4 chronic kidney disease, or unspecified chronic kidney disease: Secondary | ICD-10-CM | POA: Diagnosis not present

## 2020-07-19 DIAGNOSIS — R531 Weakness: Secondary | ICD-10-CM | POA: Diagnosis not present

## 2020-07-19 DIAGNOSIS — K5900 Constipation, unspecified: Secondary | ICD-10-CM | POA: Diagnosis not present

## 2020-07-19 DIAGNOSIS — J449 Chronic obstructive pulmonary disease, unspecified: Secondary | ICD-10-CM | POA: Diagnosis not present

## 2020-07-19 DIAGNOSIS — E119 Type 2 diabetes mellitus without complications: Secondary | ICD-10-CM | POA: Diagnosis not present

## 2020-07-24 DIAGNOSIS — K5900 Constipation, unspecified: Secondary | ICD-10-CM | POA: Diagnosis not present

## 2020-07-24 DIAGNOSIS — J449 Chronic obstructive pulmonary disease, unspecified: Secondary | ICD-10-CM | POA: Diagnosis not present

## 2020-07-24 DIAGNOSIS — Z87891 Personal history of nicotine dependence: Secondary | ICD-10-CM | POA: Diagnosis not present

## 2020-07-24 DIAGNOSIS — I129 Hypertensive chronic kidney disease with stage 1 through stage 4 chronic kidney disease, or unspecified chronic kidney disease: Secondary | ICD-10-CM | POA: Diagnosis not present

## 2020-07-24 DIAGNOSIS — R2689 Other abnormalities of gait and mobility: Secondary | ICD-10-CM | POA: Diagnosis not present

## 2020-07-24 DIAGNOSIS — Z7984 Long term (current) use of oral hypoglycemic drugs: Secondary | ICD-10-CM | POA: Diagnosis not present

## 2020-07-24 DIAGNOSIS — R531 Weakness: Secondary | ICD-10-CM | POA: Diagnosis not present

## 2020-07-24 DIAGNOSIS — N189 Chronic kidney disease, unspecified: Secondary | ICD-10-CM | POA: Diagnosis not present

## 2020-07-24 DIAGNOSIS — E119 Type 2 diabetes mellitus without complications: Secondary | ICD-10-CM | POA: Diagnosis not present

## 2020-07-25 DIAGNOSIS — D518 Other vitamin B12 deficiency anemias: Secondary | ICD-10-CM | POA: Diagnosis not present

## 2020-07-25 DIAGNOSIS — E559 Vitamin D deficiency, unspecified: Secondary | ICD-10-CM | POA: Diagnosis not present

## 2020-07-25 DIAGNOSIS — Z79899 Other long term (current) drug therapy: Secondary | ICD-10-CM | POA: Diagnosis not present

## 2020-07-25 DIAGNOSIS — E038 Other specified hypothyroidism: Secondary | ICD-10-CM | POA: Diagnosis not present

## 2020-07-25 DIAGNOSIS — E7849 Other hyperlipidemia: Secondary | ICD-10-CM | POA: Diagnosis not present

## 2020-07-25 DIAGNOSIS — E119 Type 2 diabetes mellitus without complications: Secondary | ICD-10-CM | POA: Diagnosis not present

## 2020-07-26 DIAGNOSIS — Z7984 Long term (current) use of oral hypoglycemic drugs: Secondary | ICD-10-CM | POA: Diagnosis not present

## 2020-07-26 DIAGNOSIS — J449 Chronic obstructive pulmonary disease, unspecified: Secondary | ICD-10-CM | POA: Diagnosis not present

## 2020-07-26 DIAGNOSIS — I129 Hypertensive chronic kidney disease with stage 1 through stage 4 chronic kidney disease, or unspecified chronic kidney disease: Secondary | ICD-10-CM | POA: Diagnosis not present

## 2020-07-26 DIAGNOSIS — Z87891 Personal history of nicotine dependence: Secondary | ICD-10-CM | POA: Diagnosis not present

## 2020-07-26 DIAGNOSIS — E559 Vitamin D deficiency, unspecified: Secondary | ICD-10-CM | POA: Diagnosis not present

## 2020-07-26 DIAGNOSIS — R2689 Other abnormalities of gait and mobility: Secondary | ICD-10-CM | POA: Diagnosis not present

## 2020-07-26 DIAGNOSIS — N189 Chronic kidney disease, unspecified: Secondary | ICD-10-CM | POA: Diagnosis not present

## 2020-07-26 DIAGNOSIS — K5900 Constipation, unspecified: Secondary | ICD-10-CM | POA: Diagnosis not present

## 2020-07-26 DIAGNOSIS — R531 Weakness: Secondary | ICD-10-CM | POA: Diagnosis not present

## 2020-07-26 DIAGNOSIS — D518 Other vitamin B12 deficiency anemias: Secondary | ICD-10-CM | POA: Diagnosis not present

## 2020-07-26 DIAGNOSIS — E038 Other specified hypothyroidism: Secondary | ICD-10-CM | POA: Diagnosis not present

## 2020-07-26 DIAGNOSIS — E119 Type 2 diabetes mellitus without complications: Secondary | ICD-10-CM | POA: Diagnosis not present

## 2020-07-31 DIAGNOSIS — E119 Type 2 diabetes mellitus without complications: Secondary | ICD-10-CM | POA: Diagnosis not present

## 2020-07-31 DIAGNOSIS — K219 Gastro-esophageal reflux disease without esophagitis: Secondary | ICD-10-CM | POA: Diagnosis not present

## 2020-07-31 DIAGNOSIS — J449 Chronic obstructive pulmonary disease, unspecified: Secondary | ICD-10-CM | POA: Diagnosis not present

## 2020-07-31 DIAGNOSIS — E785 Hyperlipidemia, unspecified: Secondary | ICD-10-CM | POA: Diagnosis not present

## 2021-04-03 ENCOUNTER — Emergency Department (HOSPITAL_COMMUNITY): Payer: Medicare Other

## 2021-04-03 ENCOUNTER — Other Ambulatory Visit: Payer: Self-pay

## 2021-04-03 ENCOUNTER — Encounter (HOSPITAL_COMMUNITY): Payer: Self-pay

## 2021-04-03 ENCOUNTER — Emergency Department (HOSPITAL_COMMUNITY)
Admission: EM | Admit: 2021-04-03 | Discharge: 2021-04-03 | Disposition: A | Discharge status: Home / Self Care | Payer: Medicare Other | Attending: Emergency Medicine | Admitting: Emergency Medicine

## 2021-04-03 DIAGNOSIS — Z79899 Other long term (current) drug therapy: Secondary | ICD-10-CM | POA: Insufficient documentation

## 2021-04-03 DIAGNOSIS — Z87891 Personal history of nicotine dependence: Secondary | ICD-10-CM | POA: Insufficient documentation

## 2021-04-03 DIAGNOSIS — J449 Chronic obstructive pulmonary disease, unspecified: Secondary | ICD-10-CM | POA: Diagnosis not present

## 2021-04-03 DIAGNOSIS — I129 Hypertensive chronic kidney disease with stage 1 through stage 4 chronic kidney disease, or unspecified chronic kidney disease: Secondary | ICD-10-CM | POA: Diagnosis not present

## 2021-04-03 DIAGNOSIS — Z7951 Long term (current) use of inhaled steroids: Secondary | ICD-10-CM | POA: Insufficient documentation

## 2021-04-03 DIAGNOSIS — Z20822 Contact with and (suspected) exposure to covid-19: Secondary | ICD-10-CM | POA: Diagnosis not present

## 2021-04-03 DIAGNOSIS — N189 Chronic kidney disease, unspecified: Secondary | ICD-10-CM | POA: Insufficient documentation

## 2021-04-03 DIAGNOSIS — Z8616 Personal history of COVID-19: Secondary | ICD-10-CM | POA: Diagnosis not present

## 2021-04-03 DIAGNOSIS — R531 Weakness: Secondary | ICD-10-CM | POA: Diagnosis present

## 2021-04-03 DIAGNOSIS — R519 Headache, unspecified: Secondary | ICD-10-CM | POA: Diagnosis not present

## 2021-04-03 DIAGNOSIS — W19XXXA Unspecified fall, initial encounter: Secondary | ICD-10-CM

## 2021-04-03 DIAGNOSIS — N179 Acute kidney failure, unspecified: Secondary | ICD-10-CM | POA: Diagnosis not present

## 2021-04-03 DIAGNOSIS — W1839XA Other fall on same level, initial encounter: Secondary | ICD-10-CM | POA: Insufficient documentation

## 2021-04-03 LAB — CBC WITH DIFFERENTIAL/PLATELET
Abs Immature Granulocytes: 0.06 10*3/uL (ref 0.00–0.07)
Basophils Absolute: 0 10*3/uL (ref 0.0–0.1)
Basophils Relative: 0 %
Eosinophils Absolute: 0.2 10*3/uL (ref 0.0–0.5)
Eosinophils Relative: 1 %
HCT: 37.5 % — ABNORMAL LOW (ref 39.0–52.0)
Hemoglobin: 12 g/dL — ABNORMAL LOW (ref 13.0–17.0)
Immature Granulocytes: 1 %
Lymphocytes Relative: 12 %
Lymphs Abs: 1.3 10*3/uL (ref 0.7–4.0)
MCH: 33.3 pg (ref 26.0–34.0)
MCHC: 32 g/dL (ref 30.0–36.0)
MCV: 104.2 fL — ABNORMAL HIGH (ref 80.0–100.0)
Monocytes Absolute: 1.2 10*3/uL — ABNORMAL HIGH (ref 0.1–1.0)
Monocytes Relative: 11 %
Neutro Abs: 8.3 10*3/uL — ABNORMAL HIGH (ref 1.7–7.7)
Neutrophils Relative %: 75 %
Platelets: 170 10*3/uL (ref 150–400)
RBC: 3.6 MIL/uL — ABNORMAL LOW (ref 4.22–5.81)
RDW: 12.5 % (ref 11.5–15.5)
WBC: 11.1 10*3/uL — ABNORMAL HIGH (ref 4.0–10.5)
nRBC: 0 % (ref 0.0–0.2)

## 2021-04-03 LAB — BASIC METABOLIC PANEL
Anion gap: 3 — ABNORMAL LOW (ref 5–15)
BUN: 37 mg/dL — ABNORMAL HIGH (ref 8–23)
CO2: 24 mmol/L (ref 22–32)
Calcium: 8.6 mg/dL — ABNORMAL LOW (ref 8.9–10.3)
Chloride: 106 mmol/L (ref 98–111)
Creatinine, Ser: 1.96 mg/dL — ABNORMAL HIGH (ref 0.61–1.24)
GFR, Estimated: 33 mL/min — ABNORMAL LOW (ref >60)
Glucose, Bld: 69 mg/dL — ABNORMAL LOW (ref 70–99)
Potassium: 4 mmol/L (ref 3.5–5.1)
Sodium: 133 mmol/L — ABNORMAL LOW (ref 135–145)

## 2021-04-03 LAB — RESP PANEL BY RT-PCR (FLU A&B, COVID) ARPGX2
Influenza A by PCR: NEGATIVE
Influenza B by PCR: NEGATIVE
SARS Coronavirus 2 by RT PCR: NEGATIVE

## 2021-04-03 MED ORDER — SODIUM CHLORIDE 0.9 % IV BOLUS
500.0000 mL | Freq: Once | INTRAVENOUS | Status: AC
  Administered 2021-04-03: 500 mL via INTRAVENOUS

## 2021-04-03 NOTE — ED Provider Notes (Signed)
[facility] EMERGENCY DEPARTMENT Provider Note   CSN: [id number] Arrival date & time: 04/03/21  1723     History Chief Complaint  Patient presents with   Casey Cooley is a 87 y.o. male.  Patient presents ER chief complaint of fall.  He states that he was brushing his teeth when he felt his legs give out.  He did not pass out but his legs felt too weak and he fell to the ground.  Complaining of pain to the back of the head.  Denies any other extremity pain.  Denies recent illnesses such as fevers vomiting cough or diarrhea.      Past Medical History:  Diagnosis Date   Hypercholesterolemia    RETINAL DETACHMENT, BILATERAL, HX OF 05/31/2009   Qualifier: Diagnosis of  By: Tilden Dome      Patient Active Problem List   Diagnosis Date Noted   COPD  ? GOLD 0 / mild AB   06/25/2020   Acute respiratory failure with hypoxia ([place]) 08/29/2019   Acute kidney injury superimposed on chronic kidney disease ([place]) 08/29/2019   Chronic non-seasonal allergic rhinitis 08/23/2019   Chronic constipation 08/23/2019   Major depression, recurrent, chronic ([place]) 08/23/2019   Increased intraocular pressure, bilateral 08/23/2019   GERD without esophagitis 08/23/2019   Physical deconditioning    Pressure injury of skin 08/17/2019   Generalized weakness    Pneumonia due to COVID-19 virus 08/13/2019   Facet degeneration of lumbar region 03/28/2018   Hyperlipemia 05/31/2009   Essential hypertension 05/31/2009    Past Surgical History:  Procedure Laterality Date   EYE SURGERY     FOOT SURGERY         No family history on file.  Social History   Tobacco Use   Smoking status: Former    Types: Pipe    Quit date: 1998    Years since quitting: 24.6   Smokeless tobacco: Never  Substance Use Topics   Alcohol use: Not Currently    Comment: occ   Drug use: Never    Home Medications Prior to Admission medications   Medication Sig Start Date End Date Taking? Authorizing  Provider  acetaminophen (TYLENOL 8 HOUR ARTHRITIS PAIN) 650 MG CR tablet Take 1,300 mg by mouth 2 (two) times daily.    [provider]  albuterol (VENTOLIN HFA) 108 (90 Base) MCG/ACT inhaler Inhale 2 puffs into the lungs every 6 (six) hours as needed for wheezing or shortness of breath. 09/07/19   Hennie Duos, MD  budesonide-formoterol [facility]) 160-4.5 MCG/ACT inhaler Inhale 2 puffs into the lungs 2 (two) times daily.    [provider]  cholecalciferol (VITAMIN D3) 25 MCG (1000 UT) tablet Take 1,000 Units by mouth daily.    [provider]  docusate sodium (COLACE) 100 MG capsule Take 100 mg by mouth daily.    [provider]  escitalopram (LEXAPRO) 10 MG tablet Take 1 tablet (10 mg total) by mouth daily. 09/07/19   Hennie Duos, MD  fluticasone [facility]encion [facility]) 50 MCG/ACT nasal spray Place 2 sprays into both nostrils daily as needed for allergies or rhinitis. 09/07/19   Hennie Duos, MD  glipiZIDE (GLUCOTROL XL) 2.5 MG 24 hr tablet Take 2.5 mg by mouth daily with breakfast.    [provider]  guaiFENesin-dextromethorphan (ROBITUSSIN DM) 100-10 MG/5ML syrup Take 10 mLs by mouth every 4 (four) hours as needed for cough. Patient not taking: Reported on 06/25/2020 08/22/19   Barton Dubois,  MD  latanoprost (XALATAN) 0.005 % ophthalmic solution Place 1 drop into both eyes at bedtime. Patient not taking: Reported on 06/25/2020 09/07/19   Hennie Duos, MD  losartan (COZAAR) 50 MG tablet Take 1 tablet (50 mg total) by mouth daily. 09/07/19   Hennie Duos, MD  methocarbamol (ROBAXIN) 500 MG tablet Take 500 mg by mouth at bedtime.    [provider]  Multiple Vitamin (MULTIVITAMIN WITH MINERALS) TABS tablet Take 1 tablet by mouth daily.    [provider]  NON FORMULARY Diet: No Added Salt    [provider]  Omeprazole-Sodium Bicarbonate (ZEGERID OTC) 20-1100 MG CAPS capsule Take 1 capsule by mouth daily before  breakfast. 09/07/19   Hennie Duos, MD  polyethylene glycol powder (GLYCOLAX/MIRALAX) 17 GM/SCOOP powder Take 17 g by mouth daily as needed for mild constipation or moderate constipation. 09/07/19   Hennie Duos, MD  simvastatin (ZOCOR) 20 MG tablet Take 1 tablet (20 mg total) by mouth daily at 6 PM. 09/07/19   Hennie Duos, MD  tamsulosin (FLOMAX) 0.4 MG CAPS capsule Take 0.4 mg by mouth daily.    [provider]  zinc sulfate 220 (50 Zn) MG capsule Take 1 capsule (220 mg total) by mouth every other day. Patient not taking: Reported on 06/25/2020 09/07/19   Hennie Duos, MD    Allergies    Patient has no known allergies.  Review of Systems   Review of Systems  Constitutional:  Negative for fever.  HENT:  Negative for ear pain and sore throat.   Eyes:  Negative for pain.  Respiratory:  Negative for cough.   Cardiovascular:  Negative for chest pain.  Gastrointestinal:  Negative for abdominal pain.  Genitourinary:  Negative for flank pain.  Musculoskeletal:  Negative for back pain.  Skin:  Negative for color change and rash.  Neurological:  Negative for syncope.  All other systems reviewed and are negative.  Physical Exam Updated Vital Signs BP (!) 104/53   Pulse 68   Temp 98.5 F (36.9 C) (Oral)   Resp 17   Ht '6\' 1"'$  (1.854 m)   Wt 102.5 kg   SpO2 98%   BMI 29.81 kg/m   Physical Exam Constitutional:      Appearance: He is well-developed.  HENT:     Head: Normocephalic.     Nose: Nose normal.  Eyes:     Extraocular Movements: Extraocular movements intact.  Cardiovascular:     Rate and Rhythm: Normal rate.  Pulmonary:     Effort: Pulmonary effort is normal.  Skin:    Coloration: Skin is not jaundiced.  Neurological:     General: No focal deficit present.     Mental Status: He is alert and oriented to person, place, and time. Mental status is at baseline.     Cranial Nerves: No cranial nerve deficit.     Motor: No weakness.    ED Results  / Procedures / Treatments   Labs (all labs ordered are listed, but only abnormal results are displayed) Labs Reviewed  CBC WITH DIFFERENTIAL/PLATELET - Abnormal; Notable for the following components:      Result Value   WBC 11.1 (*)    RBC 3.60 (*)    Hemoglobin 12.0 (*)    HCT 37.5 (*)    MCV 104.2 (*)    Neutro Abs 8.3 (*)    Monocytes Absolute 1.2 (*)    All other components within normal limits  BASIC  METABOLIC PANEL - Abnormal; Notable for the following components:   Sodium 133 (*)    Glucose, Bld 69 (*)    BUN 37 (*)    Creatinine, Ser 1.96 (*)    Calcium 8.6 (*)    GFR, Estimated 33 (*)    Anion gap 3 (*)    All other components within normal limits  RESP PANEL BY RT-PCR (FLU A&B, COVID) ARPGX2  URINALYSIS, ROUTINE W REFLEX MICROSCOPIC    EKG None  Radiology CT Head Wo Contrast  Result Date: 04/03/2021 CLINICAL DATA:  Head trauma, minor (Age >= 65y); Neck trauma (Age >= 65y). Fall, occipital head injury EXAM: CT HEAD WITHOUT CONTRAST CT CERVICAL SPINE WITHOUT CONTRAST TECHNIQUE: Multidetector CT imaging of the head and cervical spine was performed following the standard protocol without intravenous contrast. Multiplanar CT image reconstructions of the cervical spine were also generated. COMPARISON:  None. FINDINGS: CT HEAD FINDINGS Brain: Normal anatomic configuration. Parenchymal volume loss is commensurate with the patient's age. Moderate periventricular white matter changes are present likely reflecting the sequela of small vessel ischemia. Remote lacunar infarcts are noted within the basal ganglia bilaterally. No abnormal intra or extra-axial mass lesion or fluid collection. No abnormal mass effect or midline shift. No evidence of acute intracranial hemorrhage or infarct. Ventricular size is normal. Cerebellum unremarkable. Vascular: No asymmetric hyperdense vasculature at the skull base. Skull: Intact Sinuses/Orbits: There is mild mucosal thickening within the maxillary  sinuses bilaterally and some secretions noted within the right ethmoid air cells. Remaining paranasal sinuses are clear. Bilateral scleral banding has been performed. The ocular lenses have been removed. There is a small amount of spongiform material containing punctate gas seen superolateral to the right ocular globe which may represent surgical packing material or injected filler. The orbits are otherwise unremarkable. Other: Mastoid air cells and middle ear cavities are clear. CT CERVICAL SPINE FINDINGS Alignment: Normal cervical lordosis. Mild dextrocurvature of the cervical spine is likely positional in nature. No listhesis. Skull base and vertebrae: The craniocervical junction is unremarkable. The atlantodental interval is not widened. Advanced degenerative changes are seen at the atlantodental articulation. There is no acute fracture of the cervical spine. Vertebral body heights have been preserved. Soft tissues and spinal canal: No prevertebral fluid or swelling. No visible canal hematoma. There is minimal central canal stenosis at C6-7 secondary to posterior disc osteophyte complex. The AP diameter of the canal at this level is approximately 9 mm. Moderate atherosclerotic calcification is seen within the carotid bifurcations. Disc levels: There is intervertebral disc space narrowing and endplate remodeling at D34-534 in keeping with changes of advanced degenerative disc disease. Milder degenerative changes are noted at C5-6 and C7-T1. Review of the sagittal reformats demonstrates no prevertebral soft tissue thickening. Review of the axial images demonstrates asymmetric left facet arthrosis at C3-4, C4-5, and C5-6 resulting in mild-to-moderate neuroforaminal narrowing. Upper chest: Unremarkable Other: None IMPRESSION: No acute intracranial injury.  No calvarial fracture. Moderate senescent change. Remote lacunar infarcts noted within the basal ganglia bilaterally. Bilateral scleral buckling and excision of the  ocular lenses. Spongiform material within the right orbit surrounding the right globe may represent postsurgical packing material or injected filler. Correlation with surgical history may be helpful. No acute fracture or listhesis of the cervical spine. Peripheral vascular disease. Electronically Signed   By: Fidela Salisbury M.D.   On: 04/03/2021 19:54   CT Cervical Spine Wo Contrast  Result Date: 04/03/2021 CLINICAL DATA:  Head trauma, minor (Age >= 65y); Neck trauma (  Age >= 65y). Fall, occipital head injury EXAM: CT HEAD WITHOUT CONTRAST CT CERVICAL SPINE WITHOUT CONTRAST TECHNIQUE: Multidetector CT imaging of the head and cervical spine was performed following the standard protocol without intravenous contrast. Multiplanar CT image reconstructions of the cervical spine were also generated. COMPARISON:  None. FINDINGS: CT HEAD FINDINGS Brain: Normal anatomic configuration. Parenchymal volume loss is commensurate with the patient's age. Moderate periventricular white matter changes are present likely reflecting the sequela of small vessel ischemia. Remote lacunar infarcts are noted within the basal ganglia bilaterally. No abnormal intra or extra-axial mass lesion or fluid collection. No abnormal mass effect or midline shift. No evidence of acute intracranial hemorrhage or infarct. Ventricular size is normal. Cerebellum unremarkable. Vascular: No asymmetric hyperdense vasculature at the skull base. Skull: Intact Sinuses/Orbits: There is mild mucosal thickening within the maxillary sinuses bilaterally and some secretions noted within the right ethmoid air cells. Remaining paranasal sinuses are clear. Bilateral scleral banding has been performed. The ocular lenses have been removed. There is a small amount of spongiform material containing punctate gas seen superolateral to the right ocular globe which may represent surgical packing material or injected filler. The orbits are otherwise unremarkable. Other: Mastoid  air cells and middle ear cavities are clear. CT CERVICAL SPINE FINDINGS Alignment: Normal cervical lordosis. Mild dextrocurvature of the cervical spine is likely positional in nature. No listhesis. Skull base and vertebrae: The craniocervical junction is unremarkable. The atlantodental interval is not widened. Advanced degenerative changes are seen at the atlantodental articulation. There is no acute fracture of the cervical spine. Vertebral body heights have been preserved. Soft tissues and spinal canal: No prevertebral fluid or swelling. No visible canal hematoma. There is minimal central canal stenosis at C6-7 secondary to posterior disc osteophyte complex. The AP diameter of the canal at this level is approximately 9 mm. Moderate atherosclerotic calcification is seen within the carotid bifurcations. Disc levels: There is intervertebral disc space narrowing and endplate remodeling at D34-534 in keeping with changes of advanced degenerative disc disease. Milder degenerative changes are noted at C5-6 and C7-T1. Review of the sagittal reformats demonstrates no prevertebral soft tissue thickening. Review of the axial images demonstrates asymmetric left facet arthrosis at C3-4, C4-5, and C5-6 resulting in mild-to-moderate neuroforaminal narrowing. Upper chest: Unremarkable Other: None IMPRESSION: No acute intracranial injury.  No calvarial fracture. Moderate senescent change. Remote lacunar infarcts noted within the basal ganglia bilaterally. Bilateral scleral buckling and excision of the ocular lenses. Spongiform material within the right orbit surrounding the right globe may represent postsurgical packing material or injected filler. Correlation with surgical history may be helpful. No acute fracture or listhesis of the cervical spine. Peripheral vascular disease. Electronically Signed   By: Fidela Salisbury M.D.   On: 04/03/2021 19:54   DG Chest Port 1 View  Result Date: 04/03/2021 CLINICAL DATA:  Fall, right  shoulder pain EXAM: PORTABLE CHEST 1 VIEW COMPARISON:  08/13/2019 FINDINGS: Low lung volumes. Bibasilar atelectasis. Mild cardiomegaly. No effusions or pneumothorax. No acute bony abnormality. IMPRESSION: Low lung volumes, bibasilar atelectasis. Electronically Signed   By: Rolm Baptise M.D.   On: 04/03/2021 19:35   DG Shoulder Left  Result Date: 04/03/2021 CLINICAL DATA:  Fall, left shoulder pain EXAM: LEFT SHOULDER - 2+ VIEW COMPARISON:  None. FINDINGS: Degenerative changes in the left [facility] and glenohumeral joints with joint space narrowing and spurring. No acute bony abnormality. Specifically, no fracture, subluxation, or dislocation. IMPRESSION: No acute bony abnormality. Electronically Signed   By: Rolm Baptise  M.D.   On: 04/03/2021 19:36    Procedures Procedures   Medications Ordered in ED Medications  sodium chloride 0.9 % bolus 500 mL (500 mLs Intravenous New Bag/Given 04/03/21 2001)    ED Course  I have reviewed the triage vital signs and the nursing notes.  Pertinent labs & imaging results that were available during my care of the patient were reviewed by me and considered in my medical decision making (see chart for details).    MDM Rules/Calculators/A&P                           Patient reports an episode of weakness that caused him to fall.  Denies loss of consciousness.  Unremarkable imaging shows no acute findings.  Radiologist mentioned abnormality of the right orbit patient does espouse a history of surgical intervention in the past.  Denies any eye pain or eye discomfort.  Labs however show evidence of renal insufficiency.  Patient given IV fluid hydration here.  Recommending follow-up with his primary care doctor and monitoring his creatinine within the week advised plenty of fluid intake at home.  Advised immediate return for new numbness weakness fevers pain or any additional concerns.  Final Clinical Impression(s) / ED Diagnoses Final diagnoses:  Fall, initial  encounter  AKI (acute kidney injury) [facility])    Rx / [place] Orders ED Discharge Orders     None        Luna Fuse, MD 04/03/21 2050

## 2021-04-03 NOTE — Discharge Instructions (Addendum)
Call your primary care doctor or specialist as discussed in the next 2-3 days.   Return immediately back to the ER if:  Your symptoms worsen within the next 12-24 hours. You develop new symptoms such as new fevers, persistent vomiting, new pain, shortness of breath, or new weakness or numbness, or if you have any other concerns.  

## 2021-04-03 NOTE — ED Notes (Addendum)
Pt reports falling about an hour ago while brushing teeth and suddenly felt weak. States he did not pass out or lose consciousness per pt. States he broke the commode. Does state he hit his head and is now complaining of pain at back of head. No edema or bruising to this area visibly right now, no tenderness to the touch. Pt is not on blood thinners. 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 Edema noted to BLE below the knees. Areas of bruising to bilateral forearms and hands

## 2021-04-03 NOTE — ED Triage Notes (Signed)
Pt arrived [facility] from [facility] and was called out for a fall today. Unsure of head injury. Staff told EMS pt has been sick since [place] with , n/v/d and weakness.

## 2021-05-06 ENCOUNTER — Encounter (HOSPITAL_COMMUNITY): Payer: Self-pay | Admitting: Radiology

## 2021-05-13 ENCOUNTER — Other Ambulatory Visit (HOSPITAL_COMMUNITY): Payer: Self-pay | Admitting: Nurse Practitioner

## 2021-05-13 DIAGNOSIS — R9089 Other abnormal findings on diagnostic imaging of central nervous system: Secondary | ICD-10-CM

## 2021-05-20 ENCOUNTER — Ambulatory Visit (HOSPITAL_BASED_OUTPATIENT_CLINIC_OR_DEPARTMENT_OTHER)
Admission: RE | Admit: 2021-05-20 | Discharge: 2021-05-20 | Disposition: A | Discharge status: Home / Self Care | Payer: Medicare Other | Source: Ambulatory Visit | Attending: Nurse Practitioner | Admitting: Nurse Practitioner

## 2021-05-20 ENCOUNTER — Other Ambulatory Visit: Payer: Self-pay

## 2021-05-20 DIAGNOSIS — I6782 Cerebral ischemia: Secondary | ICD-10-CM | POA: Diagnosis not present

## 2021-05-20 DIAGNOSIS — R9089 Other abnormal findings on diagnostic imaging of central nervous system: Secondary | ICD-10-CM | POA: Diagnosis present

## 2021-07-17 IMAGING — DX DG CHEST 1V PORT
1 series · 1 of 1 positions shown · non-contrast
Comparison: 05/31/2009

CLINICAL DATA: Cough, JCVNA-5C positive

EXAM:
PORTABLE CHEST 1 VIEW

[chest ap]
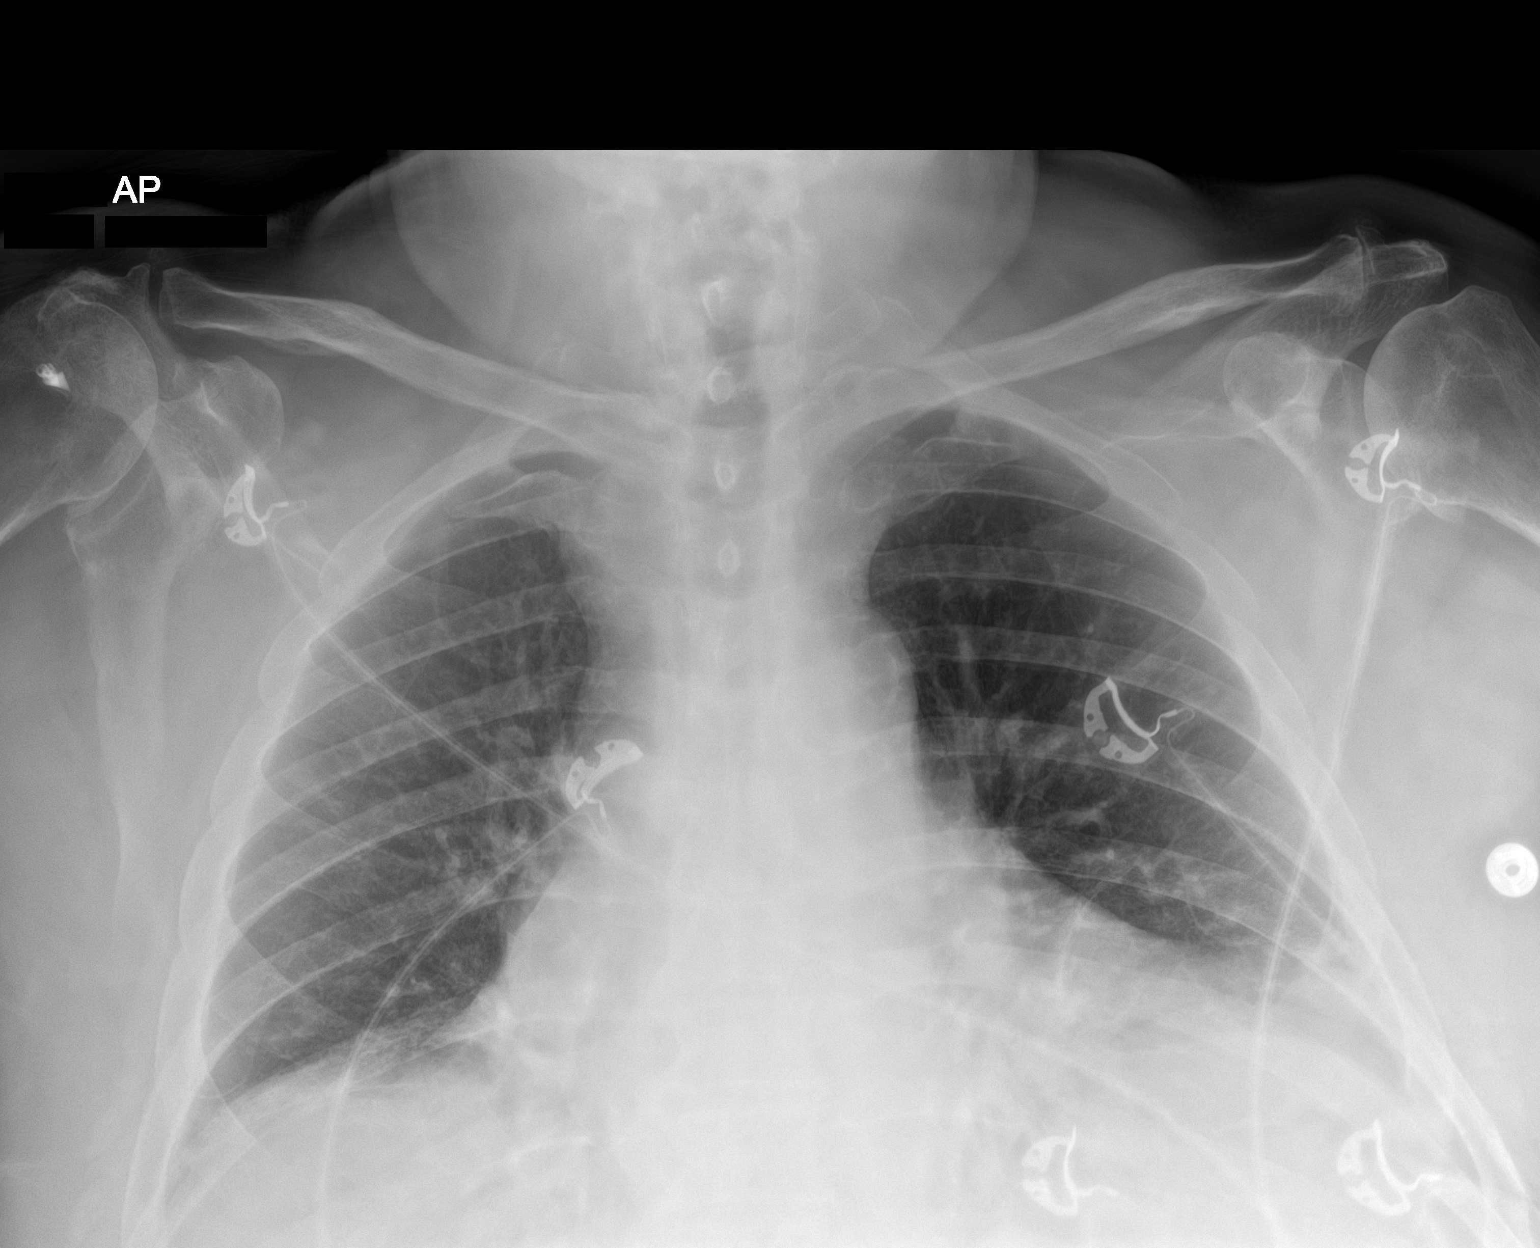

[1 of 1 positions shown; findings below may reference images not displayed]

FINDINGS: The heart size and mediastinal contours are within normal limits.
Low lung volumes. Mild streaky opacity in the left lung base. No
pleural effusion or pneumothorax. The visualized skeletal
structures are unremarkable.
IMPRESSION: Low lung volumes with streaky opacity in the left lung base, which
could reflect atelectasis or infiltrate.

## 2022-08-22 ENCOUNTER — Other Ambulatory Visit: Payer: Self-pay

## 2022-08-22 ENCOUNTER — Emergency Department (HOSPITAL_COMMUNITY)
Admission: EM | Admit: 2022-08-22 | Discharge: 2022-08-22 | Disposition: A | Discharge status: Skilled Nursing Facility | Payer: Medicare Other | Attending: Emergency Medicine | Admitting: Emergency Medicine

## 2022-08-22 ENCOUNTER — Encounter (HOSPITAL_COMMUNITY): Payer: Self-pay

## 2022-08-22 ENCOUNTER — Emergency Department (HOSPITAL_COMMUNITY): Payer: Medicare Other

## 2022-08-22 DIAGNOSIS — Y92129 Unspecified place in nursing home as the place of occurrence of the external cause: Secondary | ICD-10-CM | POA: Diagnosis not present

## 2022-08-22 DIAGNOSIS — E119 Type 2 diabetes mellitus without complications: Secondary | ICD-10-CM | POA: Diagnosis not present

## 2022-08-22 DIAGNOSIS — I10 Essential (primary) hypertension: Secondary | ICD-10-CM | POA: Insufficient documentation

## 2022-08-22 DIAGNOSIS — Z79899 Other long term (current) drug therapy: Secondary | ICD-10-CM | POA: Diagnosis not present

## 2022-08-22 DIAGNOSIS — W19XXXD Unspecified fall, subsequent encounter: Secondary | ICD-10-CM

## 2022-08-22 DIAGNOSIS — J449 Chronic obstructive pulmonary disease, unspecified: Secondary | ICD-10-CM | POA: Diagnosis not present

## 2022-08-22 DIAGNOSIS — S61411A Laceration without foreign body of right hand, initial encounter: Secondary | ICD-10-CM | POA: Insufficient documentation

## 2022-08-22 DIAGNOSIS — W01198A Fall on same level from slipping, tripping and stumbling with subsequent striking against other object, initial encounter: Secondary | ICD-10-CM | POA: Insufficient documentation

## 2022-08-22 DIAGNOSIS — S6991XA Unspecified injury of right wrist, hand and finger(s), initial encounter: Secondary | ICD-10-CM | POA: Diagnosis present

## 2022-08-22 DIAGNOSIS — Z7984 Long term (current) use of oral hypoglycemic drugs: Secondary | ICD-10-CM | POA: Insufficient documentation

## 2022-08-22 DIAGNOSIS — R531 Weakness: Secondary | ICD-10-CM | POA: Insufficient documentation

## 2022-08-22 DIAGNOSIS — S61412A Laceration without foreign body of left hand, initial encounter: Secondary | ICD-10-CM | POA: Insufficient documentation

## 2022-08-22 HISTORY — DX: Chronic kidney disease, unspecified: N18.9

## 2022-08-22 HISTORY — DX: Gastro-esophageal reflux disease without esophagitis: K21.9

## 2022-08-22 HISTORY — DX: Hyperlipidemia, unspecified: E78.5

## 2022-08-22 HISTORY — DX: Depression, unspecified: F32.A

## 2022-08-22 HISTORY — DX: Essential (primary) hypertension: I10

## 2022-08-22 HISTORY — DX: Benign prostatic hyperplasia without lower urinary tract symptoms: N40.0

## 2022-08-22 HISTORY — DX: Type 2 diabetes mellitus without complications: E11.9

## 2022-08-22 HISTORY — DX: Chronic obstructive pulmonary disease, unspecified: J44.9

## 2022-08-22 LAB — CBC WITH DIFFERENTIAL/PLATELET
Abs Immature Granulocytes: 0.04 10*3/uL (ref 0.00–0.07)
Basophils Absolute: 0 10*3/uL (ref 0.0–0.1)
Basophils Relative: 0 %
Eosinophils Absolute: 0.1 10*3/uL (ref 0.0–0.5)
Eosinophils Relative: 1 %
HCT: 40.7 % (ref 39.0–52.0)
Hemoglobin: 12.5 g/dL — ABNORMAL LOW (ref 13.0–17.0)
Immature Granulocytes: 0 %
Lymphocytes Relative: 24 %
Lymphs Abs: 2.2 10*3/uL (ref 0.7–4.0)
MCH: 32.5 pg (ref 26.0–34.0)
MCHC: 30.7 g/dL (ref 30.0–36.0)
MCV: 105.7 fL — ABNORMAL HIGH (ref 80.0–100.0)
Monocytes Absolute: 0.9 10*3/uL (ref 0.1–1.0)
Monocytes Relative: 10 %
Neutro Abs: 5.9 10*3/uL (ref 1.7–7.7)
Neutrophils Relative %: 65 %
Platelets: 206 10*3/uL (ref 150–400)
RBC: 3.85 MIL/uL — ABNORMAL LOW (ref 4.22–5.81)
RDW: 13.2 % (ref 11.5–15.5)
WBC: 9.1 10*3/uL (ref 4.0–10.5)
nRBC: 0 % (ref 0.0–0.2)

## 2022-08-22 LAB — URINALYSIS, ROUTINE W REFLEX MICROSCOPIC
Bacteria, UA: NONE SEEN
Bilirubin Urine: NEGATIVE
Glucose, UA: NEGATIVE mg/dL
Hgb urine dipstick: NEGATIVE
Ketones, ur: NEGATIVE mg/dL
Nitrite: NEGATIVE
Protein, ur: 30 mg/dL — AB
Specific Gravity, Urine: 1.017 (ref 1.005–1.030)
pH: 6 (ref 5.0–8.0)

## 2022-08-22 LAB — BASIC METABOLIC PANEL
Anion gap: 11 (ref 5–15)
BUN: 29 mg/dL — ABNORMAL HIGH (ref 8–23)
CO2: 22 mmol/L (ref 22–32)
Calcium: 8.2 mg/dL — ABNORMAL LOW (ref 8.9–10.3)
Chloride: 105 mmol/L (ref 98–111)
Creatinine, Ser: 1.65 mg/dL — ABNORMAL HIGH (ref 0.61–1.24)
GFR, Estimated: 40 mL/min — ABNORMAL LOW (ref >60)
Glucose, Bld: 74 mg/dL (ref 70–99)
Potassium: 4.1 mmol/L (ref 3.5–5.1)
Sodium: 138 mmol/L (ref 135–145)

## 2022-08-22 NOTE — ED Triage Notes (Signed)
Pt slid out of bed this morning around 7am and pt fell transferring from w/c to toilet . Pt is uncertain if he hit his head. Pt transfers self around in w/c independently . Both falls were unwitnessed. Pt c/o top of head hurting .

## 2022-08-22 NOTE — ED Notes (Signed)
Provided pt with cup of coffee per RN approval

## 2022-08-22 NOTE — ED Notes (Addendum)
This nurse called- Cummings,Brenda (Sister [phone] )- no one answered- VM left explaining pt was seen in ED for fall, scans resulted showing no fractures or acute injuries, and that pt would be discharged back to facility- call back number and nurse name left in message, also explained that attempted to call back niece, but had the wrong number in chart, attempted to call [facility] in [place] to give report several times also, but no one answered.

## 2022-08-22 NOTE — ED Notes (Signed)
Pt called out saying he had urinated on himself; this NT and Mitzi Hansen C. NT changed the pt into a new brief; linen did not need to be changed as it was not wet; pt comfortable with blanket and call light within reach

## 2022-08-22 NOTE — Discharge Instructions (Addendum)
Please read attachment and continue taking meds as prescribed.

## 2022-08-22 NOTE — ED Provider Notes (Signed)
[facility] EMERGENCY DEPARTMENT Provider Note   CSN: [id number] Arrival date & time: 08/22/22  1507     History  Chief Complaint  Patient presents with   Casey Cooley is a 89 y.o. male, h/o DM, HTN, COPD, presented after having 2 falls this morning patient.  Patient stated he hit his head on the first fall.  Falls happened after he got up out of his bed stays up and stated he fell.  Second fall patient said he was trying to get into a recliner chair and slid down.  Patient is currently in a c-collar.  Patient states he did not fall on his legs hands or back.  Patient states the crown of his head has dull pain but denied any other symptoms.  Patient was recently hospitalized 07/04/22 for suspicion of CVA/TIA like symptoms however was discharged after neuro determine was no CVA/TIA.   Patient denied being on any blood thinners, chest pain, shortness of breath, dysuria, presyncopal symptoms, recent illness, headache, dizziness  Last ECHO: 08/05/22 LVEF >55%  Home Medications Prior to Admission medications   Medication Sig Start Date End Date Taking? Authorizing Provider  acetaminophen (TYLENOL 8 HOUR ARTHRITIS PAIN) 650 MG CR tablet Take 1,300 mg by mouth 2 (two) times daily.    [provider]  albuterol (VENTOLIN HFA) 108 (90 Base) MCG/ACT inhaler Inhale 2 puffs into the lungs every 6 (six) hours as needed for wheezing or shortness of breath. 09/07/19   Hennie Duos, MD  budesonide-formoterol [facility]) 160-4.5 MCG/ACT inhaler Inhale 2 puffs into the lungs 2 (two) times daily.    [provider]  cholecalciferol (VITAMIN D3) 25 MCG (1000 UT) tablet Take 1,000 Units by mouth daily.    [provider]  docusate sodium (COLACE) 100 MG capsule Take 100 mg by mouth daily.    [provider]  escitalopram (LEXAPRO) 10 MG tablet Take 1 tablet (10 mg total) by mouth daily. 09/07/19   Hennie Duos, MD  fluticasone Asencion Islam) 50 MCG/ACT nasal  spray Place 2 sprays into both nostrils daily as needed for allergies or rhinitis. 09/07/19   Hennie Duos, MD  glipiZIDE (GLUCOTROL XL) 2.5 MG 24 hr tablet Take 2.5 mg by mouth daily with breakfast.    [provider]  guaiFENesin-dextromethorphan (ROBITUSSIN DM) 100-10 MG/5ML syrup Take 10 mLs by mouth every 4 (four) hours as needed for cough. Patient not taking: Reported on 06/25/2020 08/22/19   Barton Dubois, MD  latanoprost (XALATAN) 0.005 % ophthalmic solution Place 1 drop into both eyes at bedtime. Patient not taking: Reported on 06/25/2020 09/07/19   Hennie Duos, MD  losartan (COZAAR) 50 MG tablet Take 1 tablet (50 mg total) by mouth daily. 09/07/19   Hennie Duos, MD  methocarbamol (ROBAXIN) 500 MG tablet Take 500 mg by mouth at bedtime.    [provider]  Multiple Vitamin (MULTIVITAMIN WITH MINERALS) TABS tablet Take 1 tablet by mouth daily.    [provider]  NON FORMULARY Diet: No Added Salt    [provider]  Omeprazole-Sodium Bicarbonate (ZEGERID OTC) 20-1100 MG CAPS capsule Take 1 capsule by mouth daily before breakfast. 09/07/19   Hennie Duos, MD  polyethylene glycol powder (GLYCOLAX/MIRALAX) 17 GM/SCOOP powder Take 17 g by mouth daily as needed for mild constipation or moderate constipation. 09/07/19   Hennie Duos, MD  simvastatin (ZOCOR) 20 MG tablet Take 1 tablet (20 mg total) by mouth daily at  6 PM. 09/07/19   Hennie Duos, MD  tamsulosin (FLOMAX) 0.4 MG CAPS capsule Take 0.4 mg by mouth daily.    [provider]  zinc sulfate 220 (50 Zn) MG capsule Take 1 capsule (220 mg total) by mouth every other day. Patient not taking: Reported on 06/25/2020 09/07/19   Hennie Duos, MD      Allergies    Patient has no known allergies.    Review of Systems   Review of Systems See HPI Physical Exam Updated Vital Signs BP (!) 128/58   Pulse 88   Temp 98.7 F (37.1 C) (Oral)   Resp 15   Ht '6\' 1"'$  (1.854  m)   Wt 93 kg   SpO2 97%   BMI 27.05 kg/m  Physical Exam Vitals and nursing note reviewed.  Constitutional:      General: He is not in acute distress.    Appearance: He is well-developed.  HENT:     Head: Normocephalic and atraumatic.     Right Ear: Tympanic membrane, ear canal and external ear normal.     Left Ear: Tympanic membrane, ear canal and external ear normal.     Ears:     Comments: No hemotympanum noted bilaterally No postauricular ecchymosis noted bilaterally Eyes:     Extraocular Movements: Extraocular movements intact.     Conjunctiva/sclera: Conjunctivae normal.     Pupils: Pupils are equal, round, and reactive to light.     Comments: No ocular ecchymosis noted bilaterally.  Neck:     Comments: Patient was in a c-collar so this cannot be assessed Cardiovascular:     Rate and Rhythm: Normal rate and regular rhythm.     Comments: Bilateral 2+ radial, posterior tibialis pulses. Pulmonary:     Effort: Pulmonary effort is normal. No respiratory distress.     Breath sounds: Normal breath sounds.  Abdominal:     Palpations: Abdomen is soft.     Tenderness: There is no abdominal tenderness. There is no guarding.  Musculoskeletal:     Comments: Decreased range of motion however this is baseline for patient  Skin:    General: Skin is warm and dry.     Capillary Refill: Capillary refill takes less than 2 seconds.     Comments: Small cuts around the hands No ecchymosis or acute signs of trauma noted  Neurological:     General: No focal deficit present.     Mental Status: He is alert. Mental status is at baseline.     Comments: CN II-VIII, XII intact CN XI-X could not be assessed as patient was in C-collar Sensation was intact in all 4 limbs Motor skill was intact in all 4 limbs Patient's h/o generalized weakness and decreased AROM at baseline made patient unable to conduct cerebellar neuro tests  Psychiatric:        Mood and Affect: Mood normal.     ED Results  / Procedures / Treatments   Labs (all labs ordered are listed, but only abnormal results are displayed) Labs Reviewed  BASIC METABOLIC PANEL - Abnormal; Notable for the following components:      Result Value   BUN 29 (*)    Creatinine, Ser 1.65 (*)    Calcium 8.2 (*)    GFR, Estimated 40 (*)    All other components within normal limits  CBC WITH DIFFERENTIAL/PLATELET - Abnormal; Notable for the following components:   RBC 3.85 (*)    Hemoglobin 12.5 (*)  MCV 105.7 (*)    All other components within normal limits  URINALYSIS, ROUTINE W REFLEX MICROSCOPIC - Abnormal; Notable for the following components:   Protein, ur 30 (*)    Leukocytes,Ua TRACE (*)    All other components within normal limits    EKG EKG Interpretation  Date/Time:  Friday August 22 2022 15:53:43 EST Ventricular Rate:  87 PR Interval:  208 QRS Duration: 131 QT Interval:  447 QTC Calculation: 538 R Axis:   57 Text Interpretation: Sinus rhythm Nonspecific intraventricular conduction delay Abnormal ekg Confirmed by Noemi Chapel [phone]) on 08/22/2022 3:56:35 PM  Radiology CT Head Wo Contrast  Result Date: 08/22/2022 CLINICAL DATA:  Head trauma, minor (Age >= 65y) fall EXAM: CT HEAD WITHOUT CONTRAST CT CERVICAL SPINE WITHOUT CONTRAST TECHNIQUE: Multidetector CT imaging of the head and cervical spine was performed following the standard protocol without intravenous contrast. Multiplanar CT image reconstructions of the cervical spine were also generated. RADIATION DOSE REDUCTION: This exam was performed according to the departmental dose-optimization program which includes automated exposure control, adjustment of the mA and/or kV according to patient size and/or use of iterative reconstruction technique. COMPARISON:  08/03/2022, 04/03/2021 FINDINGS: CT HEAD FINDINGS Brain: No evidence of acute infarction, hemorrhage, hydrocephalus, extra-axial collection or mass lesion/mass effect. Extensive low-density changes  within the periventricular and subcortical white matter compatible with chronic microvascular ischemic change. Mild-moderate diffuse cerebral volume loss. Vascular: Atherosclerotic calcifications involving the large vessels of the skull base. No unexpected hyperdense vessel. Skull: Normal. Negative for fracture or focal lesion. Sinuses/Orbits: Diffuse paranasal sinus mucosal thickening. Air-fluid level is present within the right maxillary sinus. Mastoid air cells are clear. Other: None. CT CERVICAL SPINE FINDINGS Alignment: Facet joints are aligned without dislocation or traumatic listhesis. Dens and lateral masses are aligned. Skull base and vertebrae: No acute fracture. No primary bone lesion or focal pathologic process. Soft tissues and spinal canal: No prevertebral fluid or swelling. No visible canal hematoma. Disc levels: Degenerative disc disease is most pronounced at the C6-7 level. Left greater than right facet joint arthropathy. Upper chest: Negative. Other: Bilateral carotid atherosclerosis. IMPRESSION: 1. No acute intracranial abnormality. 2. No acute fracture or subluxation of the cervical spine. 3. Chronic microvascular ischemic change and cerebral volume loss. 4. Paranasal sinus disease with air-fluid level within the right maxillary sinus, which can be seen in the setting of acute sinusitis. Electronically Signed   By: Davina Poke D.O.   On: 08/22/2022 17:41   CT CERVICAL SPINE WO CONTRAST  Result Date: 08/22/2022 CLINICAL DATA:  Head trauma, minor (Age >= 65y) fall EXAM: CT HEAD WITHOUT CONTRAST CT CERVICAL SPINE WITHOUT CONTRAST TECHNIQUE: Multidetector CT imaging of the head and cervical spine was performed following the standard protocol without intravenous contrast. Multiplanar CT image reconstructions of the cervical spine were also generated. RADIATION DOSE REDUCTION: This exam was performed according to the departmental dose-optimization program which includes automated exposure  control, adjustment of the mA and/or kV according to patient size and/or use of iterative reconstruction technique. COMPARISON:  08/03/2022, 04/03/2021 FINDINGS: CT HEAD FINDINGS Brain: No evidence of acute infarction, hemorrhage, hydrocephalus, extra-axial collection or mass lesion/mass effect. Extensive low-density changes within the periventricular and subcortical white matter compatible with chronic microvascular ischemic change. Mild-moderate diffuse cerebral volume loss. Vascular: Atherosclerotic calcifications involving the large vessels of the skull base. No unexpected hyperdense vessel. Skull: Normal. Negative for fracture or focal lesion. Sinuses/Orbits: Diffuse paranasal sinus mucosal thickening. Air-fluid level is present within the right maxillary sinus. Mastoid  air cells are clear. Other: None. CT CERVICAL SPINE FINDINGS Alignment: Facet joints are aligned without dislocation or traumatic listhesis. Dens and lateral masses are aligned. Skull base and vertebrae: No acute fracture. No primary bone lesion or focal pathologic process. Soft tissues and spinal canal: No prevertebral fluid or swelling. No visible canal hematoma. Disc levels: Degenerative disc disease is most pronounced at the C6-7 level. Left greater than right facet joint arthropathy. Upper chest: Negative. Other: Bilateral carotid atherosclerosis. IMPRESSION: 1. No acute intracranial abnormality. 2. No acute fracture or subluxation of the cervical spine. 3. Chronic microvascular ischemic change and cerebral volume loss. 4. Paranasal sinus disease with air-fluid level within the right maxillary sinus, which can be seen in the setting of acute sinusitis. Electronically Signed   By: Davina Poke D.O.   On: 08/22/2022 17:41    Procedures Procedures    Medications Ordered in ED Medications - No data to display  ED Course/ Medical Decision Making/ A&P                             Medical Decision Making Amount and/or Complexity  of Data Reviewed Labs: ordered. Radiology: ordered.   Graviel Payeur Millington 89 y.o. presented today for fall. Working DDx that I considered at this time includes, but not limited to, CVA/TIA, skull fracture, arrhythmia, orthostatic hypotension, hypoglycemia, medication induced, UTI.  Review of prior external notes: 07/04/22 [facility] Visit  Unique Tests and My Interpretation: UA: Trace leukocytes CBC w diff: Unremarkable, improved from previous CBC BMP: Unremarkable, improved from previous BMP CT head wo contrast: suspect possible sinusitis but no other acute abnormalities CT Cervical Spine wo contrast: no acute cervical spinal changes EKG: possible 3-to-1 atrial flutter with regular rate  Discussion with Independent Historian: none  Discussion of Management of Tests: none  Risk: Low:  - based on diagnostic testing/clinical impression and treatment plan  Risk Stratification Score: none  Staffed with Noemi Chapel, MD  R/o DDx: CVA/TIA: CT negative Epidural/Subdural Hematoma: CT negative Skull Fracture: CT negative, negative physical exam Electrolyte Abnormalities: BMP was reassuring Arrhythmia: Patient has been noted to have a flutter in the past.  Denied any chest pain or dizziness to indicate this is the cause Med Induced: Patient denied any changes in medication  Plan: Patient presented after having 2 falls this morning.  After an unremarkable physical exam, we will obtain labs and a CT scan of his head.  CT was negative for any CVA/TIA, hematomas, skull fractures.  Labs are also reassuring infection improved from previous CBC and BMP.  Patient has trace leukocytes and urine however patient denied any urinary symptoms is at baseline so we will not treat for UTI.  Patient's vitals have remained stable throughout ED course.  After reassuring labs and imaging not suspect any life-threatening diagnoses at this time.  Patient appears stable at this time to be discharged with  outpatient follow-up.  Patient verbalized understanding of plan.   Final Clinical Impression(s) / ED Diagnoses Final diagnoses:  Fall, subsequent encounter    Rx / DC Orders ED Discharge Orders     None         Elvina Sidle 08/22/22 2002    Noemi Chapel, MD 08/23/22 1438

## 2023-01-23 ENCOUNTER — Observation Stay: Payer: Medicare Other

## 2023-01-23 ENCOUNTER — Other Ambulatory Visit: Payer: Self-pay

## 2023-01-23 ENCOUNTER — Observation Stay (HOSPITAL_COMMUNITY): Payer: Medicare Other

## 2023-01-23 ENCOUNTER — Observation Stay (HOSPITAL_COMMUNITY)
Admission: EM | Admit: 2023-01-23 | Discharge: 2023-01-24 | Disposition: A | Discharge status: Skilled Nursing Facility | Payer: Medicare Other | Attending: Internal Medicine | Admitting: Internal Medicine

## 2023-01-23 ENCOUNTER — Observation Stay (HOSPITAL_BASED_OUTPATIENT_CLINIC_OR_DEPARTMENT_OTHER): Payer: Medicare Other

## 2023-01-23 ENCOUNTER — Encounter (HOSPITAL_COMMUNITY): Payer: Self-pay

## 2023-01-23 DIAGNOSIS — R55 Syncope and collapse: Principal | ICD-10-CM | POA: Diagnosis present

## 2023-01-23 DIAGNOSIS — J449 Chronic obstructive pulmonary disease, unspecified: Secondary | ICD-10-CM | POA: Diagnosis not present

## 2023-01-23 DIAGNOSIS — Z7901 Long term (current) use of anticoagulants: Secondary | ICD-10-CM | POA: Diagnosis not present

## 2023-01-23 DIAGNOSIS — Z79899 Other long term (current) drug therapy: Secondary | ICD-10-CM | POA: Diagnosis not present

## 2023-01-23 DIAGNOSIS — I129 Hypertensive chronic kidney disease with stage 1 through stage 4 chronic kidney disease, or unspecified chronic kidney disease: Secondary | ICD-10-CM | POA: Insufficient documentation

## 2023-01-23 DIAGNOSIS — I4892 Unspecified atrial flutter: Secondary | ICD-10-CM | POA: Insufficient documentation

## 2023-01-23 DIAGNOSIS — L03119 Cellulitis of unspecified part of limb: Secondary | ICD-10-CM | POA: Diagnosis not present

## 2023-01-23 DIAGNOSIS — Z7984 Long term (current) use of oral hypoglycemic drugs: Secondary | ICD-10-CM | POA: Insufficient documentation

## 2023-01-23 DIAGNOSIS — L03115 Cellulitis of right lower limb: Secondary | ICD-10-CM | POA: Insufficient documentation

## 2023-01-23 DIAGNOSIS — Z87891 Personal history of nicotine dependence: Secondary | ICD-10-CM | POA: Diagnosis not present

## 2023-01-23 DIAGNOSIS — R9431 Abnormal electrocardiogram [ECG] [EKG]: Secondary | ICD-10-CM

## 2023-01-23 DIAGNOSIS — I959 Hypotension, unspecified: Secondary | ICD-10-CM | POA: Diagnosis not present

## 2023-01-23 DIAGNOSIS — I4581 Long QT syndrome: Secondary | ICD-10-CM | POA: Diagnosis not present

## 2023-01-23 DIAGNOSIS — E1122 Type 2 diabetes mellitus with diabetic chronic kidney disease: Secondary | ICD-10-CM | POA: Insufficient documentation

## 2023-01-23 DIAGNOSIS — N1832 Chronic kidney disease, stage 3b: Secondary | ICD-10-CM | POA: Diagnosis not present

## 2023-01-23 DIAGNOSIS — L02419 Cutaneous abscess of limb, unspecified: Secondary | ICD-10-CM | POA: Insufficient documentation

## 2023-01-23 LAB — ECHOCARDIOGRAM COMPLETE
Area-P 1/2: 3.65 cm2
Height: 73 in
S' Lateral: 2.8 cm
Weight: 3280.44 oz

## 2023-01-23 LAB — CBC
HCT: 37.3 % — ABNORMAL LOW (ref 39.0–52.0)
Hemoglobin: 11.4 g/dL — ABNORMAL LOW (ref 13.0–17.0)
MCH: 32.1 pg (ref 26.0–34.0)
MCHC: 30.6 g/dL (ref 30.0–36.0)
MCV: 105.1 fL — ABNORMAL HIGH (ref 80.0–100.0)
Platelets: 153 10*3/uL (ref 150–400)
RBC: 3.55 MIL/uL — ABNORMAL LOW (ref 4.22–5.81)
RDW: 13.5 % (ref 11.5–15.5)
WBC: 7.3 10*3/uL (ref 4.0–10.5)
nRBC: 0 % (ref 0.0–0.2)

## 2023-01-23 LAB — HEMOGLOBIN A1C
Hgb A1c MFr Bld: 6 % — ABNORMAL HIGH (ref 4.8–5.6)
Mean Plasma Glucose: 125.5 mg/dL

## 2023-01-23 LAB — BASIC METABOLIC PANEL
Anion gap: 7 (ref 5–15)
BUN: 39 mg/dL — ABNORMAL HIGH (ref 8–23)
CO2: 25 mmol/L (ref 22–32)
Calcium: 8.2 mg/dL — ABNORMAL LOW (ref 8.9–10.3)
Chloride: 106 mmol/L (ref 98–111)
Creatinine, Ser: 1.87 mg/dL — ABNORMAL HIGH (ref 0.61–1.24)
GFR, Estimated: 34 mL/min — ABNORMAL LOW (ref >60)
Glucose, Bld: 85 mg/dL (ref 70–99)
Potassium: 4.6 mmol/L (ref 3.5–5.1)
Sodium: 138 mmol/L (ref 135–145)

## 2023-01-23 LAB — URINALYSIS, ROUTINE W REFLEX MICROSCOPIC
Bilirubin Urine: NEGATIVE
Glucose, UA: NEGATIVE mg/dL
Hgb urine dipstick: NEGATIVE
Ketones, ur: NEGATIVE mg/dL
Leukocytes,Ua: NEGATIVE
Nitrite: NEGATIVE
Protein, ur: NEGATIVE mg/dL
Specific Gravity, Urine: 1.01 (ref 1.005–1.030)
pH: 5 (ref 5.0–8.0)

## 2023-01-23 LAB — TROPONIN I (HIGH SENSITIVITY)
Troponin I (High Sensitivity): 6 ng/L (ref <18)
Troponin I (High Sensitivity): 7 ng/L (ref <18)

## 2023-01-23 LAB — PROCALCITONIN: Procalcitonin: <0.1 ng/mL

## 2023-01-23 LAB — BRAIN NATRIURETIC PEPTIDE: B Natriuretic Peptide: 68 pg/mL (ref 0.0–100.0)

## 2023-01-23 LAB — GLUCOSE, CAPILLARY
Glucose-Capillary: 81 mg/dL (ref 70–99)
Glucose-Capillary: 88 mg/dL (ref 70–99)

## 2023-01-23 LAB — LACTIC ACID, PLASMA: Lactic Acid, Venous: 1 mmol/L (ref 0.5–1.9)

## 2023-01-23 MED ORDER — ACETAMINOPHEN 325 MG PO TABS: 650.0000 mg | ORAL_TABLET | Freq: Four times a day (QID) | ORAL | Status: DC | PRN

## 2023-01-23 MED ORDER — TAMSULOSIN HCL 0.4 MG PO CAPS
0.4000 mg | ORAL_CAPSULE | Freq: Every day | ORAL | Status: DC
  Administered 2023-01-24: 0.4 mg via ORAL
  Filled 2023-01-23: qty 1

## 2023-01-23 MED ORDER — PROCHLORPERAZINE EDISYLATE 10 MG/2ML IJ SOLN: 5.0000 mg | Freq: Four times a day (QID) | INTRAMUSCULAR | Status: DC | PRN

## 2023-01-23 MED ORDER — MOMETASONE FURO-FORMOTEROL FUM 200-5 MCG/ACT IN AERO
2.0000 | INHALATION_SPRAY | Freq: Two times a day (BID) | RESPIRATORY_TRACT | Status: DC
  Administered 2023-01-23 – 2023-01-24 (×2): 2 via RESPIRATORY_TRACT
  Filled 2023-01-23: qty 8.8

## 2023-01-23 MED ORDER — ACETAMINOPHEN 650 MG RE SUPP: 650.0000 mg | Freq: Four times a day (QID) | RECTAL | Status: DC | PRN

## 2023-01-23 MED ORDER — HEPARIN SODIUM (PORCINE) 5000 UNIT/ML IJ SOLN
5000.0000 [IU] | Freq: Three times a day (TID) | INTRAMUSCULAR | Status: DC
  Administered 2023-01-23 – 2023-01-24 (×3): 5000 [IU] via SUBCUTANEOUS
  Filled 2023-01-23 (×3): qty 1

## 2023-01-23 MED ORDER — SIMVASTATIN 20 MG PO TABS
20.0000 mg | ORAL_TABLET | Freq: Every day | ORAL | Status: DC
  Administered 2023-01-23: 20 mg via ORAL
  Filled 2023-01-23: qty 1

## 2023-01-23 MED ORDER — FLUTICASONE PROPIONATE 50 MCG/ACT NA SUSP
2.0000 | Freq: Every day | NASAL | Status: DC
  Administered 2023-01-23: 2 via NASAL
  Filled 2023-01-23: qty 16

## 2023-01-23 MED ORDER — ESCITALOPRAM OXALATE 10 MG PO TABS
10.0000 mg | ORAL_TABLET | Freq: Every day | ORAL | Status: DC
  Administered 2023-01-24: 10 mg via ORAL
  Filled 2023-01-23: qty 1

## 2023-01-23 MED ORDER — INSULIN ASPART 100 UNIT/ML IJ SOLN
0.0000 [IU] | Freq: Three times a day (TID) | INTRAMUSCULAR | Status: DC
  Administered 2023-01-24: 1 [IU] via SUBCUTANEOUS

## 2023-01-23 MED ORDER — VITAMIN B-12 100 MCG PO TABS
500.0000 ug | ORAL_TABLET | Freq: Every day | ORAL | Status: DC
  Administered 2023-01-24: 500 ug via ORAL
  Filled 2023-01-23: qty 5

## 2023-01-23 MED ORDER — SODIUM CHLORIDE 0.9 % IV SOLN: INTRAVENOUS | Status: DC | PRN

## 2023-01-23 MED ORDER — MELATONIN 3 MG PO TABS
9.0000 mg | ORAL_TABLET | Freq: Every day | ORAL | Status: DC
  Administered 2023-01-23: 9 mg via ORAL
  Filled 2023-01-23: qty 3

## 2023-01-23 MED ORDER — CEFAZOLIN SODIUM-DEXTROSE 1-4 GM/50ML-% IV SOLN
1.0000 g | Freq: Three times a day (TID) | INTRAVENOUS | Status: DC
  Administered 2023-01-23 – 2023-01-24 (×3): 1 g via INTRAVENOUS
  Filled 2023-01-23 (×9): qty 50

## 2023-01-23 NOTE — ED Notes (Addendum)
ED TO INPATIENT HANDOFF REPORT  ED Nurse Name and Phone #: Alvino Chapel [phone]  S Name/Age/Gender Casey Cooley 89 y.o. male Room/Bed: APA12/APA12  Code Status   Code Status: Prior  Home/SNF/Other Nursing Home Patient oriented to: self, place, time, and situation Is this baseline? Yes   Triage Complete: Triage complete  Chief Complaint Syncope and collapse [R55]  Triage Note Pt brought in by [facility] from [facility] [place] for syncopal episode. Upon arrival pt was found behind the nursing facility unresponsive. Pt's initial BP was 86/60s w/ EMS and tx with a 500 mL bolus. Pt is A&O x's 4. Per EMS pt has no hx of heart issues and pt is showing new a-flutter on the monitor.    Allergies No Known Allergies  Level of Care/Admitting Diagnosis ED Disposition     ED Disposition  Admit   Condition  --   Comment  Hospital Area: [facility] [[id number]]  Level of Care: Telemetry [5]  Covid Evaluation: Asymptomatic - no recent exposure (last 10 days) testing not required  Diagnosis: Syncope and collapse [780.2.ICD-9-CM]  Admitting Physician: TAT, DAVID [[id number]]  Attending Physician: TAT, DAVID [[id number]]          B Medical/Surgery History Past Medical History:  Diagnosis Date   BPH (benign prostatic hyperplasia)    per MAR   CKD (chronic kidney disease)    COPD (chronic obstructive pulmonary disease) (HCC)    Depression    Diabetes mellitus without complication (HCC)    GERD (gastroesophageal reflux disease)    Hypercholesterolemia    Hyperlipidemia    Hypertension    RETINAL DETACHMENT, BILATERAL, HX OF 05/31/2009   Qualifier: Diagnosis of  By: Vernie Murders     Past Surgical History:  Procedure Laterality Date   EYE SURGERY     FOOT SURGERY       A IV Location/Drains/Wounds Patient Lines/Drains/Airways Status     Active Line/Drains/Airways     Name Placement date Placement time Site Days   Peripheral IV 01/23/23 20 G Anterior;Left Forearm 01/23/23   1018  Forearm  less than 1   Pressure Injury 08/14/19 Buttocks Right Stage 2 -  Partial thickness loss of dermis presenting as a shallow open injury with a red, pink wound bed without slough. 08/14/19  1511  -- 1258            Intake/Output Last 24 hours No intake or output data in the 24 hours ending 01/23/23 1345  Labs/Imaging Results for orders placed or performed during the hospital encounter of 01/23/23 (from the past 48 hour(s))  Basic metabolic panel     Status: Abnormal   Collection Time: 01/23/23 10:38 AM  Result Value Ref Range   Sodium 138 135 - 145 mmol/L   Potassium 4.6 3.5 - 5.1 mmol/L   Chloride 106 98 - 111 mmol/L   CO2 25 22 - 32 mmol/L   Glucose, Bld 85 70 - 99 mg/dL    Comment: Glucose reference range applies only to samples taken after fasting for at least 8 hours.   BUN 39 (H) 8 - 23 mg/dL   Creatinine, Ser 4.54 (H) 0.61 - 1.24 mg/dL   Calcium 8.2 (L) 8.9 - 10.3 mg/dL   GFR, Estimated 34 (L) >60 mL/min    Comment: (NOTE) Calculated using the CKD-EPI Creatinine Equation (2021)    Anion gap 7 5 - 15    Comment: Performed at [facility], [address] [place], [place] [address]  CBC  Status: Abnormal   Collection Time: 01/23/23 10:38 AM  Result Value Ref Range   WBC 7.3 4.0 - 10.5 K/uL   RBC 3.55 (L) 4.22 - 5.81 MIL/uL   Hemoglobin 11.4 (L) 13.0 - 17.0 g/dL   HCT 40.9 (L) 81.1 - 91.4 %   MCV 105.1 (H) 80.0 - 100.0 fL   MCH 32.1 26.0 - 34.0 pg   MCHC 30.6 30.0 - 36.0 g/dL   RDW 78.2 95.6 - 21.3 %   Platelets 153 150 - 400 K/uL   nRBC 0.0 0.0 - 0.2 %    Comment: Performed at [facility], [address] [place], [place] [address]  Troponin I (High Sensitivity)     Status: None   Collection Time: 01/23/23 10:53 AM  Result Value Ref Range   Troponin I (High Sensitivity) 6 <18 ng/L    Comment: (NOTE) Elevated high sensitivity troponin I (hsTnI) values and significant  changes across serial measurements may suggest ACS but many other  chronic and  acute conditions are known to elevate hsTnI results.  Refer to the "Links" section for chest pain algorithms and additional  guidance. Performed at [facility], [address], [place], [place] [address]   Brain natriuretic peptide     Status: None   Collection Time: 01/23/23 10:55 AM  Result Value Ref Range   B Natriuretic Peptide 68.0 0.0 - 100.0 pg/mL    Comment: Performed at [facility], [address], [place], [place] [address]  Troponin I (High Sensitivity)     Status: None   Collection Time: 01/23/23 12:40 PM  Result Value Ref Range   Troponin I (High Sensitivity) 7 <18 ng/L    Comment: (NOTE) Elevated high sensitivity troponin I (hsTnI) values and significant  changes across serial measurements may suggest ACS but many other  chronic and acute conditions are known to elevate hsTnI results.  Refer to the "Links" section for chest pain algorithms and additional  guidance. Performed at [facility], [address] [place], [place] [address]    No results found.  Pending Labs Unresulted Labs (From admission, onward)     Start     Ordered   01/23/23 1028  Urinalysis, Routine w reflex microscopic -Urine, Clean Catch  Once,   URGENT       Question:  Specimen Source  Answer:  Urine, Clean Catch   01/23/23 1027            Vitals/Pain Today's Vitals   01/23/23 1030 01/23/23 1045 01/23/23 1100 01/23/23 1245  BP: 116/66 117/62 (!) 130/59 135/74  Pulse: 80 (!) 59 68   Resp: 20 11 11 13   Temp:    97.6 F (36.4 C)  TempSrc:    Oral  SpO2: 99% 95% 90% 99%  Weight:      Height:      PainSc:    0-No pain    Isolation Precautions No active isolations  Medications Medications - No data to display  Mobility manual wheelchair     Focused Assessments Pt with bilateral lower edema, a flutter.  Alert and oriented   R Recommendations: See Admitting Provider Note  Report given to:   Additional Notes:

## 2023-01-23 NOTE — Progress Notes (Signed)
Zaccari Canuto called to check on patient. This is the patients great niece. She would like to be contacted with any concerns with patient at [phone].

## 2023-01-23 NOTE — ED Notes (Signed)
Changed pt out of his clothing and into hospital gown.

## 2023-01-23 NOTE — Hospital Course (Signed)
89 year old male with a history of hypertension, CKD stage III, COPD, hyperlipidemia, diabetes mellitus type 2, GERD presenting with an episode of unresponsiveness.  The patient resides at [facility].  Apparently the patient woke up and went to his usual routine and had breakfast.  Apparently the patient was sitting in his wheelchair outside smoking when the staff found the patient to be unresponsive.  EMS was activated.  Upon EMS arrival, patient was noted to have blood pressure of 86/60.  The patient recalled people trying to wake him up.  He denied biting his tongue or bowel or bladder incontinence.  There is no witnessed tonic-clonic activity.  The patient was given a 500 cc bolus of fluid on the way to the hospital.  Upon arrival to the hospital, the patient was alert and talkative and following commands. Patient denies fevers, chills, headache, chest pain, dyspnea, nausea, vomiting, diarrhea, abdominal pain, dysuria, hematuria, hematochezia, and melena. In the ED, the patient was afebrile hemodynamically stable with oxygen saturation 97-99% on room air.  WBC 7.3, hemoglobin 11.4, platelets 153,000.  Sodium 138, potassium 4.6, bicarbonate 25, serum creatinine 1.7.  Troponin 6>>7. EKG showed a flutter with nonspecific ST changes.

## 2023-01-23 NOTE — ED Triage Notes (Signed)
Pt brought in by [facility] from [facility] [place] for syncopal episode. Upon arrival pt was found behind the nursing facility unresponsive. Pt's initial BP was 86/60s w/ EMS and tx with a 500 mL bolus. Pt is A&O x's 4. Per EMS pt has no hx of heart issues and pt is showing new a-flutter on the monitor.

## 2023-01-23 NOTE — Progress Notes (Signed)
*  PRELIMINARY RESULTS* Echocardiogram 2D Echocardiogram has been performed. No suprasternal or subcostal windows.  Stacey Drain 01/23/2023, 4:18 PM

## 2023-01-23 NOTE — H&P (Signed)
History and Physical    Patient: Casey Cooley NGE:[id number] DOB: [birth year] DOA: 01/23/2023 DOS: the patient was seen and examined on 01/23/2023 PCP: Pcp, No  Patient coming from: [facility]  Chief Complaint:  Chief Complaint  Patient presents with   Loss of Consciousness   HPI: Casey Cooley is a 89 year old male with a history of hypertension, CKD stage III, COPD, hyperlipidemia, diabetes mellitus type 2, GERD presenting with an episode of unresponsiveness.  The patient resides at [facility].  Apparently the patient woke up and went to his usual routine and had breakfast.  Apparently the patient was sitting in his wheelchair outside smoking when the staff found the patient to be unresponsive.  EMS was activated.  Upon EMS arrival, patient was noted to have blood pressure of 86/60.  The patient recalled people trying to wake him up.  He denied biting his tongue or bowel or bladder incontinence.  There is no witnessed tonic-clonic activity.  The patient was given a 500 cc bolus of fluid on the way to the hospital.  Upon arrival to the hospital, the patient was alert and talkative and following commands. Patient denies fevers, chills, headache, chest pain, dyspnea, nausea, vomiting, diarrhea, abdominal pain, dysuria, hematuria, hematochezia, and melena. In the ED, the patient was afebrile hemodynamically stable with oxygen saturation 97-99% on room air.  WBC 7.3, hemoglobin 11.4, platelets 153,000.  Sodium 138, potassium 4.6, bicarbonate 25, serum creatinine 1.7.  Troponin 6>>7. EKG showed a flutter with nonspecific ST changes.  Review of Systems: As mentioned in the history of present illness. All other systems reviewed and are negative. Past Medical History:  Diagnosis Date   BPH (benign prostatic hyperplasia)    per MAR   CKD (chronic kidney disease)    COPD (chronic obstructive pulmonary disease) (HCC)    Depression    Diabetes mellitus without complication (HCC)    GERD  (gastroesophageal reflux disease)    Hypercholesterolemia    Hyperlipidemia    Hypertension    RETINAL DETACHMENT, BILATERAL, HX OF 05/31/2009   Qualifier: Diagnosis of  By: Vernie Murders     Past Surgical History:  Procedure Laterality Date   EYE SURGERY     FOOT SURGERY     Social History:  reports that he quit smoking about 26 years ago. His smoking use included pipe. He has never used smokeless tobacco. He reports that he does not currently use alcohol. He reports that he does not use drugs.  No Known Allergies  History reviewed. No pertinent family history.  Prior to Admission medications   Medication Sig Start Date End Date Taking? Authorizing Provider  acetaminophen (TYLENOL) 325 MG tablet Take 650 mg by mouth 2 (two) times daily.   Yes [provider]  budesonide-formoterol (SYMBICORT) 160-4.5 MCG/ACT inhaler Inhale 2 puffs into the lungs 2 (two) times daily.   Yes [provider]  calcium carbonate (OSCAL) 1500 (600 Ca) MG TABS tablet Take 600 mg of elemental calcium by mouth daily.   Yes [provider]  cholecalciferol (VITAMIN D3) 25 MCG (1000 UT) tablet Take 2,000 Units by mouth daily.   Yes [provider]  docusate sodium (COLACE) 100 MG capsule Take 100 mg by mouth daily.   Yes [provider]  escitalopram (LEXAPRO) 10 MG tablet Take 1 tablet (10 mg total) by mouth daily. 09/07/19  Yes Margit Hanks, MD  fluticasone [facility]) 50 MCG/ACT nasal spray Place 2 sprays into both nostrils daily as needed for  allergies or rhinitis. Patient taking differently: Place 2 sprays into both nostrils daily. 09/07/19  Yes Margit Hanks, MD  furosemide (LASIX) 40 MG tablet Take 40 mg by mouth daily.   Yes [provider]  glipiZIDE (GLUCOTROL XL) 2.5 MG 24 hr tablet Take 2.5 mg by mouth daily with breakfast.   Yes [provider]  lidocaine 4 % Place 1 patch onto the skin daily. Apply 1 patch to mid lower back every  day for 12 hours on and 12 hours off.   Yes [provider]  LORazepam (ATIVAN) 0.5 MG tablet Take 0.25 mg by mouth 2 (two) times daily.   Yes [provider]  losartan (COZAAR) 100 MG tablet Take 100 mg by mouth daily.   Yes [provider]  Melatonin 10 MG TABS Take 10 mg by mouth at bedtime.   Yes [provider]  methocarbamol (ROBAXIN) 500 MG tablet Take 500 mg by mouth at bedtime.   Yes [provider]  mirtazapine (REMERON) 15 MG tablet Take 15 mg by mouth at bedtime.   Yes [provider]  Multiple Vitamin (MULTIVITAMIN WITH MINERALS) TABS tablet Take 1 tablet by mouth daily.   Yes [provider]  Omeprazole-Sodium Bicarbonate (ZEGERID OTC) 20-1100 MG CAPS capsule Take 1 capsule by mouth daily before breakfast. 09/07/19  Yes Margit Hanks, MD  ondansetron (ZOFRAN) 4 MG tablet Take 4 mg by mouth every 8 (eight) hours as needed for nausea or vomiting.   Yes [provider]  polyethylene glycol powder (GLYCOLAX/MIRALAX) 17 GM/SCOOP powder Take 17 g by mouth daily as needed for mild constipation or moderate constipation. 09/07/19  Yes Margit Hanks, MD  simvastatin (ZOCOR) 20 MG tablet Take 1 tablet (20 mg total) by mouth daily at 6 PM. 09/07/19  Yes Margit Hanks, MD  sodium chloride (MURO 128) 5 % ophthalmic solution Place 1 drop into both eyes 2 (two) times daily.   Yes [provider]  tamsulosin (FLOMAX) 0.4 MG CAPS capsule Take 0.4 mg by mouth daily.   Yes [provider]  vitamin B-12 (CYANOCOBALAMIN) 100 MCG tablet Take 500 mcg by mouth daily.   Yes [provider]  zinc oxide 20 % ointment Apply 1 Application topically daily as needed (Redness).   Yes [provider]    Physical Exam: Vitals:   01/23/23 1045 01/23/23 1100 01/23/23 1245 01/23/23 1300  BP: 117/62 (!) 130/59 135/74 (!) 150/74  Pulse: (!) 59 68  69  Resp: 11 11 13  (!) 9  Temp:   97.6 F (36.4 C)    TempSrc:   Oral   SpO2: 95% 90% 99% 99%  Weight:      Height:       GENERAL:  A&O x 3, NAD, well developed, cooperative, follows commands HEENT: [place]/AT, No thrush, No icterus, No oral ulcers Neck:  No neck mass, No meningismus, soft, supple CV: IRRR, no S3, no S4, no rub, no JVD Lungs:  bibasilar crackles. No wheeze Abd: soft/NT +BS, nondistended Ext: 2 + LE edema, no lymphangitis, no cyanosis, mild erythema RLE pretibial to foot Neuro:  CN II-XII intact, strength 4/5 in RUE, RLE, strength 4/5 LUE, LLE; sensation intact bilateral; no dysmetria; babinski equivocal  Data Reviewed: Data reviewed above in history  Assessment and Plan: Syncope -etiology unclear but concerned about hypotension and new onset afib -orthostatics -echo -telemetry -IVF  Hypotension -likely volume depletion -check lactate -check PCT -CXR  Cellulitis right leg -start  cefazolin  HTN -hold losartan due to hypotension  COPD -stable on RA  Atrial flutter -CHADSVASc = 4 (age, HTN, DM) -rate controlled -echo  CKD 3b -baseline creatinine 1.6-1.9  DM2 -ISS -check A1C -hold glipizide   Advance Care Planning: FULL  Consults: none  Family Communication: none  Severity of Illness: The appropriate patient status for this patient is OBSERVATION. Observation status is judged to be reasonable and necessary in order to provide the required intensity of service to ensure the patient's safety. The patient's presenting symptoms, physical exam findings, and initial radiographic and laboratory data in the context of their medical condition is felt to place them at decreased risk for further clinical deterioration. Furthermore, it is anticipated that the patient will be medically stable for discharge from the hospital within 2 midnights of admission.   Author: Catarina Hartshorn, MD 01/23/2023 2:19 PM  For on call review [email or web address].

## 2023-01-24 DIAGNOSIS — N1832 Chronic kidney disease, stage 3b: Secondary | ICD-10-CM | POA: Diagnosis not present

## 2023-01-24 DIAGNOSIS — I4892 Unspecified atrial flutter: Secondary | ICD-10-CM | POA: Diagnosis not present

## 2023-01-24 DIAGNOSIS — R55 Syncope and collapse: Secondary | ICD-10-CM | POA: Diagnosis not present

## 2023-01-24 DIAGNOSIS — L03119 Cellulitis of unspecified part of limb: Secondary | ICD-10-CM | POA: Diagnosis not present

## 2023-01-24 LAB — COMPREHENSIVE METABOLIC PANEL
ALT: 11 U/L (ref 0–44)
AST: 17 U/L (ref 15–41)
Albumin: 3 g/dL — ABNORMAL LOW (ref 3.5–5.0)
Alkaline Phosphatase: 82 U/L (ref 38–126)
Anion gap: 7 (ref 5–15)
BUN: 36 mg/dL — ABNORMAL HIGH (ref 8–23)
CO2: 25 mmol/L (ref 22–32)
Calcium: 8.1 mg/dL — ABNORMAL LOW (ref 8.9–10.3)
Chloride: 105 mmol/L (ref 98–111)
Creatinine, Ser: 1.74 mg/dL — ABNORMAL HIGH (ref 0.61–1.24)
GFR, Estimated: 37 mL/min — ABNORMAL LOW (ref >60)
Glucose, Bld: 98 mg/dL (ref 70–99)
Potassium: 4.7 mmol/L (ref 3.5–5.1)
Sodium: 137 mmol/L (ref 135–145)
Total Bilirubin: 0.4 mg/dL (ref 0.3–1.2)
Total Protein: 6.4 g/dL — ABNORMAL LOW (ref 6.5–8.1)

## 2023-01-24 LAB — CULTURE, BLOOD (ROUTINE X 2)

## 2023-01-24 LAB — CBC
HCT: 34 % — ABNORMAL LOW (ref 39.0–52.0)
Hemoglobin: 10.5 g/dL — ABNORMAL LOW (ref 13.0–17.0)
MCH: 31.5 pg (ref 26.0–34.0)
MCHC: 30.9 g/dL (ref 30.0–36.0)
MCV: 102.1 fL — ABNORMAL HIGH (ref 80.0–100.0)
Platelets: 181 10*3/uL (ref 150–400)
RBC: 3.33 MIL/uL — ABNORMAL LOW (ref 4.22–5.81)
RDW: 13.7 % (ref 11.5–15.5)
WBC: 6 10*3/uL (ref 4.0–10.5)
nRBC: 0 % (ref 0.0–0.2)

## 2023-01-24 LAB — GLUCOSE, CAPILLARY
Glucose-Capillary: 82 mg/dL (ref 70–99)
Glucose-Capillary: 132 mg/dL — ABNORMAL HIGH (ref 70–99)

## 2023-01-24 MED ORDER — APIXABAN 2.5 MG PO TABS
2.5000 mg | ORAL_TABLET | Freq: Two times a day (BID) | ORAL | Status: DC
  Administered 2023-01-24: 2.5 mg via ORAL
  Filled 2023-01-24: qty 1

## 2023-01-24 MED ORDER — CEPHALEXIN 500 MG PO CAPS: 500.0000 mg | ORAL_CAPSULE | Freq: Three times a day (TID) | ORAL | 0 refills | Status: DC

## 2023-01-24 MED ORDER — CEPHALEXIN 500 MG PO CAPS
500.0000 mg | ORAL_CAPSULE | Freq: Three times a day (TID) | ORAL | Status: DC
  Administered 2023-01-24: 500 mg via ORAL
  Filled 2023-01-24: qty 1

## 2023-01-24 MED ORDER — APIXABAN 2.5 MG PO TABS: 2.5000 mg | ORAL_TABLET | Freq: Two times a day (BID) | ORAL | 1 refills | Status: DC

## 2023-01-24 NOTE — ED Provider Notes (Signed)
[facility] MEDICAL SURGICAL UNIT Provider Note   CSN: [id number] Arrival date & time: 01/23/23  1007     History  Chief Complaint  Patient presents with   Loss of Consciousness    Casey Cooley is a 89 y.o. male.  HPI     89 year old male with a history of hypertension, CKD stage III, COPD, hyperlipidemia, diabetes mellitus type 2, GERD presenting with an episode of unresponsiveness.  The patient resides at [facility].  Apparently the patient woke up and went to his usual routine and had breakfast.  Apparently the patient was sitting in his wheelchair outside smoking when the staff found the patient to be unresponsive.  EMS was activated.  Upon EMS arrival, patient was noted to have blood pressure of 86/60.  The patient recalled people trying to wake him up.  He denied biting his tongue or bowel or bladder incontinence.  There is no witnessed tonic-clonic activity.  The patient was given a 500 cc bolus of fluid on the way to the hospital.    Patient denies fevers, chills, headache, chest pain, dyspnea, nausea, vomiting, diarrhea, abdominal pain, dysuria, hematuria, hematochezia, and melena.  Patient has history of stroke.  He denies any history of seizures.  Patient denies any history of arrhythmia.  Home Medications Prior to Admission medications   Medication Sig Start Date End Date Taking? Authorizing Provider  acetaminophen (TYLENOL) 325 MG tablet Take 650 mg by mouth 2 (two) times daily.   Yes [provider]  budesonide-formoterol (SYMBICORT) 160-4.5 MCG/ACT inhaler Inhale 2 puffs into the lungs 2 (two) times daily.   Yes [provider]  calcium carbonate (OSCAL) 1500 (600 Ca) MG TABS tablet Take 600 mg of elemental calcium by mouth daily.   Yes [provider]  cholecalciferol (VITAMIN D3) 25 MCG (1000 UT) tablet Take 2,000 Units by mouth daily.   Yes [provider]  docusate sodium (COLACE) 100 MG capsule Take 100 mg by mouth daily.    Yes [provider]  escitalopram (LEXAPRO) 10 MG tablet Take 1 tablet (10 mg total) by mouth daily. 09/07/19  Yes Margit Hanks, MD  fluticasone [facility]) 50 MCG/ACT nasal spray Place 2 sprays into both nostrils daily as needed for allergies or rhinitis. Patient taking differently: Place 2 sprays into both nostrils daily. 09/07/19  Yes Margit Hanks, MD  furosemide (LASIX) 40 MG tablet Take 40 mg by mouth daily.   Yes [provider]  glipiZIDE (GLUCOTROL XL) 2.5 MG 24 hr tablet Take 2.5 mg by mouth daily with breakfast.   Yes [provider]  lidocaine 4 % Place 1 patch onto the skin daily. Apply 1 patch to mid lower back every day for 12 hours on and 12 hours off.   Yes [provider]  LORazepam (ATIVAN) 0.5 MG tablet Take 0.25 mg by mouth 2 (two) times daily.   Yes [provider]  losartan (COZAAR) 100 MG tablet Take 100 mg by mouth daily.   Yes [provider]  Melatonin 10 MG TABS Take 10 mg by mouth at bedtime.   Yes [provider]  methocarbamol (ROBAXIN) 500 MG tablet Take 500 mg by mouth at bedtime.   Yes [provider]  mirtazapine (REMERON) 15 MG tablet Take 15 mg by mouth at bedtime.   Yes [provider]  Multiple Vitamin (MULTIVITAMIN WITH MINERALS) TABS tablet Take 1 tablet by mouth daily.   Yes [provider]  Omeprazole-Sodium Bicarbonate (ZEGERID  OTC) 20-1100 MG CAPS capsule Take 1 capsule by mouth daily before breakfast. 09/07/19  Yes Margit Hanks, MD  ondansetron (ZOFRAN) 4 MG tablet Take 4 mg by mouth every 8 (eight) hours as needed for nausea or vomiting.   Yes [provider]  polyethylene glycol powder (GLYCOLAX/MIRALAX) 17 GM/SCOOP powder Take 17 g by mouth daily as needed for mild constipation or moderate constipation. 09/07/19  Yes Margit Hanks, MD  simvastatin (ZOCOR) 20 MG tablet Take 1 tablet (20 mg total) by mouth daily at 6 PM. 09/07/19  Yes  Margit Hanks, MD  sodium chloride (MURO 128) 5 % ophthalmic solution Place 1 drop into both eyes 2 (two) times daily.   Yes [provider]  tamsulosin (FLOMAX) 0.4 MG CAPS capsule Take 0.4 mg by mouth daily.   Yes [provider]  vitamin B-12 (CYANOCOBALAMIN) 100 MCG tablet Take 500 mcg by mouth daily.   Yes [provider]  zinc oxide 20 % ointment Apply 1 Application topically daily as needed (Redness).   Yes [provider]  apixaban (ELIQUIS) 2.5 MG TABS tablet Take 1 tablet (2.5 mg total) by mouth 2 (two) times daily. 01/24/23   Catarina Hartshorn, MD  cephALEXin (KEFLEX) 500 MG capsule Take 1 capsule (500 mg total) by mouth every 8 (eight) hours. 01/24/23   Catarina Hartshorn, MD      Allergies    Patient has no known allergies.    Review of Systems   Review of Systems  All other systems reviewed and are negative.   Physical Exam Updated Vital Signs BP (!) 97/54 (BP Location: Right Arm)   Pulse 80   Temp 98.7 F (37.1 C) (Oral)   Resp 20   Ht 6\' 1"  (1.854 m)   Wt 93 kg   SpO2 93%   BMI 27.05 kg/m  Physical Exam Vitals and nursing note reviewed.  Constitutional:      Appearance: He is well-developed.  HENT:     Head: Atraumatic.  Eyes:     Extraocular Movements: Extraocular movements intact.     Pupils: Pupils are equal, round, and reactive to light.  Cardiovascular:     Rate and Rhythm: Normal rate.  Pulmonary:     Effort: Pulmonary effort is normal.  Musculoskeletal:     Cervical back: Neck supple.  Skin:    General: Skin is warm.  Neurological:     Mental Status: He is alert and oriented to person, place, and time.     Cranial Nerves: No cranial nerve deficit.     Sensory: No sensory deficit.     Motor: No weakness.     Coordination: Coordination normal.     ED Results / Procedures / Treatments   Labs (all labs ordered are listed, but only abnormal results are displayed) Labs Reviewed  BASIC METABOLIC PANEL - Abnormal;  Notable for the following components:      Result Value   BUN 39 (*)    Creatinine, Ser 1.87 (*)    Calcium 8.2 (*)    GFR, Estimated 34 (*)    All other components within normal limits  CBC - Abnormal; Notable for the following components:   RBC 3.55 (*)    Hemoglobin 11.4 (*)    HCT 37.3 (*)    MCV 105.1 (*)    All other components within normal limits  HEMOGLOBIN A1C - Abnormal; Notable for the following components:   Hgb A1c MFr Bld 6.0 (*)  All other components within normal limits  CBC - Abnormal; Notable for the following components:   RBC 3.33 (*)    Hemoglobin 10.5 (*)    HCT 34.0 (*)    MCV 102.1 (*)    All other components within normal limits  COMPREHENSIVE METABOLIC PANEL - Abnormal; Notable for the following components:   BUN 36 (*)    Creatinine, Ser 1.74 (*)    Calcium 8.1 (*)    Total Protein 6.4 (*)    Albumin 3.0 (*)    GFR, Estimated 37 (*)    All other components within normal limits  GLUCOSE, CAPILLARY - Abnormal; Notable for the following components:   Glucose-Capillary 132 (*)    All other components within normal limits  CULTURE, BLOOD (ROUTINE X 2)  CULTURE, BLOOD (ROUTINE X 2)  URINALYSIS, ROUTINE W REFLEX MICROSCOPIC  BRAIN NATRIURETIC PEPTIDE  LACTIC ACID, PLASMA  PROCALCITONIN  GLUCOSE, CAPILLARY  GLUCOSE, CAPILLARY  GLUCOSE, CAPILLARY  CBG MONITORING, ED  TROPONIN I (HIGH SENSITIVITY)  TROPONIN I (HIGH SENSITIVITY)    EKG EKG Interpretation  Date/Time:  Friday January 23 2023 10:17:33 EDT Ventricular Rate:  74 PR Interval:    QRS Duration: 114 QT Interval:  455 QTC Calculation: 515 R Axis:   39 Text Interpretation: Atrial flutter Incomplete right bundle branch block Inferior infarct, acute Lateral leads are also involved Prolonged QT interval abnormal ekg Confirmed by Derwood Kaplan [phone]) on 01/23/2023 11:54:45 AM  Radiology DG Chest 1 View  Addendum Date: 01/23/2023   ADDENDUM REPORT: 01/23/2023 23:45 ADDENDUM: These  results were called by telephone at the time of interpretation on 01/23/2023 at 1:37 Pm to provider DAVID TAT , who verbally acknowledged these results. Electronically Signed   By: Darliss Cheney M.D.   On: 01/23/2023 23:45   Result Date: 01/23/2023 CLINICAL DATA:  Syncope, collapse. EXAM: CHEST  1 VIEW COMPARISON:  Chest x-ray 08/03/2022 FINDINGS: Lung volumes are low. There some linear opacities in the lung bases favored is atelectasis. Curvilinear density paralleling the right hemidiaphragm is indeterminate. There is no pleural effusion or pneumothorax. The cardiomediastinal silhouette is stable and within normal limits for projection. No acute fractures are seen. IMPRESSION: 1. Low lung volumes with bibasilar atelectasis. 2. Curvilinear density paralleling the right hemidiaphragm is indeterminate and could represent atelectasis. Free air is not excluded in the appropriate clinical setting. If there is high clinical concern consider follow-up CT of the abdomen and pelvis or decubitus views of the abdomen. Electronically Signed: By: Darliss Cheney M.D. On: 01/23/2023 15:27   CT ABDOMEN PELVIS WO CONTRAST  Result Date: 01/23/2023 CLINICAL DATA:  Possible free intraperitoneal air on recent chest radiograph. EXAM: CT ABDOMEN AND PELVIS WITHOUT CONTRAST TECHNIQUE: Multidetector CT imaging of the abdomen and pelvis was performed following the standard protocol without IV contrast. RADIATION DOSE REDUCTION: This exam was performed according to the departmental dose-optimization program which includes automated exposure control, adjustment of the mA and/or kV according to patient size and/or use of iterative reconstruction technique. COMPARISON:  Chest radiograph on 01/23/2023 FINDINGS: Lower chest: No acute findings. Hepatobiliary: No mass visualized on this unenhanced exam. Gallbladder is unremarkable. No evidence of biliary ductal dilatation. Pancreas: No mass or inflammatory process visualized on this unenhanced  exam. Spleen:  Within normal limits in size. Adrenals/Urinary tract: No evidence of urolithiasis or hydronephrosis. Unremarkable unopacified urinary bladder. Stomach/Bowel: No evidence of free intraperitoneal air. No evidence of obstruction, inflammatory process, or abnormal fluid collections. Diverticulosis is seen mainly involving the  sigmoid colon, however there is no evidence of diverticulitis. A small paraumbilical ventral hernia is seen containing a small bowel loop, however there is no evidence of bowel obstruction or strangulation. Vascular/Lymphatic: No pathologically enlarged lymph nodes identified. No evidence of abdominal aortic aneurysm. Aortic atherosclerotic calcification incidentally noted. Reproductive:  No mass or other significant abnormality. Other:  None. Musculoskeletal:  No suspicious bone lesions identified. IMPRESSION: No evidence of free intraperitoneal air or other acute findings. Small paraumbilical ventral hernia containing a small bowel loop. No evidence of bowel obstruction or strangulation. Colonic diverticulosis, without radiographic evidence of diverticulitis. Electronically Signed   By: Danae Orleans M.D.   On: 01/23/2023 19:56   ECHOCARDIOGRAM COMPLETE  Result Date: 01/23/2023    ECHOCARDIOGRAM REPORT   Patient Name:   Casey Cooley Date of Exam: 01/23/2023 Medical Rec #:  [id number]      Height:       73.0 in Accession #:    [id number]     Weight:       205.0 lb Date of Birth:  07-05-1933     BSA:          2.174 m Patient Age:    89 years       BP:           149/80 mmHg Patient Gender: M              HR:           81 bpm. [facility] Procedure: 2D Echo, Cardiac Doppler and Color Doppler Indications:    Syncope R55  History:        Patient has prior history of Echocardiogram examinations, most                 recent 06/26/2020. COPD, Arrythmias:Atrial Flutter; Risk                 Factors:Hypertension and Dyslipidemia. Hx of pneumonia due to                  COVID-19 virus.  Sonographer:    Celesta Gentile RCS Referring Phys: [phone] DAVID TAT  Sonographer Comments: No subcostal or suprasternal windows. Patient abdomen is Very hard, difficult to image. No IVC images. IMPRESSIONS  1. Left ventricular ejection fraction, by estimation, is 60 to 65%. The left ventricle has normal function. The left ventricle has no regional wall motion abnormalities. Left ventricular diastolic function could not be evaluated.  2. Right ventricular systolic function is normal. The right ventricular size is normal.  3. Left atrial size was mild to moderately dilated.  4. The mitral valve is normal in structure. No evidence of mitral valve regurgitation. No evidence of mitral stenosis.  5. The aortic valve is tricuspid. Aortic valve regurgitation is not visualized. No aortic stenosis is present.  6. Aortic dilatation noted. There is borderline dilatation of the aortic root, measuring 36 mm. Comparison(s): No significant change from prior study. FINDINGS  Left Ventricle: Left ventricular ejection fraction, by estimation, is 60 to 65%. The left ventricle has normal function. The left ventricle has no regional wall motion abnormalities. The left ventricular internal cavity size was normal in size. There is  no left ventricular hypertrophy. Left ventricular diastolic function could not be evaluated due to atrial fibrillation. Left ventricular diastolic function could not be evaluated. Right Ventricle: The right ventricular size is normal. No increase in right ventricular wall thickness. Right ventricular systolic function is normal. Left Atrium: Left atrial  size was mild to moderately dilated. Right Atrium: Right atrial size was normal in size. Pericardium: There is no evidence of pericardial effusion. Mitral Valve: The mitral valve is normal in structure. No evidence of mitral valve regurgitation. No evidence of mitral valve stenosis. Tricuspid Valve: The tricuspid valve is normal in structure.  Tricuspid valve regurgitation is not demonstrated. No evidence of tricuspid stenosis. Aortic Valve: The aortic valve is tricuspid. Aortic valve regurgitation is not visualized. No aortic stenosis is present. Pulmonic Valve: The pulmonic valve was normal in structure. Pulmonic valve regurgitation is not visualized. No evidence of pulmonic stenosis. Aorta: Aortic dilatation noted. There is borderline dilatation of the aortic root, measuring 36 mm. Venous: The inferior vena cava was not well visualized. IAS/Shunts: No atrial level shunt detected by color flow Doppler.  LEFT VENTRICLE PLAX 2D LVIDd:         4.00 cm LVIDs:         2.80 cm LV PW:         1.00 cm LV IVS:        1.00 cm LVOT diam:     2.00 cm LV SV:         65 LV SV Index:   30 LVOT Area:     3.14 cm  RIGHT VENTRICLE TAPSE (M-mode): 1.9 cm LEFT ATRIUM              Index        RIGHT ATRIUM           Index LA diam:        4.00 cm  1.84 cm/m   RA Area:     19.70 cm LA Vol (A2C):   100.0 ml 45.99 ml/m  RA Volume:   59.30 ml  27.27 ml/m LA Vol (A4C):   84.7 ml  38.95 ml/m LA Biplane Vol: 94.4 ml  43.41 ml/m  AORTIC VALVE LVOT Vmax:   94.60 cm/s LVOT Vmean:  67.600 cm/s LVOT VTI:    0.206 m  AORTA Ao Root diam: 3.60 cm MITRAL VALVE                TRICUSPID VALVE MV Area (PHT): 3.65 cm     TR Peak grad:   22.1 mmHg MV Decel Time: 208 msec     TR Vmax:        235.00 cm/s MV E velocity: 127.00 cm/s                             SHUNTS                             Systemic VTI:  0.21 m                             Systemic Diam: 2.00 cm Vishnu Priya Mallipeddi Electronically signed by Winfield Rast Mallipeddi Signature Date/Time: 01/23/2023/4:35:46 PM    Final    CT HEAD WO CONTRAST ( )  Result Date: 01/23/2023 CLINICAL DATA:  Provided history: Syncope/presyncope, cerebrovascular cause suspected. EXAM: CT HEAD WITHOUT CONTRAST TECHNIQUE: Contiguous axial images were obtained from the base of the skull through the vertex without intravenous contrast.  RADIATION DOSE REDUCTION: This exam was performed according to the departmental dose-optimization program which includes automated exposure control, adjustment of the mA and/or kV according to patient size and/or use of iterative  reconstruction technique. COMPARISON:  Head CT 08/26/2022. FINDINGS: Brain: Moderate generalized cerebral atrophy. Mild cerebellar atrophy. Patchy and ill-defined hypoattenuation within the cerebral white matter, nonspecific but compatible with advanced chronic small vessel ischemic disease. There is no acute intracranial hemorrhage. No demarcated cortical infarct. No extra-axial fluid collection. No evidence of an intracranial mass. No midline shift. Vascular: No hyperdense vessel.  Atherosclerotic calcifications. Skull: No fracture or aggressive osseous lesion. Sinuses/Orbits: No mass or acute finding within the imaged orbits. Postsurgical changes to the globes bilaterally. Mucosal thickening within the bilateral ethmoid, sphenoid and maxillary sinuses, overall mild-to-moderate in severity. IMPRESSION: 1.  No evidence of an acute intracranial abnormality. 2. Parenchymal atrophy and chronic small ischemic disease, as described. 3. Paranasal sinus disease. Electronically Signed   By: Jackey Loge D.O.   On: 01/23/2023 15:38    Procedures Procedures    Medications Ordered in ED Medications  simvastatin (ZOCOR) tablet 20 mg (20 mg Oral Given 01/23/23 1741)  escitalopram (LEXAPRO) tablet 10 mg (10 mg Oral Given 01/24/23 0937)  tamsulosin (FLOMAX) capsule 0.4 mg (0.4 mg Oral Given 01/24/23 0936)  vitamin B-12 (CYANOCOBALAMIN) tablet 500 mcg (500 mcg Oral Given 01/24/23 0937)  melatonin tablet 9 mg (9 mg Oral Given 01/23/23 2222)  mometasone-formoterol (DULERA) 200-5 MCG/ACT inhaler 2 puff (2 puffs Inhalation Given 01/24/23 0717)  fluticasone (FLONASE) 50 MCG/ACT nasal spray 2 spray (2 sprays Each Nare Patient Refused/Not Given 01/24/23 0941)  acetaminophen (TYLENOL) tablet 650 mg (has  no administration in time range)    Or  acetaminophen (TYLENOL) suppository 650 mg (has no administration in time range)  prochlorperazine (COMPAZINE) injection 5 mg (has no administration in time range)  insulin aspart (novoLOG) injection 0-9 Units (1 Units Subcutaneous Given 01/24/23 1316)  0.9 %  sodium chloride infusion ( Intravenous Stopped 01/23/23 1740)  apixaban (ELIQUIS) tablet 2.5 mg (2.5 mg Oral Given 01/24/23 1316)  cephALEXin (KEFLEX) capsule 500 mg (500 mg Oral Given 01/24/23 1316)    ED Course/ Medical Decision Making/ A&P                             Medical Decision Making Amount and/or Complexity of Data Reviewed Labs: ordered.  Risk Decision regarding hospitalization.   89 year old male with a history of hypertension, CKD stage III, COPD, hyperlipidemia, diabetes mellitus type 2, GERD presenting with an episode of unresponsiveness.    Collateral history provided by EMS.  I also called the nursing home and they have provided significant information as well.  I reviewed patient's previous charts, including cardiology note and echocardiogram.  Essentially patient is coming in after an episode of unresponsiveness.  Questioning if this is syncope versus seizure.  He does have a history of stroke, that raises the chance of seizure.  He also has history of CAD and is noted to have QT prolongation, which raises the possibility of dysrhythmia.  Differential diagnosis for this patient essentially includes syncope versus seizure, severe electrolyte abnormality, arrhythmia, severe anemia, valvular disorder.  Plan is to get basic labs, CT scan of the brain and cervical spine and reassess the patient.  Reassessment: Patient continues to feel well.  I reviewed patient's cardiac telemetry monitoring.  He has had some atrial arrhythmia, but no concerning dysrhythmia.  Given the ambiguity in the history, plan is to proceed with admission request overnight for [facility] status for  syncope.    Final Clinical Impression(s) / ED Diagnoses Final diagnoses:  Syncope and collapse  QT prolongation    Rx / DC Orders ED Discharge Orders          Ordered    apixaban (ELIQUIS) 2.5 MG TABS tablet  2 times daily        01/24/23 1254    cephALEXin (KEFLEX) 500 MG capsule  Every 8 hours        01/24/23 1255              Derwood Kaplan, MD 01/24/23 1436

## 2023-01-24 NOTE — [place] FL2 (Signed)
[place] MEDICAID FL2 LEVEL OF CARE FORM     IDENTIFICATION  Patient Name: Casey Cooley Birthdate: 10/15/1932 Sex: male Admission Date (Current Location): 01/23/2023  County and Medicaid Number:  [place] [id number] Medicare Facility and Address:  [facility],  [address], [place] [address]      Provider Number: [id number]  Attending Physician Name and Address:  Tat, David, MD  Relative Name and Phone Number:       Current Level of Care: Hospital Recommended Level of Care: Assisted Living Facility Prior Approval Number:    Date Approved/Denied:   PASRR Number: N/A  Discharge Plan:  (ALF [facility])    Current Diagnoses: Patient Active Problem List   Diagnosis Date Noted   Syncope and collapse 01/23/2023   Cellulitis and abscess of leg 01/23/2023   Atrial flutter (HCC) 01/23/2023   Chronic kidney disease, stage 3b (HCC) 01/23/2023   COPD  ? GOLD 0 / mild AB   06/25/2020   Acute respiratory failure with hypoxia (HCC) 08/29/2019   Acute kidney injury superimposed on chronic kidney disease (HCC) 08/29/2019   Chronic non-seasonal allergic rhinitis 08/23/2019   Chronic constipation 08/23/2019   Major depression, recurrent, chronic (HCC) 08/23/2019   Increased intraocular pressure, bilateral 08/23/2019   GERD without esophagitis 08/23/2019   Physical deconditioning    Pressure injury of skin 08/17/2019   Generalized weakness    Pneumonia due to COVID-19 virus 08/13/2019   Facet degeneration of lumbar region 03/28/2018   Hyperlipemia 05/31/2009   Essential hypertension 05/31/2009    Orientation RESPIRATION BLADDER Height & Weight     Self, Place  Normal Continent Weight: 205 lb 0.4 oz (93 kg) Height:  6' 1" (185.4 cm)  BEHAVIORAL SYMPTOMS/MOOD NEUROLOGICAL BOWEL NUTRITION STATUS      Continent Diet  AMBULATORY STATUS COMMUNICATION OF NEEDS Skin   Supervision Verbally Skin abrasions                       Personal Care  Assistance Level of Assistance  Bathing, Feeding, Dressing Bathing Assistance: Limited assistance Feeding assistance: Limited assistance Dressing Assistance: Limited assistance     Functional Limitations Info  Hearing Sight Info: Adequate Hearing Info: Impaired      SPECIAL CARE FACTORS FREQUENCY                       Contractures Contractures Info: Not present    Additional Factors Info  Code Status Code Status Info: FULL             Current Medications (01/24/2023):  This is the current hospital active medication list Current Facility-Administered Medications  Medication Dose Route Frequency Provider Last Rate Last Admin   0.9 %  sodium chloride infusion   Intravenous PRN Tat, David, MD   Stopped at 01/23/23 1740   acetaminophen (TYLENOL) tablet 650 mg  650 mg Oral Q6H PRN Tat, David, MD       Or   acetaminophen (TYLENOL) suppository 650 mg  650 mg Rectal Q6H PRN Tat, David, MD       apixaban (ELIQUIS) tablet 2.5 mg  2.5 mg Oral BID Tat, David, MD   2.5 mg at 01/24/23 1316   cephALEXin (KEFLEX) capsule 500 mg  500 mg Oral Q8H Tat, David, MD   500 mg at 01/24/23 1316   escitalopram (LEXAPRO) tablet 10 mg  10 mg Oral Daily Tat, David, MD   10 mg at 01/24/23 0937     fluticasone (FLONASE) 50 MCG/ACT nasal spray 2 spray  2 spray Each Nare Daily Tat, David, MD   2 spray at 01/23/23 1740   insulin aspart (novoLOG) injection 0-9 Units  0-9 Units Subcutaneous TID WC Tat, David, MD   1 Units at 01/24/23 1316   melatonin tablet 9 mg  9 mg Oral QHS Tat, David, MD   9 mg at 01/23/23 2222   mometasone-formoterol (DULERA) 200-5 MCG/ACT inhaler 2 puff  2 puff Inhalation BID Tat, David, MD   2 puff at 01/24/23 0717   prochlorperazine (COMPAZINE) injection 5 mg  5 mg Intravenous Q6H PRN Tat, David, MD       simvastatin (ZOCOR) tablet 20 mg  20 mg Oral q1800 Tat, David, MD   20 mg at 01/23/23 1741   tamsulosin (FLOMAX) capsule 0.4 mg  0.4 mg Oral Daily Tat, David, MD   0.4 mg at  01/24/23 0936   vitamin B-12 (CYANOCOBALAMIN) tablet 500 mcg  500 mcg Oral Daily Tat, David, MD   500 mcg at 01/24/23 0937     Discharge Medications: Please see discharge summary for a list of discharge medications.  Relevant Imaging Results:  Relevant Lab Results:   Additional Information [id number]  Bethaney Oshana I Casey Howes, LCSW     

## 2023-01-24 NOTE — [place] FL2 (Deleted)
[place] MEDICAID FL2 LEVEL OF CARE FORM     IDENTIFICATION  Patient Name: Casey Cooley Birthdate: 1932-09-28 Sex: male Admission Date (Current Location): 01/23/2023  [place] and [place] Number:  Aaron Edelman [id number] Medicare Facility and Address:  [facility],  [address] [place] [address]      Provider Number: [id number]  Attending Physician Name and Address:  Catarina Hartshorn, MD  Relative Name and Phone Number:       Current Level of Care: Hospital Recommended Level of Care: Assisted Living Facility Prior Approval Number:    Date Approved/Denied:   PASRR Number: N/A  Discharge Plan:  ([facility])    Current Diagnoses: Patient Active Problem List   Diagnosis Date Noted   Syncope and collapse 01/23/2023   Cellulitis and abscess of leg 01/23/2023   Atrial flutter (HCC) 01/23/2023   Chronic kidney disease, stage 3b (HCC) 01/23/2023   COPD  ? GOLD 0 / mild AB   06/25/2020   Acute respiratory failure with hypoxia (HCC) 08/29/2019   Acute kidney injury superimposed on chronic kidney disease (HCC) 08/29/2019   Chronic non-seasonal allergic rhinitis 08/23/2019   Chronic constipation 08/23/2019   Major depression, recurrent, chronic (HCC) 08/23/2019   Increased intraocular pressure, bilateral 08/23/2019   GERD without esophagitis 08/23/2019   Physical deconditioning    Pressure injury of skin 08/17/2019   Generalized weakness    Pneumonia due to COVID-19 virus 08/13/2019   Facet degeneration of lumbar region 03/28/2018   Hyperlipemia 05/31/2009   Essential hypertension 05/31/2009    Orientation RESPIRATION BLADDER Height & Weight     Self, Place  Normal Continent Weight: 205 lb 0.4 oz (93 kg) Height:  6\' 1"  (185.4 cm)  BEHAVIORAL SYMPTOMS/MOOD NEUROLOGICAL BOWEL NUTRITION STATUS      Continent Diet  AMBULATORY STATUS COMMUNICATION OF NEEDS Skin   Supervision Verbally Skin abrasions                       Personal Care  Assistance Level of Assistance  Bathing, Feeding, Dressing Bathing Assistance: Limited assistance Feeding assistance: Limited assistance Dressing Assistance: Limited assistance     Functional Limitations Info  Hearing Sight Info: Adequate Hearing Info: Impaired      SPECIAL CARE FACTORS FREQUENCY                       Contractures Contractures Info: Not present    Additional Factors Info  Code Status Code Status Info: FULL             Current Medications (01/24/2023):  This is the current hospital active medication list Current Facility-Administered Medications  Medication Dose Route Frequency Provider Last Rate Last Admin   0.9 %  sodium chloride infusion   Intravenous PRN Tat, Onalee Hua, MD   Stopped at 01/23/23 1740   acetaminophen (TYLENOL) tablet 650 mg  650 mg Oral Q6H PRN Tat, David, MD       Or   acetaminophen (TYLENOL) suppository 650 mg  650 mg Rectal Q6H PRN Tat, Onalee Hua, MD       apixaban Everlene Balls) tablet 2.5 mg  2.5 mg Oral BID Tat, David, MD   2.5 mg at 01/24/23 1316   cephALEXin (KEFLEX) capsule 500 mg  500 mg Oral Andres Labrum, MD   500 mg at 01/24/23 1316   escitalopram (LEXAPRO) tablet 10 mg  10 mg Oral Daily Tat, Onalee Hua, MD   10 mg at 01/24/23 [id number]  fluticasone (FLONASE) 50 MCG/ACT nasal spray 2 spray  2 spray Each Nare Daily Tat, David, MD   2 spray at 01/23/23 1740   insulin aspart (novoLOG) injection 0-9 Units  0-9 Units Subcutaneous TID WC Tat, Onalee Hua, MD   1 Units at 01/24/23 1316   melatonin tablet 9 mg  9 mg Oral Maura Crandall, MD   9 mg at 01/23/23 2222   mometasone-formoterol (DULERA) 200-5 MCG/ACT inhaler 2 puff  2 puff Inhalation BID Tat, Onalee Hua, MD   2 puff at 01/24/23 0717   prochlorperazine (COMPAZINE) injection 5 mg  5 mg Intravenous Q6H PRN Tat, Onalee Hua, MD       simvastatin (ZOCOR) tablet 20 mg  20 mg Oral q1800 Tat, Onalee Hua, MD   20 mg at 01/23/23 1741   tamsulosin (FLOMAX) capsule 0.4 mg  0.4 mg Oral Daily Tat, David, MD   0.4 mg at  01/24/23 1610   vitamin B-12 (CYANOCOBALAMIN) tablet 500 mcg  500 mcg Oral Daily Catarina Hartshorn, MD   500 mcg at 01/24/23 9604     Discharge Medications: Please see discharge summary for a list of discharge medications.  Relevant Imaging Results:  Relevant Lab Results:   Additional Information [id number]  Catalina Gravel, LCSW

## 2023-01-24 NOTE — Progress Notes (Signed)
Patient slept most of the night. Patient is alert and oriented x3 this am. No complaints of pain or discomfort at this time.

## 2023-01-24 NOTE — Discharge Summary (Signed)
Physician Discharge Summary   Patient: Casey Cooley MRN: [id number] DOB: 11/19/1932  Admit date:     01/23/2023  Discharge date: 01/24/23  Discharge Physician: Onalee Hua Gage Weant   PCP: Pcp, No   Recommendations at discharge:   Please follow up with primary care provider within 1-2 weeks  Please repeat BMP and CBC in one week Remove ZIO patch (heart monitor) from patient's chest on 02/06/23 and place device in pre-addressed package and send back to company for interpretation Please schedule appointment with [facility] cardiology for follow up in 2 weeks in [facility] Course: 89 year old male with a history of hypertension, CKD stage III, COPD, hyperlipidemia, diabetes mellitus type 2, GERD presenting with an episode of unresponsiveness.  The patient resides at [facility].  Apparently the patient woke up and went to his usual routine and had breakfast.  Apparently the patient was sitting in his wheelchair outside smoking when the staff found the patient to be unresponsive.  EMS was activated.  Upon EMS arrival, patient was noted to have blood pressure of 86/60.  The patient recalled people trying to wake him up.  He denied biting his tongue or bowel or bladder incontinence.  There is no witnessed tonic-clonic activity.  The patient was given a 500 cc bolus of fluid on the way to the hospital.  Upon arrival to the hospital, the patient was alert and talkative and following commands. Patient denies fevers, chills, headache, chest pain, dyspnea, nausea, vomiting, diarrhea, abdominal pain, dysuria, hematuria, hematochezia, and melena. In the ED, the patient was afebrile hemodynamically stable with oxygen saturation 97-99% on room air.  WBC 7.3, hemoglobin 11.4, platelets 153,000.  Sodium 138, potassium 4.6, bicarbonate 25, serum creatinine 1.7.  Troponin 6>>7. EKG showed a flutter with nonspecific ST changes.  Assessment and Plan: Syncope -etiology likely related to new afib/flutter and  orthostasis -CT brain neg -orthostatics--positive for pulse--HR went from 64>>84 -IVF given -6/15 repeat orthostatics neg -01/23/13 echo-EF 60-65%, now WMA, normal RVF, no AS -telemetry--personally reviewed during hospitalization>>aflutter, rate controlled 70s to 80s -ZIO patch was placed on patient.  Please remove on 02/06/23, place device in pre-addressed package and mail back to company for interpretation   Hypotension -likely volume depletion -improved with IVF -remained afebrile without leukocytosis -check lactate 1.0 -check PCT <0.10 -CXR--personally reviewed--no infiltrates or edema   Cellulitis right leg -started cefazolin>>improving -d/c to ALF with cephalexin x 6 more days  Atrial flutter--new onset -CHADSVASc = 4 (age, HTN, DM) -apixaban 2.5 mg bid started -rate controlled throughout the hospitalization with HRs 70-80 --01/23/13 echo-EF 60-65%, now WMA, normal RVF, no AS -outpt cardiology follow up   HTN -hold losartan due to hypotension -BP improved with IVF -will not restart losartan as BP is well controlled off losartan   COPD -stable on RA   CKD 3b -baseline creatinine 1.6-1.9 -serum creatinine 1.74 on day of dc   DM2 -ISS -01/24/23  A1C 6.0 -hold glipizide>>restart after d/c        Consultants: none Procedures performed: none  Disposition: Assisted living Diet recommendation:  Cardiac diet DISCHARGE MEDICATION: Allergies as of 01/24/2023   No Known Allergies      Medication List     STOP taking these medications    losartan 100 MG tablet Commonly known as: COZAAR       TAKE these medications    acetaminophen 325 MG tablet Commonly known as: TYLENOL Take 650 mg by mouth 2 (two) times daily.   apixaban 2.5  MG Tabs tablet Commonly known as: ELIQUIS Take 1 tablet (2.5 mg total) by mouth 2 (two) times daily.   budesonide-formoterol 160-4.5 MCG/ACT inhaler Commonly known as: SYMBICORT Inhale 2 puffs into the lungs 2 (two) times  daily.   calcium carbonate 1500 (600 Ca) MG Tabs tablet Commonly known as: OSCAL Take 600 mg of elemental calcium by mouth daily.   cephALEXin 500 MG capsule Commonly known as: KEFLEX Take 1 capsule (500 mg total) by mouth every 8 (eight) hours.   cholecalciferol 25 MCG (1000 UNIT) tablet Commonly known as: VITAMIN D3 Take 2,000 Units by mouth daily.   docusate sodium 100 MG capsule Commonly known as: COLACE Take 100 mg by mouth daily.   escitalopram 10 MG tablet Commonly known as: LEXAPRO Take 1 tablet (10 mg total) by mouth daily.   fluticasone 50 MCG/ACT nasal spray Commonly known as: FLONASE Place 2 sprays into both nostrils daily as needed for allergies or rhinitis. What changed: when to take this   furosemide 40 MG tablet Commonly known as: LASIX Take 40 mg by mouth daily.   glipiZIDE 2.5 MG 24 hr tablet Commonly known as: GLUCOTROL XL Take 2.5 mg by mouth daily with breakfast.   lidocaine 4 % Place 1 patch onto the skin daily. Apply 1 patch to mid lower back every day for 12 hours on and 12 hours off.   LORazepam 0.5 MG tablet Commonly known as: ATIVAN Take 0.25 mg by mouth 2 (two) times daily.   Melatonin 10 MG Tabs Take 10 mg by mouth at bedtime.   methocarbamol 500 MG tablet Commonly known as: ROBAXIN Take 500 mg by mouth at bedtime.   mirtazapine 15 MG tablet Commonly known as: REMERON Take 15 mg by mouth at bedtime.   multivitamin with minerals Tabs tablet Take 1 tablet by mouth daily.   Omeprazole-Sodium Bicarbonate 20-1100 MG Caps capsule Commonly known as: Zegerid OTC Take 1 capsule by mouth daily before breakfast.   ondansetron 4 MG tablet Commonly known as: ZOFRAN Take 4 mg by mouth every 8 (eight) hours as needed for nausea or vomiting.   polyethylene glycol powder 17 GM/SCOOP powder Commonly known as: GLYCOLAX/MIRALAX Take 17 g by mouth daily as needed for mild constipation or moderate constipation.   simvastatin 20 MG  tablet Commonly known as: ZOCOR Take 1 tablet (20 mg total) by mouth daily at 6 PM.   sodium chloride 5 % ophthalmic solution Commonly known as: MURO 128 Place 1 drop into both eyes 2 (two) times daily.   tamsulosin 0.4 MG Caps capsule Commonly known as: FLOMAX Take 0.4 mg by mouth daily.   vitamin B-12 100 MCG tablet Commonly known as: CYANOCOBALAMIN Take 500 mcg by mouth daily.   zinc oxide 20 % ointment Apply 1 Application topically daily as needed (Redness).        Discharge Exam: Filed Weights   01/23/23 1023  Weight: 93 kg   HEENT:  [place]/AT, No thrush, no icterus CV:  irregular Lung:  CTA, no wheeze, no rhonchi Abd:  soft/+BS, NT Ext:  1 + LE edema, no lymphangitis, no synovitis, mild erythema right lower pretibial area to foot without open/draining wounds   Condition at discharge: stable  The results of significant diagnostics from this hospitalization (including imaging, microbiology, ancillary and laboratory) are listed below for reference.   Imaging Studies: DG Chest 1 View  Addendum Date: 01/23/2023   ADDENDUM REPORT: 01/23/2023 23:45 ADDENDUM: These results were called by telephone at the time of  interpretation on 01/23/2023 at 1:37 Pm to provider Jarielys Girardot , who verbally acknowledged these results. Electronically Signed   By: Darliss Cheney M.D.   On: 01/23/2023 23:45   Result Date: 01/23/2023 CLINICAL DATA:  Syncope, collapse. EXAM: CHEST  1 VIEW COMPARISON:  Chest x-ray 08/03/2022 FINDINGS: Lung volumes are low. There some linear opacities in the lung bases favored is atelectasis. Curvilinear density paralleling the right hemidiaphragm is indeterminate. There is no pleural effusion or pneumothorax. The cardiomediastinal silhouette is stable and within normal limits for projection. No acute fractures are seen. IMPRESSION: 1. Low lung volumes with bibasilar atelectasis. 2. Curvilinear density paralleling the right hemidiaphragm is indeterminate and could represent  atelectasis. Free air is not excluded in the appropriate clinical setting. If there is high clinical concern consider follow-up CT of the abdomen and pelvis or decubitus views of the abdomen. Electronically Signed: By: Darliss Cheney M.D. On: 01/23/2023 15:27   CT ABDOMEN PELVIS WO CONTRAST  Result Date: 01/23/2023 CLINICAL DATA:  Possible free intraperitoneal air on recent chest radiograph. EXAM: CT ABDOMEN AND PELVIS WITHOUT CONTRAST TECHNIQUE: Multidetector CT imaging of the abdomen and pelvis was performed following the standard protocol without IV contrast. RADIATION DOSE REDUCTION: This exam was performed according to the departmental dose-optimization program which includes automated exposure control, adjustment of the mA and/or kV according to patient size and/or use of iterative reconstruction technique. COMPARISON:  Chest radiograph on 01/23/2023 FINDINGS: Lower chest: No acute findings. Hepatobiliary: No mass visualized on this unenhanced exam. Gallbladder is unremarkable. No evidence of biliary ductal dilatation. Pancreas: No mass or inflammatory process visualized on this unenhanced exam. Spleen:  Within normal limits in size. Adrenals/Urinary tract: No evidence of urolithiasis or hydronephrosis. Unremarkable unopacified urinary bladder. Stomach/Bowel: No evidence of free intraperitoneal air. No evidence of obstruction, inflammatory process, or abnormal fluid collections. Diverticulosis is seen mainly involving the sigmoid colon, however there is no evidence of diverticulitis. A small paraumbilical ventral hernia is seen containing a small bowel loop, however there is no evidence of bowel obstruction or strangulation. Vascular/Lymphatic: No pathologically enlarged lymph nodes identified. No evidence of abdominal aortic aneurysm. Aortic atherosclerotic calcification incidentally noted. Reproductive:  No mass or other significant abnormality. Other:  None. Musculoskeletal:  No suspicious bone lesions  identified. IMPRESSION: No evidence of free intraperitoneal air or other acute findings. Small paraumbilical ventral hernia containing a small bowel loop. No evidence of bowel obstruction or strangulation. Colonic diverticulosis, without radiographic evidence of diverticulitis. Electronically Signed   By: Danae Orleans M.D.   On: 01/23/2023 19:56   ECHOCARDIOGRAM COMPLETE  Result Date: 01/23/2023    ECHOCARDIOGRAM REPORT   Patient Name:   Casey Cooley Date of Exam: 01/23/2023 Medical Rec #:  [id number]      Height:       73.0 in Accession #:    [id number]     Weight:       205.0 lb Date of Birth:  1933/05/31     BSA:          2.174 m Patient Age:    89 years       BP:           149/80 mmHg Patient Gender: M              HR:           81 bpm. [facility] Procedure: 2D Echo, Cardiac Doppler and Color Doppler Indications:    Syncope R55  History:  Patient has prior history of Echocardiogram examinations, most                 recent 06/26/2020. COPD, Arrythmias:Atrial Flutter; Risk                 Factors:Hypertension and Dyslipidemia. Hx of pneumonia due to                 COVID-19 virus.  Sonographer:    Celesta Gentile RCS Referring Phys: [phone] Julene Rahn  Sonographer Comments: No subcostal or suprasternal windows. Patient abdomen is Very hard, difficult to image. No IVC images. IMPRESSIONS  1. Left ventricular ejection fraction, by estimation, is 60 to 65%. The left ventricle has normal function. The left ventricle has no regional wall motion abnormalities. Left ventricular diastolic function could not be evaluated.  2. Right ventricular systolic function is normal. The right ventricular size is normal.  3. Left atrial size was mild to moderately dilated.  4. The mitral valve is normal in structure. No evidence of mitral valve regurgitation. No evidence of mitral stenosis.  5. The aortic valve is tricuspid. Aortic valve regurgitation is not visualized. No aortic stenosis is present.  6. Aortic  dilatation noted. There is borderline dilatation of the aortic root, measuring 36 mm. Comparison(s): No significant change from prior study. FINDINGS  Left Ventricle: Left ventricular ejection fraction, by estimation, is 60 to 65%. The left ventricle has normal function. The left ventricle has no regional wall motion abnormalities. The left ventricular internal cavity size was normal in size. There is  no left ventricular hypertrophy. Left ventricular diastolic function could not be evaluated due to atrial fibrillation. Left ventricular diastolic function could not be evaluated. Right Ventricle: The right ventricular size is normal. No increase in right ventricular wall thickness. Right ventricular systolic function is normal. Left Atrium: Left atrial size was mild to moderately dilated. Right Atrium: Right atrial size was normal in size. Pericardium: There is no evidence of pericardial effusion. Mitral Valve: The mitral valve is normal in structure. No evidence of mitral valve regurgitation. No evidence of mitral valve stenosis. Tricuspid Valve: The tricuspid valve is normal in structure. Tricuspid valve regurgitation is not demonstrated. No evidence of tricuspid stenosis. Aortic Valve: The aortic valve is tricuspid. Aortic valve regurgitation is not visualized. No aortic stenosis is present. Pulmonic Valve: The pulmonic valve was normal in structure. Pulmonic valve regurgitation is not visualized. No evidence of pulmonic stenosis. Aorta: Aortic dilatation noted. There is borderline dilatation of the aortic root, measuring 36 mm. Venous: The inferior vena cava was not well visualized. IAS/Shunts: No atrial level shunt detected by color flow Doppler.  LEFT VENTRICLE PLAX 2D LVIDd:         4.00 cm LVIDs:         2.80 cm LV PW:         1.00 cm LV IVS:        1.00 cm LVOT diam:     2.00 cm LV SV:         65 LV SV Index:   30 LVOT Area:     3.14 cm  RIGHT VENTRICLE TAPSE (M-mode): 1.9 cm LEFT ATRIUM              Index         RIGHT ATRIUM           Index LA diam:        4.00 cm  1.84 cm/m   RA Area:  19.70 cm LA Vol (A2C):   100.0 ml 45.99 ml/m  RA Volume:   59.30 ml  27.27 ml/m LA Vol (A4C):   84.7 ml  38.95 ml/m LA Biplane Vol: 94.4 ml  43.41 ml/m  AORTIC VALVE LVOT Vmax:   94.60 cm/s LVOT Vmean:  67.600 cm/s LVOT VTI:    0.206 m  AORTA Ao Root diam: 3.60 cm MITRAL VALVE                TRICUSPID VALVE MV Area (PHT): 3.65 cm     TR Peak grad:   22.1 mmHg MV Decel Time: 208 msec     TR Vmax:        235.00 cm/s MV E velocity: 127.00 cm/s                             SHUNTS                             Systemic VTI:  0.21 m                             Systemic Diam: 2.00 cm Vishnu Priya Mallipeddi Electronically signed by Winfield Rast Mallipeddi Signature Date/Time: 01/23/2023/4:35:46 PM    Final    CT HEAD WO CONTRAST ( )  Result Date: 01/23/2023 CLINICAL DATA:  Provided history: Syncope/presyncope, cerebrovascular cause suspected. EXAM: CT HEAD WITHOUT CONTRAST TECHNIQUE: Contiguous axial images were obtained from the base of the skull through the vertex without intravenous contrast. RADIATION DOSE REDUCTION: This exam was performed according to the departmental dose-optimization program which includes automated exposure control, adjustment of the mA and/or kV according to patient size and/or use of iterative reconstruction technique. COMPARISON:  Head CT 08/26/2022. FINDINGS: Brain: Moderate generalized cerebral atrophy. Mild cerebellar atrophy. Patchy and ill-defined hypoattenuation within the cerebral white matter, nonspecific but compatible with advanced chronic small vessel ischemic disease. There is no acute intracranial hemorrhage. No demarcated cortical infarct. No extra-axial fluid collection. No evidence of an intracranial mass. No midline shift. Vascular: No hyperdense vessel.  Atherosclerotic calcifications. Skull: No fracture or aggressive osseous lesion. Sinuses/Orbits: No mass or acute finding within the  imaged orbits. Postsurgical changes to the globes bilaterally. Mucosal thickening within the bilateral ethmoid, sphenoid and maxillary sinuses, overall mild-to-moderate in severity. IMPRESSION: 1.  No evidence of an acute intracranial abnormality. 2. Parenchymal atrophy and chronic small ischemic disease, as described. 3. Paranasal sinus disease. Electronically Signed   By: Jackey Loge D.O.   On: 01/23/2023 15:38    Microbiology: Results for orders placed or performed during the hospital encounter of 01/23/23  Culture, blood (Routine X 2) w Reflex to ID Panel     Status: None (Preliminary result)   Collection Time: 01/23/23  3:26 PM   Specimen: BLOOD RIGHT ARM  Result Value Ref Range Status   Specimen Description   Final    BLOOD RIGHT ARM BOTTLES DRAWN AEROBIC AND ANAEROBIC   Special Requests Blood Culture adequate volume  Final   Culture   Final    NO GROWTH < 24 HOURS Performed at [facility], [address], [place], [place] [address]    Report Status PENDING  Incomplete  Culture, blood (Routine X 2) w Reflex to ID Panel     Status: None (Preliminary result)   Collection Time: 01/23/23  3:34 PM  Specimen: BLOOD LEFT ARM  Result Value Ref Range Status   Specimen Description BLOOD LEFT ARM BOTTLES DRAWN AEROBIC ONLY  Final   Special Requests Blood Culture adequate volume  Final   Culture   Final    NO GROWTH < 24 HOURS Performed at [facility], [address], [place], [place] [address]    Report Status PENDING  Incomplete    Labs: CBC: Recent Labs  Lab 01/23/23 1038 01/24/23 0331  WBC 7.3 6.0  HGB 11.4* 10.5*  HCT 37.3* 34.0*  MCV 105.1* 102.1*  PLT 153 181   Basic Metabolic Panel: Recent Labs  Lab 01/23/23 1038 01/24/23 0331  NA 138 137  K 4.6 4.7  CL 106 105  CO2 25 25  GLUCOSE 85 98  BUN 39* 36*  CREATININE 1.87* 1.74*  CALCIUM 8.2* 8.1*   Liver Function Tests: Recent Labs  Lab 01/24/23 0331  AST 17  ALT 11  ALKPHOS 82  BILITOT 0.4  PROT 6.4*   ALBUMIN 3.0*   CBG: Recent Labs  Lab 01/23/23 1615 01/23/23 2122 01/24/23 0725 01/24/23 1109  GLUCAP 81 88 82 132*    Discharge time spent: greater than 30 minutes.  Signed: Catarina Hartshorn, MD [facility] 01/24/2023

## 2023-01-24 NOTE — TOC Transition Note (Addendum)
Transition of Care [facility]) - CM/SW Discharge Note   Patient Details  Name: Casey Cooley MRN: [id number] Date of Birth: 07-09-1933  Transition of Care [facility]) CM/SW Contact:  Catalina Gravel, LCSW Phone Number: 01/24/2023, 1:30 PM   Clinical Narrative:    CSW discussed DC during progression with MD, need to determine if weekend DC as pt  medically clear.  CSW contacted facility to determine pt return eligibility and Saturday ability.  Facility returned call stated that they accept pt on Sat but need to review DC summary and also need any new scripts sent to their pharmacy- [facility] by [employer].  CSW met with pt at bedside to confirm that his plan is to return to ALF and he stated yes. CSW made a couple of family contact calls, no answer left message.  The niece number on file was a male voice- did not leave identifiable pt info but stated from [facility] and arranging transport for a pt.    Secure chat to MD who finalized DC summary and sent scripts to pharmacy once info provided.  CSW then faxed DC to [facility] at ALF and asked that she advise when pt authorized to return and I can arrange transport.  CSW consulted RN who stated pt requires EMS transport back to facility not [facility] transport.   Update- RN contacted CSW- pt states he has taken wc [facility] and that works for him. EMS not engaged, medical sec to arrange [place] per RN.   Final next level of care: Assisted Living Barriers to Discharge: No Barriers Identified   Patient Goals and CMS Choice      Discharge Placement                Patient chooses bed at:  ([facility]) Patient to be transferred to facility by: [facility]      Discharge Plan and Services Additional resources added to the After Visit Summary for                                       Social Determinants of Health (SDOH) Interventions SDOH Screenings   Tobacco Use: Medium Risk (01/23/2023)     Readmission Risk Interventions      No data to display

## 2023-01-25 ENCOUNTER — Emergency Department (HOSPITAL_COMMUNITY): Payer: Medicare Other

## 2023-01-25 ENCOUNTER — Encounter (HOSPITAL_COMMUNITY): Payer: Self-pay | Admitting: Emergency Medicine

## 2023-01-25 ENCOUNTER — Emergency Department (HOSPITAL_COMMUNITY)
Admission: EM | Admit: 2023-01-25 | Discharge: 2023-01-26 | Disposition: A | Discharge status: Home / Self Care | Payer: Medicare Other | Attending: Emergency Medicine | Admitting: Emergency Medicine

## 2023-01-25 ENCOUNTER — Other Ambulatory Visit: Payer: Self-pay

## 2023-01-25 DIAGNOSIS — W01198A Fall on same level from slipping, tripping and stumbling with subsequent striking against other object, initial encounter: Secondary | ICD-10-CM | POA: Diagnosis not present

## 2023-01-25 DIAGNOSIS — S0101XA Laceration without foreign body of scalp, initial encounter: Secondary | ICD-10-CM | POA: Diagnosis present

## 2023-01-25 DIAGNOSIS — W19XXXA Unspecified fall, initial encounter: Secondary | ICD-10-CM

## 2023-01-25 LAB — CULTURE, BLOOD (ROUTINE X 2): Culture: NO GROWTH

## 2023-01-25 NOTE — Discharge Instructions (Signed)
Clean laceration twice a day gently with soap and water and have the staples removed in 1 week

## 2023-01-25 NOTE — ED Triage Notes (Signed)
Pt states he was reaching for urinal and fell backwards. Pt denies any loc, but c/o head pain. Ems placed c-collar. Pt just started eliquis.

## 2023-01-25 NOTE — ED Provider Notes (Signed)
[place] EMERGENCY DEPARTMENT AT [facility] Provider Note   CSN: [id number] Arrival date & time: 01/25/23  1726     History {Add pertinent medical, surgical, social history, OB history to HPI:1} Chief Complaint  Patient presents with   Casey Cooley is a 89 y.o. male.  Patient has a history of hyperlipidemia.  He accidentally fell and hit his head no loss consciousness   Fall       Home Medications Prior to Admission medications   Medication Sig Start Date End Date Taking? Authorizing Provider  acetaminophen (TYLENOL) 325 MG tablet Take 650 mg by mouth 2 (two) times daily.    [provider]  apixaban (ELIQUIS) 2.5 MG TABS tablet Take 1 tablet (2.5 mg total) by mouth 2 (two) times daily. 01/24/23   Catarina Hartshorn, MD  budesonide-formoterol (SYMBICORT) 160-4.5 MCG/ACT inhaler Inhale 2 puffs into the lungs 2 (two) times daily.    [provider]  calcium carbonate (OSCAL) 1500 (600 Ca) MG TABS tablet Take 600 mg of elemental calcium by mouth daily.    [provider]  cephALEXin (KEFLEX) 500 MG capsule Take 1 capsule (500 mg total) by mouth every 8 (eight) hours. 01/24/23   Catarina Hartshorn, MD  cholecalciferol (VITAMIN D3) 25 MCG (1000 UT) tablet Take 2,000 Units by mouth daily.    [provider]  docusate sodium (COLACE) 100 MG capsule Take 100 mg by mouth daily.    [provider]  escitalopram (LEXAPRO) 10 MG tablet Take 1 tablet (10 mg total) by mouth daily. 09/07/19   Margit Hanks, MD  fluticasone Aleda Grana) 50 MCG/ACT nasal spray Place 2 sprays into both nostrils daily as needed for allergies or rhinitis. Patient taking differently: Place 2 sprays into both nostrils daily. 09/07/19   Margit Hanks, MD  furosemide (LASIX) 40 MG tablet Take 40 mg by mouth daily.    [provider]  glipiZIDE (GLUCOTROL XL) 2.5 MG 24 hr tablet Take 2.5 mg by mouth daily with breakfast.    [provider]   lidocaine 4 % Place 1 patch onto the skin daily. Apply 1 patch to mid lower back every day for 12 hours on and 12 hours off.    [provider]  LORazepam (ATIVAN) 0.5 MG tablet Take 0.25 mg by mouth 2 (two) times daily.    [provider]  Melatonin 10 MG TABS Take 10 mg by mouth at bedtime.    [provider]  methocarbamol (ROBAXIN) 500 MG tablet Take 500 mg by mouth at bedtime.    [provider]  mirtazapine (REMERON) 15 MG tablet Take 15 mg by mouth at bedtime.    [provider]  Multiple Vitamin (MULTIVITAMIN WITH MINERALS) TABS tablet Take 1 tablet by mouth daily.    [provider]  Omeprazole-Sodium Bicarbonate (ZEGERID OTC) 20-1100 MG CAPS capsule Take 1 capsule by mouth daily before breakfast. 09/07/19   Margit Hanks, MD  ondansetron (ZOFRAN) 4 MG tablet Take 4 mg by mouth every 8 (eight) hours as needed for nausea or vomiting.    [provider]  polyethylene glycol powder (GLYCOLAX/MIRALAX) 17 GM/SCOOP powder Take 17 g by mouth daily as needed for mild constipation or moderate constipation. 09/07/19   Margit Hanks, MD  simvastatin (ZOCOR) 20 MG tablet Take 1 tablet (20 mg total) by mouth daily at 6 PM. 09/07/19   Margit Hanks, MD  sodium chloride (MURO 128) 5 %  ophthalmic solution Place 1 drop into both eyes 2 (two) times daily.    [provider]  tamsulosin (FLOMAX) 0.4 MG CAPS capsule Take 0.4 mg by mouth daily.    [provider]  vitamin B-12 (CYANOCOBALAMIN) 100 MCG tablet Take 500 mcg by mouth daily.    [provider]  zinc oxide 20 % ointment Apply 1 Application topically daily as needed (Redness).    [provider]      Allergies    Patient has no known allergies.    Review of Systems   Review of Systems  Physical Exam Updated Vital Signs BP (!) 158/82 (BP Location: Right Arm)   Pulse 84   Temp (!) 97.5 F (36.4 C) (Oral)   Resp 16   Ht 6\' 1"  (1.854  m)   Wt 93 kg   SpO2 98%   BMI 27.05 kg/m  Physical Exam  ED Results / Procedures / Treatments   Labs (all labs ordered are listed, but only abnormal results are displayed) Labs Reviewed - No data to display  EKG None  Radiology CT Head Wo Contrast  Result Date: 01/25/2023 CLINICAL DATA:  Recent fall with headaches and neck pain, initial encounter EXAM: CT HEAD WITHOUT CONTRAST CT CERVICAL SPINE WITHOUT CONTRAST TECHNIQUE: Multidetector CT imaging of the head and cervical spine was performed following the standard protocol without intravenous contrast. Multiplanar CT image reconstructions of the cervical spine were also generated. RADIATION DOSE REDUCTION: This exam was performed according to the departmental dose-optimization program which includes automated exposure control, adjustment of the mA and/or kV according to patient size and/or use of iterative reconstruction technique. COMPARISON:  01/23/2023 FINDINGS: CT HEAD FINDINGS Brain: No evidence of acute infarction, hemorrhage, hydrocephalus, extra-axial collection or mass lesion/mass effect. Chronic atrophic and ischemic changes are again noted. Vascular: No hyperdense vessel or unexpected calcification. Skull: Normal. Negative for fracture or focal lesion. Sinuses/Orbits: Postsurgical changes are noted in the orbits bilaterally. Paranasal sinuses show no mucosal changes stable from prior exam. Other: Scalp swelling is noted posteriorly near the vertex consistent with the recent injury. CT CERVICAL SPINE FINDINGS Alignment: Within normal limits. Skull base and vertebrae: In cervical segments are well visualized. Vertebral body height is well maintained. Disc space narrowing with osteophytic change is seen at C6-7. Multilevel facet hypertrophic changes are noted. No acute fracture acute facet abnormality is noted. Soft tissues and spinal canal: Surrounding soft tissue structures are within normal limits. Upper chest: Visualized lung apices are  unremarkable. Other: None IMPRESSION: CT of the head: Chronic atrophic and ischemic changes. No acute intracranial abnormality noted. Scalp swelling posteriorly consistent with the recent injury. CT of the cervical spine: Multilevel degenerative changes without acute abnormality. Electronically Signed   By: Alcide Clever M.D.   On: 01/25/2023 20:09   CT Cervical Spine Wo Contrast  Result Date: 01/25/2023 CLINICAL DATA:  Recent fall with headaches and neck pain, initial encounter EXAM: CT HEAD WITHOUT CONTRAST CT CERVICAL SPINE WITHOUT CONTRAST TECHNIQUE: Multidetector CT imaging of the head and cervical spine was performed following the standard protocol without intravenous contrast. Multiplanar CT image reconstructions of the cervical spine were also generated. RADIATION DOSE REDUCTION: This exam was performed according to the departmental dose-optimization program which includes automated exposure control, adjustment of the mA and/or kV according to patient size and/or use of iterative reconstruction technique. COMPARISON:  01/23/2023 FINDINGS: CT HEAD FINDINGS Brain: No evidence of acute infarction, hemorrhage, hydrocephalus, extra-axial collection or mass lesion/mass effect. Chronic atrophic and  ischemic changes are again noted. Vascular: No hyperdense vessel or unexpected calcification. Skull: Normal. Negative for fracture or focal lesion. Sinuses/Orbits: Postsurgical changes are noted in the orbits bilaterally. Paranasal sinuses show no mucosal changes stable from prior exam. Other: Scalp swelling is noted posteriorly near the vertex consistent with the recent injury. CT CERVICAL SPINE FINDINGS Alignment: Within normal limits. Skull base and vertebrae: In cervical segments are well visualized. Vertebral body height is well maintained. Disc space narrowing with osteophytic change is seen at C6-7. Multilevel facet hypertrophic changes are noted. No acute fracture acute facet abnormality is noted. Soft tissues  and spinal canal: Surrounding soft tissue structures are within normal limits. Upper chest: Visualized lung apices are unremarkable. Other: None IMPRESSION: CT of the head: Chronic atrophic and ischemic changes. No acute intracranial abnormality noted. Scalp swelling posteriorly consistent with the recent injury. CT of the cervical spine: Multilevel degenerative changes without acute abnormality. Electronically Signed   By: Alcide Clever M.D.   On: 01/25/2023 20:09    Procedures Procedures  {Document cardiac monitor, telemetry assessment procedure when appropriate:1}  Medications Ordered in ED Medications - No data to display  ED Course/ Medical Decision Making/ A&P   {Patient had a 1 cm laceration to the occipital area that was stapled with 2 staples. Click here for ABCD2, HEART and other calculatorsREFRESH Note before signing :1}                          Medical Decision Making Amount and/or Complexity of Data Reviewed Radiology: ordered.   Fall with minor head injury.  Patient will follow-up as outpatient {Document critical care time when appropriate:1} {Document review of labs and clinical decision tools ie heart score, Chads2Vasc2 etc:1}  {Document your independent review of radiology images, and any outside records:1} {Document your discussion with family members, caretakers, and with consultants:1} {Document social determinants of health affecting pt's care:1} {Document your decision making why or why not admission, treatments were needed:1} Final Clinical Impression(s) / ED Diagnoses Final diagnoses:  Fall, initial encounter    Rx / DC Orders ED Discharge Orders     None

## 2023-01-25 NOTE — ED Notes (Signed)
Nursing secretary called for transport back to [facility] of [address]. Pt is resting

## 2023-01-25 NOTE — ED Notes (Signed)
[facility] notified of patient needing transportation back to [place] of [address].

## 2023-01-25 NOTE — ED Notes (Signed)
Laceration to the back of his head.

## 2023-01-26 LAB — CULTURE, BLOOD (ROUTINE X 2): Special Requests: ADEQUATE

## 2023-01-26 NOTE — ED Notes (Signed)
Ice water given.  TV dinner given and coffee given.;  pt repositioned for comfort

## 2023-01-26 NOTE — ED Notes (Signed)
Pt was changed and placed in new depend and back in his clothing.  Gave new warm blankets for comfort and offered pt water and gave him a fresh coffee.  Breakfast tray ordered

## 2023-01-27 LAB — CULTURE, BLOOD (ROUTINE X 2): Special Requests: ADEQUATE

## 2023-01-28 LAB — CULTURE, BLOOD (ROUTINE X 2): Culture: NO GROWTH

## 2023-01-29 ENCOUNTER — Encounter: Payer: Self-pay | Admitting: Internal Medicine

## 2023-01-29 ENCOUNTER — Ambulatory Visit: Payer: Medicare Other | Attending: Internal Medicine | Admitting: Internal Medicine

## 2023-01-29 VITALS — BP 110/62 | HR 84 | Ht 73.0 in | Wt 206.4 lb

## 2023-01-29 DIAGNOSIS — M7989 Other specified soft tissue disorders: Secondary | ICD-10-CM | POA: Diagnosis not present

## 2023-01-29 NOTE — Progress Notes (Signed)
Cardiology Office Note  Date: 01/29/2023   ID: Casey Cooley, Casey Cooley 08/20/1932, MRN [id number]  PCP:  Pcp, No  Cardiologist:  Casey Bicker, MD Electrophysiologist:  None   Reason for Office Visit: Management of atrial flutter   History of Present Illness: Casey Cooley is a 89 y.o. male known to have permanent atrial flutter diagnosed in 08/2022, history of CVA, HTN was referred to cardiology clinic for management of atrial flutter.  He was seen by Dr. Sharrell Cooley at [facility] in 10/2022 due to history of CVA and new onset of permanent atrial flutter. Patient refused any invasive procedures. After discussing with the family, consensus was to discontinue systemic anticoagulation to prevent any injuries from the falls. Patient was also having frequent falls and had a head injury recently. He denies any symptoms of DOE, angina, dizziness, lightheadedness, palpitations and syncope. He does have bilateral lower EXTR swelling.  Past Medical History:  Diagnosis Date   BPH (benign prostatic hyperplasia)    per MAR   CKD (chronic kidney disease)    COPD (chronic obstructive pulmonary disease) (HCC)    Depression    Diabetes mellitus without complication (HCC)    GERD (gastroesophageal reflux disease)    Hypercholesterolemia    Hyperlipidemia    Hypertension    RETINAL DETACHMENT, BILATERAL, HX OF 05/31/2009   Qualifier: Diagnosis of  By: Vernie Murders      Past Surgical History:  Procedure Laterality Date   EYE SURGERY     FOOT SURGERY      Current Outpatient Medications  Medication Sig Dispense Refill   acetaminophen (TYLENOL) 325 MG tablet Take 650 mg by mouth 2 (two) times daily.     budesonide-formoterol (SYMBICORT) 160-4.5 MCG/ACT inhaler Inhale 2 puffs into the lungs 2 (two) times daily.     calcium carbonate (OSCAL) 1500 (600 Ca) MG TABS tablet Take 600 mg of elemental calcium by mouth daily.     cephALEXin (KEFLEX) 500 MG capsule Take 1 capsule (500 mg total) by mouth every  8 (eight) hours. 18 capsule 0   cholecalciferol (VITAMIN D3) 25 MCG (1000 UT) tablet Take 2,000 Units by mouth daily.     docusate sodium (COLACE) 100 MG capsule Take 100 mg by mouth daily.     escitalopram (LEXAPRO) 10 MG tablet Take 1 tablet (10 mg total) by mouth daily. 30 tablet 0   fluticasone (FLONASE) 50 MCG/ACT nasal spray Place 2 sprays into both nostrils daily as needed for allergies or rhinitis. (Patient taking differently: Place 2 sprays into both nostrils daily.) 9.9 mL 0   furosemide (LASIX) 40 MG tablet Take 40 mg by mouth daily.     glipiZIDE (GLUCOTROL XL) 2.5 MG 24 hr tablet Take 2.5 mg by mouth daily with breakfast.     lidocaine 4 % Place 1 patch onto the skin daily. Apply 1 patch to mid lower back every day for 12 hours on and 12 hours off.     LORazepam (ATIVAN) 0.5 MG tablet Take 0.25 mg by mouth 2 (two) times daily.     Melatonin 10 MG TABS Take 10 mg by mouth at bedtime.     methocarbamol (ROBAXIN) 500 MG tablet Take 500 mg by mouth at bedtime.     mirtazapine (REMERON) 15 MG tablet Take 15 mg by mouth at bedtime.     Multiple Vitamin (MULTIVITAMIN WITH MINERALS) TABS tablet Take 1 tablet by mouth daily.     Omeprazole-Sodium Bicarbonate (ZEGERID OTC) 20-1100 MG CAPS  capsule Take 1 capsule by mouth daily before breakfast. 30 capsule 0   ondansetron (ZOFRAN) 4 MG tablet Take 4 mg by mouth every 8 (eight) hours as needed for nausea or vomiting.     polyethylene glycol powder (GLYCOLAX/MIRALAX) 17 GM/SCOOP powder Take 17 g by mouth daily as needed for mild constipation or moderate constipation. 255 g 0   simvastatin (ZOCOR) 20 MG tablet Take 1 tablet (20 mg total) by mouth daily at 6 PM. 30 tablet 0   sodium chloride (MURO 128) 5 % ophthalmic solution Place 1 drop into both eyes 2 (two) times daily.     tamsulosin (FLOMAX) 0.4 MG CAPS capsule Take 0.4 mg by mouth daily.     vitamin B-12 (CYANOCOBALAMIN) 100 MCG tablet Take 500 mcg by mouth daily.     zinc oxide 20 % ointment  Apply 1 Application topically daily as needed (Redness).     No current facility-administered medications for this visit.   Allergies:  Patient has no known allergies.   Social History: The patient  reports that he has been smoking cigarettes. He has never used smokeless tobacco. He reports that he does not currently use alcohol. He reports that he does not use drugs.   Family History: The patient's family history is not on file.   ROS:  Please see the history of present illness. Otherwise, complete review of systems is positive for none  All other systems are reviewed and negative.   Physical Exam: VS:  BP 110/62   Pulse 84   Ht 6\' 1"  (1.854 m)   Wt 206 lb 6.4 oz (93.6 kg)   SpO2 96%   BMI 27.23 kg/m , BMI Body mass index is 27.23 kg/m.  Wt Readings from Last 3 Encounters:  01/29/23 206 lb 6.4 oz (93.6 kg)  01/25/23 205 lb 0.4 oz (93 kg)  01/23/23 205 lb 0.4 oz (93 kg)    General: Patient appears comfortable at rest. HEENT: Conjunctiva and lids normal, oropharynx clear with moist mucosa. Neck: Supple, no elevated JVP or carotid bruits, no thyromegaly. Lungs: Clear to auscultation, nonlabored breathing at rest. Cardiac: Regular rate and rhythm, no S3 or significant systolic murmur, no pericardial rub. Abdomen: Soft, nontender, no hepatomegaly, bowel sounds present, no guarding or rebound. Extremities: 1-2+ pitting edema Skin: Warm and dry. Musculoskeletal: No kyphosis. Neuropsychiatric: Alert and oriented x3, affect grossly appropriate.  Recent Labwork: 01/23/2023: B Natriuretic Peptide 68.0 01/24/2023: ALT 11; AST 17; BUN 36; Creatinine, Ser 1.74; Hemoglobin 10.5; Platelets 181; Potassium 4.7; Sodium 137     Component Value Date/Time   TRIG 89 08/13/2019 1711    Assessment and Plan:  # Permanent atrial flutter with normal LVEF and no valvular heart disease # History of CVA -I reviewed all the EKGs on the EMR from 08/2022 that showed atrial flutter. Last known normal  sinus rhythm EKG was from 2021. There is another EKG from 07/2022 on care everywhere which reported normal sinus rhythm but I do not have the strips to review. Currently not on any rate controlling agents.  Asymptomatic, no palpitations. Denies any dizziness, lightheadedness or syncope.  He is wearing a heart monitor currently due to increased risk of falls but patient had falls due to poor muscle strength. Systemic anticoagulation is deferred due to increased risk of falls and recent head injury. Refused any invasive procedures.  # Bilateral lower EXTR swelling: Currently on p.o. Lasix 40 mg once daily and recommend compression socks.  Asymptomatic, no DOE, orthopnea or  PND.  Patient is also wheelchair-bound and does not exert himself.  # HLD: Continue simvastatin 20 mg nightly.   I have spent a total of 45 minutes with patient reviewing chart, EKGs, labs and examining patient as well as establishing an assessment and plan that was discussed with the patient.  > 50% of time was spent in direct patient care.    Medication Adjustments/Labs and Tests Ordered: Current medicines are reviewed at length with the patient today.  Concerns regarding medicines are outlined above.   Tests Ordered: No orders of the defined types were placed in this encounter.   Medication Changes: No orders of the defined types were placed in this encounter.   Disposition:  Follow up  1 year at [facility] or [facility]  Signed Arland Usery Verne Spurr, MD, 01/29/2023 12:05 PM    [facility] at [facility] [address] [place], [place], [place] [address]

## 2023-01-29 NOTE — Patient Instructions (Signed)
Medication Instructions:  Your physician has recommended you make the following change in your medication:  Stop taking Eliquis  Continue taking all other medications as prescribed   Labwork: None  Testing/Procedures: None  Follow-Up: Your physician recommends that you schedule a follow-up appointment in: 1 year You will receive a reminder call in the mail in about 10 months reminding you to call and schedule your appointment. If you don't receive this call, please contact our office.   Any Other Special Instructions Will Be Listed Below (If Applicable).  If you need a refill on your cardiac medications before your next appointment, please call your pharmacy.

## 2023-04-24 IMAGING — CT CT HEAD W/O CM
4 series · 16 of 47 positions shown, 18 images · non-contrast
Comparison: 04/03/2021.

CLINICAL DATA: Abnormal CT of brain [facility] (892-G4-CM)

EXAM:
CT HEAD WITHOUT CONTRAST
TECHNIQUE: Contiguous axial images were obtained from the base of the skull
through the vertex without intravenous contrast.

[Series 2: head wo · axial · 0.47mm/px · z∈[+1105,+1225]mm · 7 of 33 slices shown, 9 images]
[im 5/33  brain]
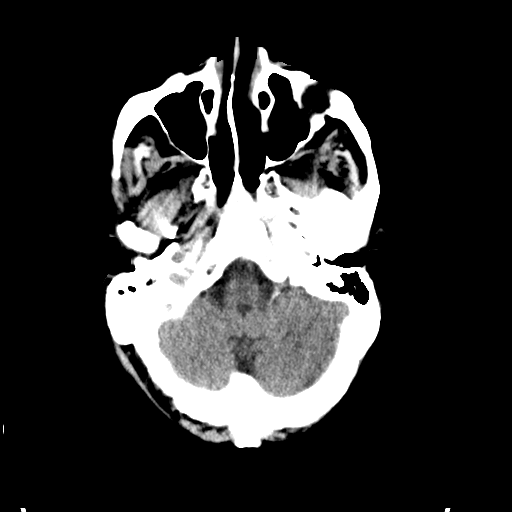
[im 5/33  bone]
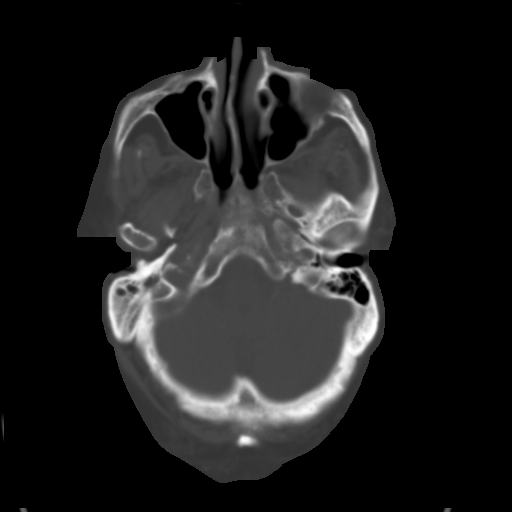
[im 9/33  brain]
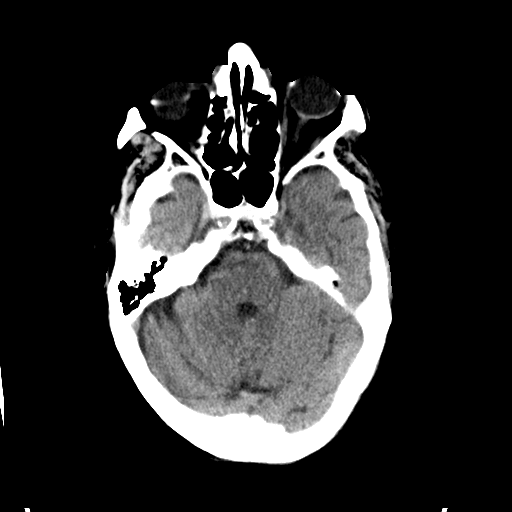
[im 13/33  brain]
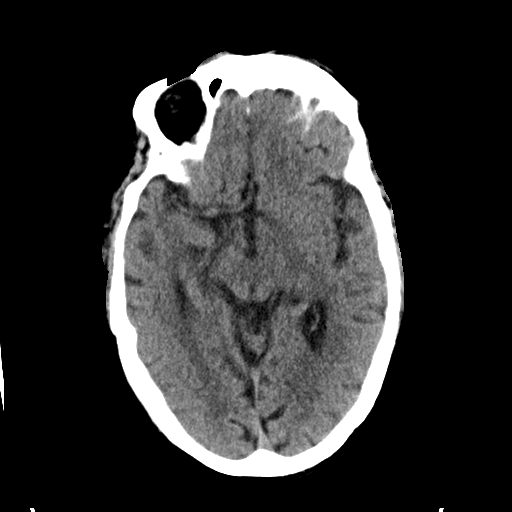
[im 17/33  brain]
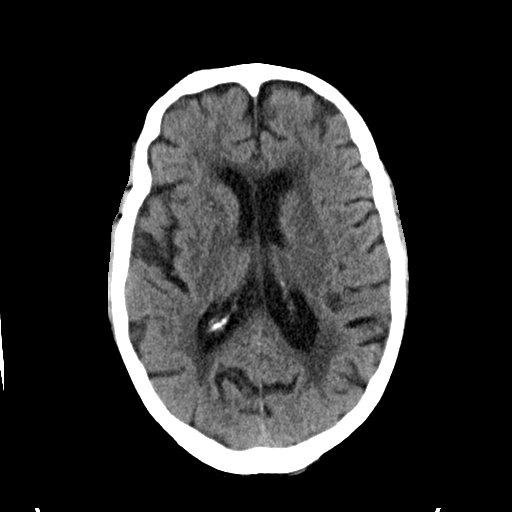
[im 21/33  brain]
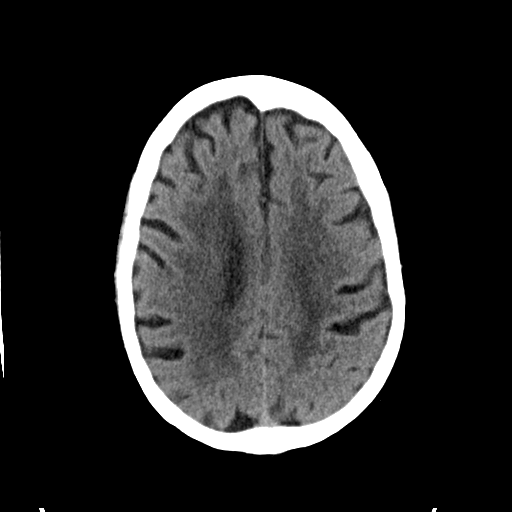
[im 21/33  bone]
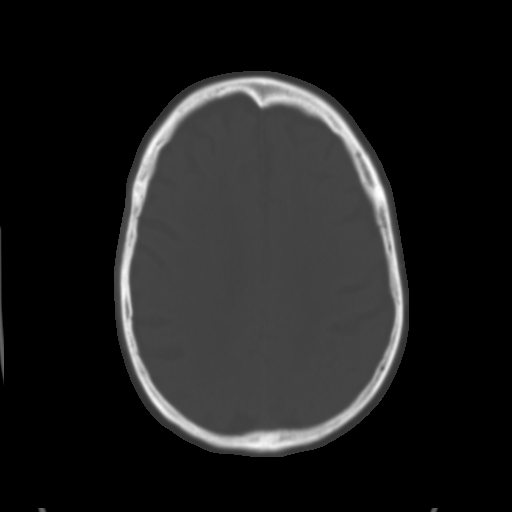
[im 25/33  brain]
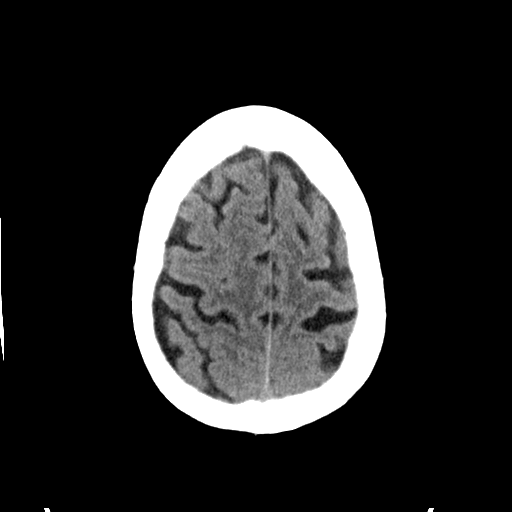
[im 29/33  brain]
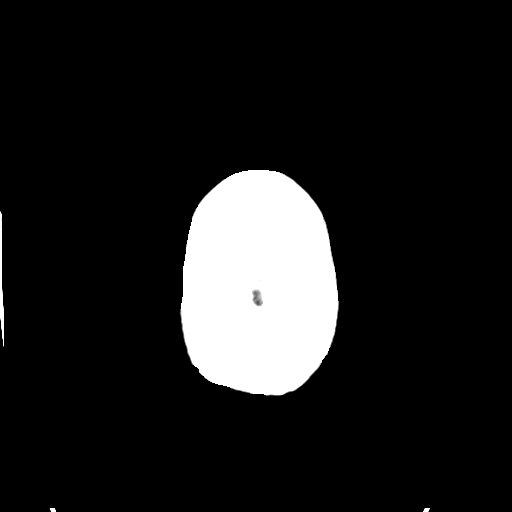

[Series 3: head bone · axial · 0.47mm/px · z∈[+1101,+1133]mm · 3 of 81 slices shown]
[im 9/81  bone]
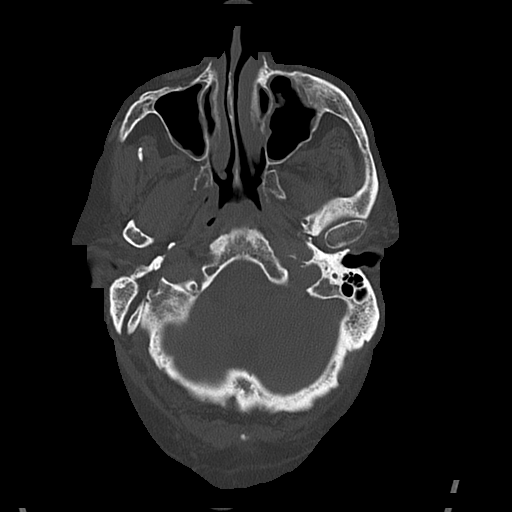
[im 17/81  bone]
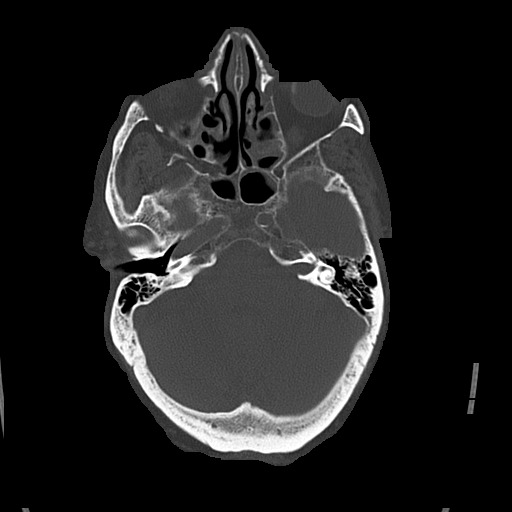
[im 25/81  bone]
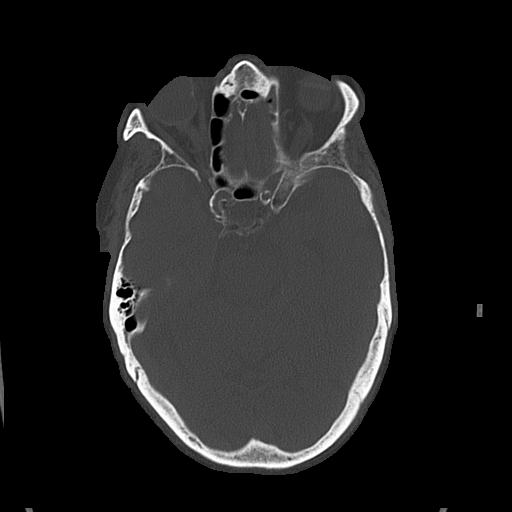

[Series 4: coronal soft · coronal · 0.32mm/px · 3 of 74 slices shown]
[im 25/74  brain]
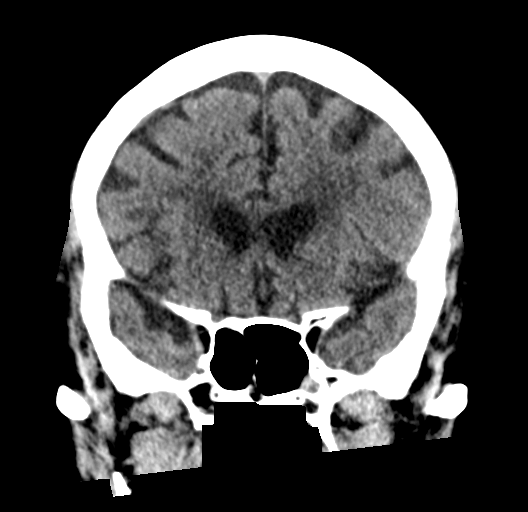
[im 33/74  brain]
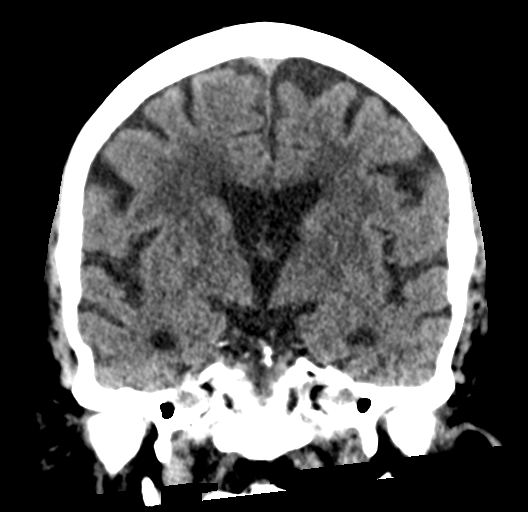
[im 41/74  brain]
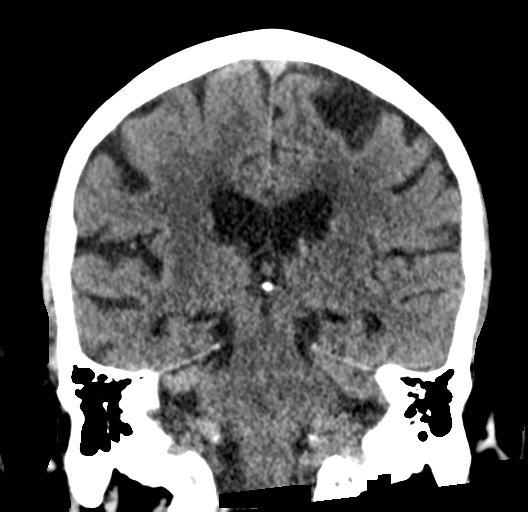

[Series 5: sagittal soft · sagittal · 0.32mm/px · 3 of 57 slices shown]
[im 19/57  brain]
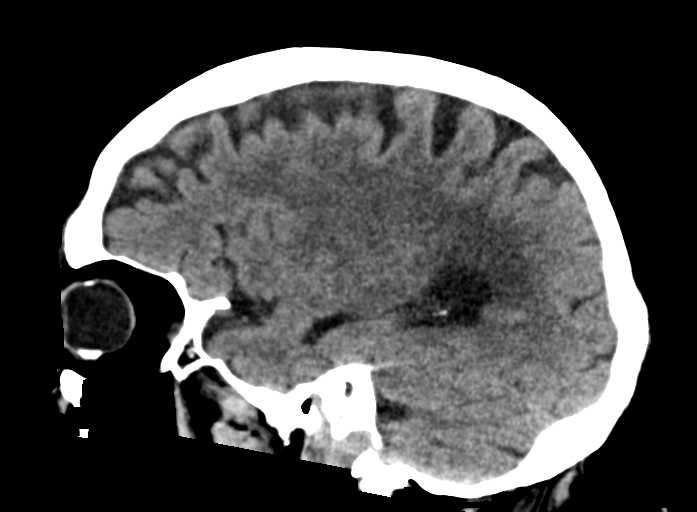
[im 29/57  brain]
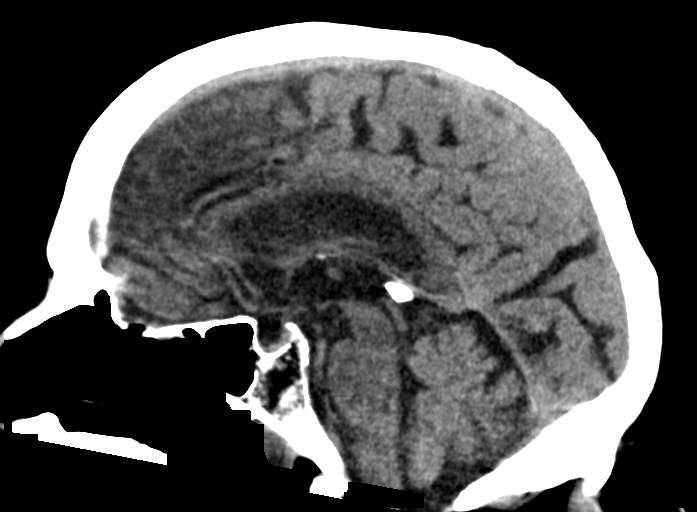
[im 38/57  brain]
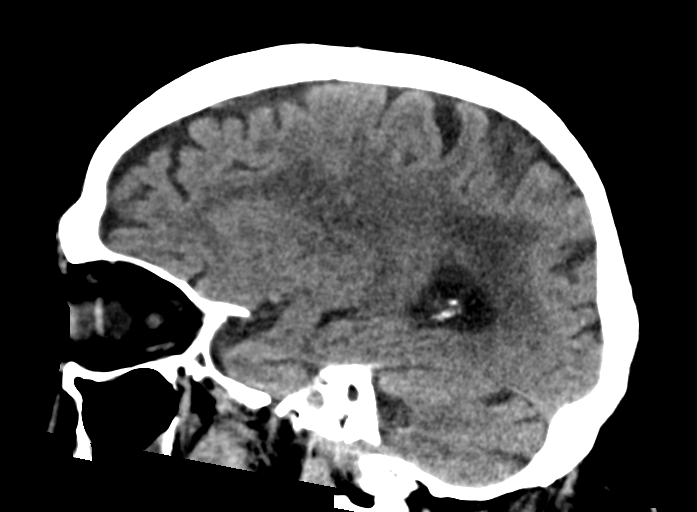

[16 of 47 positions shown; findings below may reference images not displayed]

FINDINGS: Brain: No evidence of acute large vascular territory infarct.
Moderate to advanced patchy white matter and deep gray
hypoattenuation, nonspecific but compatible with chronic
microvascular ischemic disease. No acute hemorrhage, mass lesion,
midline shift, or hydrocephalus. Mild atrophy with ex vacuo
ventricular dilation.

Vascular: No hyperdense vessel identified. Intracranial
atherosclerosis.

Skull: No acute fracture.

Sinuses/Orbits: Mild-to-moderate paranasal sinus mucosal thickening.
Similar postsurgical changes of the globes bilaterally.

Other: No mastoid effusions.
IMPRESSION: 1. No evidence of acute intracranial abnormality.
2. Similar moderate to advanced chronic microvascular ischemic
disease.

## 2023-07-22 ENCOUNTER — Ambulatory Visit (INDEPENDENT_AMBULATORY_CARE_PROVIDER_SITE_OTHER): Payer: Medicare Other | Admitting: Diagnostic Neuroimaging

## 2023-07-22 ENCOUNTER — Encounter: Payer: Self-pay | Admitting: Diagnostic Neuroimaging

## 2023-07-22 VITALS — BP 130/7 | HR 79 | Ht 73.0 in | Wt 219.0 lb

## 2023-07-22 DIAGNOSIS — R269 Unspecified abnormalities of gait and mobility: Secondary | ICD-10-CM

## 2023-07-22 NOTE — Progress Notes (Signed)
[facility]  PATIENT: Casey Cooley DOB: Jan 26, 1933  REFERRING CLINICIAN: Paulino Rily, FNP HISTORY FROM: patient  REASON FOR VISIT: new consult   HISTORICAL  CHIEF COMPLAINT:  Chief Complaint  Patient presents with   New Patient (Initial Visit)    Patient in room #7 with his caregiver. Patient states he has some falls, dropping things and difficulty getting up. Patient caregiver states patient fell on Monday.    HISTORY OF PRESENT ILLNESS:   89 year old male here for evaluation of physical decline, gait balance difficulty, dropping objects, urinary incontinence.  Symptoms have been progressive for at least 4 to 5 years.  He has been in a nursing home around the same timeframe.  He has been using wheelchair since about 2020.  Denies any neck or low back pain.  Denies any numbness or tingling.    REVIEW OF SYSTEMS: Full 14 system review of systems performed and negative with exception of: as per HPI.  ALLERGIES: No Known Allergies  HOME MEDICATIONS: Outpatient Medications Prior to Visit  Medication Sig Dispense Refill   acetaminophen (TYLENOL) 325 MG tablet Take 650 mg by mouth 2 (two) times daily.     budesonide-formoterol (SYMBICORT) 160-4.5 MCG/ACT inhaler Inhale 2 puffs into the lungs 2 (two) times daily.     calcium carbonate (OSCAL) 1500 (600 Ca) MG TABS tablet Take 600 mg of elemental calcium by mouth daily.     cephALEXin (KEFLEX) 500 MG capsule Take 1 capsule (500 mg total) by mouth every 8 (eight) hours. 18 capsule 0   cholecalciferol (VITAMIN D3) 25 MCG (1000 UT) tablet Take 2,000 Units by mouth daily.     docusate sodium (COLACE) 100 MG capsule Take 100 mg by mouth daily.     ELIQUIS 5 MG TABS tablet Take 5 mg by mouth 2 (two) times daily.     escitalopram (LEXAPRO) 10 MG tablet Take 1 tablet (10 mg total) by mouth daily. 30 tablet 0   fluticasone (FLONASE) 50 MCG/ACT nasal spray Place 2 sprays into both nostrils daily as needed for  allergies or rhinitis. (Patient taking differently: Place 2 sprays into both nostrils daily.) 9.9 mL 0   furosemide (LASIX) 40 MG tablet Take 40 mg by mouth daily.     glipiZIDE (GLUCOTROL XL) 2.5 MG 24 hr tablet Take 2.5 mg by mouth daily with breakfast.     lidocaine 4 % Place 1 patch onto the skin daily. Apply 1 patch to mid lower back every day for 12 hours on and 12 hours off.     LORazepam (ATIVAN) 0.5 MG tablet Take 0.25 mg by mouth 2 (two) times daily.     losartan (COZAAR) 50 MG tablet Take 50 mg by mouth daily.     Melatonin 10 MG TABS Take 10 mg by mouth at bedtime.     methocarbamol (ROBAXIN) 500 MG tablet Take 500 mg by mouth at bedtime.     mirtazapine (REMERON) 15 MG tablet Take 15 mg by mouth at bedtime.     Multiple Vitamin (MULTIVITAMIN WITH MINERALS) TABS tablet Take 1 tablet by mouth daily.     nystatin ointment (MYCOSTATIN) Apply 1 Application topically 2 (two) times daily.     Omeprazole-Sodium Bicarbonate (ZEGERID OTC) 20-1100 MG CAPS capsule Take 1 capsule by mouth daily before breakfast. 30 capsule 0   ondansetron (ZOFRAN) 4 MG tablet Take 4 mg by mouth every 8 (eight) hours as needed for nausea or vomiting.     polyethylene glycol powder (GLYCOLAX/MIRALAX)  17 GM/SCOOP powder Take 17 g by mouth daily as needed for mild constipation or moderate constipation. 255 g 0   simvastatin (ZOCOR) 20 MG tablet Take 1 tablet (20 mg total) by mouth daily at 6 PM. 30 tablet 0   sodium chloride (MURO 128) 5 % ophthalmic solution Place 1 drop into both eyes 2 (two) times daily.     tamsulosin (FLOMAX) 0.4 MG CAPS capsule Take 0.4 mg by mouth daily.     vitamin B-12 (CYANOCOBALAMIN) 100 MCG tablet Take 500 mcg by mouth daily.     zinc oxide 20 % ointment Apply 1 Application topically daily as needed (Redness).     No facility-administered medications prior to visit.    PAST MEDICAL HISTORY: Past Medical History:  Diagnosis Date   BPH (benign prostatic hyperplasia)    per [facility]   CKD  (chronic kidney disease)    COPD (chronic obstructive pulmonary disease) (HCC)    Depression    Diabetes mellitus without complication (HCC)    GERD (gastroesophageal reflux disease)    Hypercholesterolemia    Hyperlipidemia    Hypertension    RETINAL DETACHMENT, BILATERAL, HX OF 05/31/2009   Qualifier: Diagnosis of  By: Vernie Murders      PAST SURGICAL HISTORY: Past Surgical History:  Procedure Laterality Date   EYE SURGERY     FOOT SURGERY      FAMILY HISTORY: History reviewed. No pertinent family history.  SOCIAL HISTORY: Social History   Socioeconomic History   Marital status: Widowed    Spouse name: Not on file   Number of children: Not on file   Years of education: Not on file   Highest education level: Not on file  Occupational History   Not on file  Tobacco Use   Smoking status: Some Days    Types: Cigarettes   Smokeless tobacco: Never  Vaping Use   Vaping status: Never Used  Substance and Sexual Activity   Alcohol use: Not Currently    Comment: occ   Drug use: Never   Sexual activity: Not on file  Other Topics Concern   Not on file  Social History Narrative   Not on file   Social Determinants of Health   Financial Resource Strain: Low Risk  (12/12/2019)   Received from [facility], [facility]   Overall Financial Resource Strain (CARDIA)    Difficulty of Paying Living Expenses: Not hard at all  Food Insecurity: No Food Insecurity (12/12/2019)   Received from [facility], [facility]   Hunger Vital Sign    Worried About Running Out of Food in the Last Year: Never true    Ran Out of Food in the Last Year: Never true  Transportation Needs: Unmet Transportation Needs (12/12/2019)   Received from [employer], [facility]   PRAPARE - Transportation    Lack of Transportation (Medical): Yes    Lack of Transportation (Non-Medical): No  Physical Activity: Inactive (12/12/2019)   Received from [facility], [facility]   Exercise Vital Sign     Days of Exercise per Week: 0 days    Minutes of Exercise per Session: 0 min  Stress: Stress Concern Present (12/12/2019)   Received from [facility], [facility] - Occupational Stress Questionnaire    Feeling of Stress : Very much  Social Connections: Unknown (12/14/2021)   Received from [facility], [facility]   Social Network    Social Network: Not on file  Intimate Partner Violence: Unknown (11/14/2021)   Received from [facility], [facility]   HITS    Physically Hurt: Not on file    Insult or Talk Down To: Not on file    Threaten Physical Harm: Not on file    Scream or Curse: Not on file     PHYSICAL EXAM  GENERAL EXAM/CONSTITUTIONAL: Vitals:  Vitals:   07/22/23 1131  BP: (!) 130/7  Pulse: 79  Weight: 219 lb (99.3 kg)  Height: 6\' 1"  (1.854 m)   Body mass index is 28.89 kg/m. Wt Readings from Last 3 Encounters:  07/22/23 219 lb (99.3 kg)  01/29/23 206 lb 6.4 oz (93.6 kg)  01/25/23 205 lb 0.4 oz (93 kg)   Patient is in no distress; well developed, nourished and groomed; neck is supple  CARDIOVASCULAR: Examination of carotid arteries is normal; no carotid bruits Regular rate and rhythm, no murmurs Examination of peripheral vascular system by observation and palpation is normal  EYES: Ophthalmoscopic exam of optic discs and posterior segments is normal; no papilledema or hemorrhages No results found.  MUSCULOSKELETAL: Gait, strength, tone, movements noted in Neurologic exam below  NEUROLOGIC: MENTAL STATUS:      No data to display         awake, alert, oriented to person, place and time recent and remote memory intact normal attention and concentration language fluent, comprehension intact, naming intact fund of knowledge appropriate  CRANIAL NERVE:  2nd - no papilledema on fundoscopic exam 2nd, 3rd, 4th, 6th - pupils equal and reactive to light, visual fields full to confrontation, extraocular  muscles intact, no nystagmus 5th - facial sensation symmetric 7th - facial strength symmetric 8th - hearing intact 9th - palate elevates symmetrically, uvula midline 11th - shoulder shrug symmetric 12th - tongue protrusion midline HOARSE VOICE  MOTOR:  normal bulk and tone SLOW MOVEMENTS BUE 3 PROX, 4 DISTAL BLE 4 PROX; 2-3 DISTAL  SENSORY:  normal and symmetric to light touch, temperature, vibration; ABSENT IN ANKLES / FEET  COORDINATION:  finger-nose-finger, fine finger movements normal  REFLEXES:  deep tendon reflexes SLIGHTLY BRISK IN BUE; 1+ AT KNEES; TRACE AT ANKLES  GAIT/STATION:  IN WHEEL CHAIR     DIAGNOSTIC DATA (LABS, IMAGING, TESTING) - I reviewed patient records, labs, notes, testing and imaging myself where available.  Lab Results  Component Value Date   WBC 6.0 01/24/2023   HGB 10.5 (L) 01/24/2023   HCT 34.0 (L) 01/24/2023   MCV 102.1 (H) 01/24/2023   PLT 181 01/24/2023      Component Value Date/Time   NA 137 01/24/2023 0331   K 4.7 01/24/2023 0331   CL 105 01/24/2023 0331   CO2 25 01/24/2023 0331   GLUCOSE 98 01/24/2023 0331   BUN 36 (H) 01/24/2023 0331   CREATININE 1.74 (H) 01/24/2023 0331   CALCIUM 8.1 (L) 01/24/2023 0331   PROT 6.4 (L) 01/24/2023 0331   ALBUMIN 3.0 (L) 01/24/2023 0331   AST 17 01/24/2023 0331   ALT 11 01/24/2023 0331   ALKPHOS 82 01/24/2023 0331   BILITOT 0.4 01/24/2023 0331   GFRNONAA 37 (L) 01/24/2023 0331   GFRAA >60 08/20/2019 0549   Lab Results  Component Value Date   TRIG 89 08/13/2019   Lab Results  Component Value Date   HGBA1C 6.0 (H) 01/23/2023   No results found for: "VITAMINB12" No results found for: "TSH"  08/05/22 MRI brain  1. No acute finding. Moderate to severe chronic small-vessel  ischemic changes of  the pons and cerebral hemispheric white matter.  No change since last year.  2. Mucosal inflammatory changes of the paranasal sinuses.    ASSESSMENT AND PLAN  89 y.o. year old male here  with:   Dx:  1. Gait difficulty     PLAN:  GAIT DIFFICULTY, DROPPING THINGS, WEAKNESS IN FEET, INCONTINENCE, HYPERREFLEXIA IN UPPER EXT (since ~2020 / 2021) - check MRI cervical spine (rule out myelopathy) - continue PT exercises and supervision at SNF  Orders Placed This Encounter  Procedures   MR CERVICAL SPINE WO CONTRAST   Return for pending test results, pending if symptoms worsen or fail to improve.    Suanne Marker, MD 07/22/2023, 11:58 AM Certified in Neurology, Neurophysiology and Neuroimaging  [facility] [address] [place], [place] [address] [phone]

## 2023-07-27 ENCOUNTER — Telehealth: Payer: Self-pay | Admitting: Diagnostic Neuroimaging

## 2023-07-27 NOTE — Telephone Encounter (Signed)
[employer] medicare NPR sent to GI [phone] 

## 2023-07-28 NOTE — Telephone Encounter (Signed)
Per GI, he is in a wheelchair and needs to go to the hospital. I sent the order to [facility] to schedule. [phone]

## 2023-08-12 ENCOUNTER — Emergency Department (HOSPITAL_COMMUNITY): Payer: Medicare Other

## 2023-08-12 ENCOUNTER — Inpatient Hospital Stay (HOSPITAL_COMMUNITY)
Admission: EM | Admit: 2023-08-12 | Discharge: 2023-08-14 | DRG: 193 | Disposition: A | Discharge status: Skilled Nursing Facility | Payer: Medicare Other | Source: Skilled Nursing Facility | Attending: Family Medicine | Admitting: Family Medicine

## 2023-08-12 ENCOUNTER — Encounter (HOSPITAL_COMMUNITY): Payer: Self-pay | Admitting: Emergency Medicine

## 2023-08-12 ENCOUNTER — Other Ambulatory Visit: Payer: Self-pay

## 2023-08-12 DIAGNOSIS — I4892 Unspecified atrial flutter: Secondary | ICD-10-CM | POA: Diagnosis present

## 2023-08-12 DIAGNOSIS — J440 Chronic obstructive pulmonary disease with acute lower respiratory infection: Secondary | ICD-10-CM | POA: Diagnosis present

## 2023-08-12 DIAGNOSIS — F0150 Vascular dementia without behavioral disturbance: Secondary | ICD-10-CM | POA: Diagnosis present

## 2023-08-12 DIAGNOSIS — R531 Weakness: Secondary | ICD-10-CM | POA: Diagnosis present

## 2023-08-12 DIAGNOSIS — Z716 Tobacco abuse counseling: Secondary | ICD-10-CM

## 2023-08-12 DIAGNOSIS — Z66 Do not resuscitate: Secondary | ICD-10-CM | POA: Diagnosis present

## 2023-08-12 DIAGNOSIS — J205 Acute bronchitis due to respiratory syncytial virus: Secondary | ICD-10-CM

## 2023-08-12 DIAGNOSIS — J159 Unspecified bacterial pneumonia: Secondary | ICD-10-CM | POA: Diagnosis present

## 2023-08-12 DIAGNOSIS — J441 Chronic obstructive pulmonary disease with (acute) exacerbation: Secondary | ICD-10-CM | POA: Diagnosis present

## 2023-08-12 DIAGNOSIS — F17290 Nicotine dependence, other tobacco product, uncomplicated: Secondary | ICD-10-CM | POA: Diagnosis present

## 2023-08-12 DIAGNOSIS — N400 Enlarged prostate without lower urinary tract symptoms: Secondary | ICD-10-CM | POA: Diagnosis present

## 2023-08-12 DIAGNOSIS — E1122 Type 2 diabetes mellitus with diabetic chronic kidney disease: Secondary | ICD-10-CM | POA: Diagnosis present

## 2023-08-12 DIAGNOSIS — N1832 Chronic kidney disease, stage 3b: Secondary | ICD-10-CM | POA: Diagnosis present

## 2023-08-12 DIAGNOSIS — Z7901 Long term (current) use of anticoagulants: Secondary | ICD-10-CM

## 2023-08-12 DIAGNOSIS — Z1152 Encounter for screening for COVID-19: Secondary | ICD-10-CM | POA: Diagnosis not present

## 2023-08-12 DIAGNOSIS — G9341 Metabolic encephalopathy: Secondary | ICD-10-CM | POA: Diagnosis present

## 2023-08-12 DIAGNOSIS — I130 Hypertensive heart and chronic kidney disease with heart failure and stage 1 through stage 4 chronic kidney disease, or unspecified chronic kidney disease: Secondary | ICD-10-CM | POA: Diagnosis present

## 2023-08-12 DIAGNOSIS — F32A Depression, unspecified: Secondary | ICD-10-CM | POA: Diagnosis present

## 2023-08-12 DIAGNOSIS — J9602 Acute respiratory failure with hypercapnia: Secondary | ICD-10-CM | POA: Diagnosis present

## 2023-08-12 DIAGNOSIS — J9601 Acute respiratory failure with hypoxia: Secondary | ICD-10-CM | POA: Diagnosis present

## 2023-08-12 DIAGNOSIS — R5381 Other malaise: Secondary | ICD-10-CM | POA: Diagnosis present

## 2023-08-12 DIAGNOSIS — E8729 Other acidosis: Secondary | ICD-10-CM | POA: Diagnosis present

## 2023-08-12 DIAGNOSIS — K219 Gastro-esophageal reflux disease without esophagitis: Secondary | ICD-10-CM | POA: Diagnosis present

## 2023-08-12 DIAGNOSIS — J121 Respiratory syncytial virus pneumonia: Secondary | ICD-10-CM | POA: Diagnosis present

## 2023-08-12 DIAGNOSIS — J449 Chronic obstructive pulmonary disease, unspecified: Secondary | ICD-10-CM | POA: Diagnosis present

## 2023-08-12 DIAGNOSIS — E7800 Pure hypercholesterolemia, unspecified: Secondary | ICD-10-CM | POA: Diagnosis present

## 2023-08-12 DIAGNOSIS — Z79899 Other long term (current) drug therapy: Secondary | ICD-10-CM

## 2023-08-12 DIAGNOSIS — Z7951 Long term (current) use of inhaled steroids: Secondary | ICD-10-CM

## 2023-08-12 DIAGNOSIS — I5032 Chronic diastolic (congestive) heart failure: Secondary | ICD-10-CM | POA: Diagnosis present

## 2023-08-12 DIAGNOSIS — D7589 Other specified diseases of blood and blood-forming organs: Secondary | ICD-10-CM | POA: Diagnosis present

## 2023-08-12 DIAGNOSIS — M7989 Other specified soft tissue disorders: Secondary | ICD-10-CM | POA: Diagnosis present

## 2023-08-12 DIAGNOSIS — Z8673 Personal history of transient ischemic attack (TIA), and cerebral infarction without residual deficits: Secondary | ICD-10-CM

## 2023-08-12 DIAGNOSIS — Z7984 Long term (current) use of oral hypoglycemic drugs: Secondary | ICD-10-CM

## 2023-08-12 LAB — PROCALCITONIN: Procalcitonin: <0.1 ng/mL

## 2023-08-12 LAB — BLOOD GAS, VENOUS
Acid-Base Excess: 9.7 mmol/L — ABNORMAL HIGH (ref 0.0–2.0)
Bicarbonate: 36.9 mmol/L — ABNORMAL HIGH (ref 20.0–28.0)
Drawn by: 66297
FIO2: 21 %
O2 Saturation: 26.6 %
Patient temperature: 35.8
pCO2, Ven: 58 mm[Hg] (ref 44–60)
pH, Ven: 7.41 (ref 7.25–7.43)
pO2, Ven: <31 mm[Hg] — CL (ref 32–45)

## 2023-08-12 LAB — GLUCOSE, CAPILLARY
Glucose-Capillary: 92 mg/dL (ref 70–99)
Glucose-Capillary: 153 mg/dL — ABNORMAL HIGH (ref 70–99)

## 2023-08-12 LAB — CBC WITH DIFFERENTIAL/PLATELET
Abs Immature Granulocytes: 0.02 10*3/uL (ref 0.00–0.07)
Basophils Absolute: 0 10*3/uL (ref 0.0–0.1)
Basophils Relative: 1 %
Eosinophils Absolute: 0.4 10*3/uL (ref 0.0–0.5)
Eosinophils Relative: 7 %
HCT: 41.2 % (ref 39.0–52.0)
Hemoglobin: 12.7 g/dL — ABNORMAL LOW (ref 13.0–17.0)
Immature Granulocytes: 0 %
Lymphocytes Relative: 33 %
Lymphs Abs: 1.8 10*3/uL (ref 0.7–4.0)
MCH: 32.2 pg (ref 26.0–34.0)
MCHC: 30.8 g/dL (ref 30.0–36.0)
MCV: 104.6 fL — ABNORMAL HIGH (ref 80.0–100.0)
Monocytes Absolute: 1 10*3/uL (ref 0.1–1.0)
Monocytes Relative: 18 %
Neutro Abs: 2.3 10*3/uL (ref 1.7–7.7)
Neutrophils Relative %: 41 %
Platelets: 165 10*3/uL (ref 150–400)
RBC: 3.94 MIL/uL — ABNORMAL LOW (ref 4.22–5.81)
RDW: 14 % (ref 11.5–15.5)
WBC: 5.5 10*3/uL (ref 4.0–10.5)
nRBC: 0 % (ref 0.0–0.2)

## 2023-08-12 LAB — COMPREHENSIVE METABOLIC PANEL
ALT: 21 U/L (ref 0–44)
AST: 34 U/L (ref 15–41)
Albumin: 3.2 g/dL — ABNORMAL LOW (ref 3.5–5.0)
Alkaline Phosphatase: 93 U/L (ref 38–126)
Anion gap: 8 (ref 5–15)
BUN: 27 mg/dL — ABNORMAL HIGH (ref 8–23)
CO2: 28 mmol/L (ref 22–32)
Calcium: 8.7 mg/dL — ABNORMAL LOW (ref 8.9–10.3)
Chloride: 104 mmol/L (ref 98–111)
Creatinine, Ser: 1.7 mg/dL — ABNORMAL HIGH (ref 0.61–1.24)
GFR, Estimated: 38 mL/min — ABNORMAL LOW (ref >60)
Glucose, Bld: 67 mg/dL — ABNORMAL LOW (ref 70–99)
Potassium: 4.5 mmol/L (ref 3.5–5.1)
Sodium: 140 mmol/L (ref 135–145)
Total Bilirubin: 0.8 mg/dL (ref 0.0–1.2)
Total Protein: 7.7 g/dL (ref 6.5–8.1)

## 2023-08-12 LAB — RESP PANEL BY RT-PCR (RSV, FLU A&B, COVID)  RVPGX2
Influenza A by PCR: NEGATIVE
Influenza B by PCR: NEGATIVE
Resp Syncytial Virus by PCR: POSITIVE — AB
SARS Coronavirus 2 by RT PCR: NEGATIVE

## 2023-08-12 LAB — URINALYSIS, W/ REFLEX TO CULTURE (INFECTION SUSPECTED)
Bacteria, UA: NONE SEEN
Bilirubin Urine: NEGATIVE
Glucose, UA: NEGATIVE mg/dL
Hgb urine dipstick: NEGATIVE
Ketones, ur: NEGATIVE mg/dL
Nitrite: NEGATIVE
Protein, ur: NEGATIVE mg/dL
Specific Gravity, Urine: 1.012 (ref 1.005–1.030)
pH: 5 (ref 5.0–8.0)

## 2023-08-12 LAB — BLOOD GAS, ARTERIAL
Acid-Base Excess: 2.3 mmol/L — ABNORMAL HIGH (ref 0.0–2.0)
Bicarbonate: 29.4 mmol/L — ABNORMAL HIGH (ref 20.0–28.0)
Drawn by: 38235
FIO2: 32 %
O2 Saturation: 90.8 %
Patient temperature: 37
pCO2 arterial: 57 mmHg — ABNORMAL HIGH (ref 32–48)
pH, Arterial: 7.32 — ABNORMAL LOW (ref 7.35–7.45)
pO2, Arterial: 67 mmHg — ABNORMAL LOW (ref 83–108)

## 2023-08-12 LAB — MAGNESIUM: Magnesium: 2.2 mg/dL (ref 1.7–2.4)

## 2023-08-12 LAB — TROPONIN I (HIGH SENSITIVITY): Troponin I (High Sensitivity): 12 ng/L (ref <18)

## 2023-08-12 LAB — HIV ANTIBODY (ROUTINE TESTING W REFLEX): HIV Screen 4th Generation wRfx: NONREACTIVE

## 2023-08-12 LAB — BRAIN NATRIURETIC PEPTIDE: B Natriuretic Peptide: 82 pg/mL (ref 0.0–100.0)

## 2023-08-12 LAB — STREP PNEUMONIAE URINARY ANTIGEN: Strep Pneumo Urinary Antigen: NEGATIVE

## 2023-08-12 MED ORDER — ALBUTEROL SULFATE (2.5 MG/3ML) 0.083% IN NEBU: 2.5000 mg | INHALATION_SOLUTION | RESPIRATORY_TRACT | Status: DC | PRN

## 2023-08-12 MED ORDER — FUROSEMIDE 10 MG/ML IJ SOLN
40.0000 mg | INTRAMUSCULAR | Status: AC
  Administered 2023-08-12: 40 mg via INTRAVENOUS
  Filled 2023-08-12: qty 4

## 2023-08-12 MED ORDER — VITAMIN D 25 MCG (1000 UNIT) PO TABS
2000.0000 [IU] | ORAL_TABLET | Freq: Every day | ORAL | Status: DC
  Administered 2023-08-13 – 2023-08-14 (×2): 2000 [IU] via ORAL
  Filled 2023-08-12 (×2): qty 2

## 2023-08-12 MED ORDER — ADULT MULTIVITAMIN W/MINERALS CH
1.0000 | ORAL_TABLET | Freq: Every day | ORAL | Status: DC
  Administered 2023-08-13 – 2023-08-14 (×2): 1 via ORAL
  Filled 2023-08-12 (×2): qty 1

## 2023-08-12 MED ORDER — CALCIUM CARBONATE 1250 (500 CA) MG PO TABS
1.0000 | ORAL_TABLET | Freq: Every day | ORAL | Status: DC
  Administered 2023-08-13 – 2023-08-14 (×2): 1250 mg via ORAL
  Filled 2023-08-12 (×4): qty 1

## 2023-08-12 MED ORDER — ACETAMINOPHEN 650 MG RE SUPP: 650.0000 mg | Freq: Four times a day (QID) | RECTAL | Status: DC | PRN

## 2023-08-12 MED ORDER — HYDRALAZINE HCL 20 MG/ML IJ SOLN: 5.0000 mg | INTRAMUSCULAR | Status: DC | PRN

## 2023-08-12 MED ORDER — ONDANSETRON HCL 4 MG PO TABS: 4.0000 mg | ORAL_TABLET | Freq: Four times a day (QID) | ORAL | Status: DC | PRN

## 2023-08-12 MED ORDER — GUAIFENESIN ER 600 MG PO TB12
600.0000 mg | ORAL_TABLET | Freq: Two times a day (BID) | ORAL | Status: DC
  Administered 2023-08-13 – 2023-08-14 (×4): 600 mg via ORAL
  Filled 2023-08-12 (×5): qty 1

## 2023-08-12 MED ORDER — LORAZEPAM 0.5 MG PO TABS: 0.2500 mg | ORAL_TABLET | Freq: Two times a day (BID) | ORAL | Status: DC

## 2023-08-12 MED ORDER — ALBUTEROL SULFATE (2.5 MG/3ML) 0.083% IN NEBU
10.0000 mg | INHALATION_SOLUTION | Freq: Once | RESPIRATORY_TRACT | Status: AC
  Administered 2023-08-12: 10 mg via RESPIRATORY_TRACT
  Filled 2023-08-12: qty 12

## 2023-08-12 MED ORDER — CALCIUM CARBONATE 1500 (600 CA) MG PO TABS: 600.0000 mg | ORAL_TABLET | Freq: Every day | ORAL | Status: DC

## 2023-08-12 MED ORDER — SIMVASTATIN 20 MG PO TABS
20.0000 mg | ORAL_TABLET | Freq: Every day | ORAL | Status: DC
  Administered 2023-08-12 – 2023-08-14 (×3): 20 mg via ORAL
  Filled 2023-08-12 (×3): qty 1

## 2023-08-12 MED ORDER — ONDANSETRON HCL 4 MG/2ML IJ SOLN: 4.0000 mg | Freq: Four times a day (QID) | INTRAMUSCULAR | Status: DC | PRN

## 2023-08-12 MED ORDER — VITAMIN B-12 100 MCG PO TABS
500.0000 ug | ORAL_TABLET | Freq: Every day | ORAL | Status: DC
  Administered 2023-08-13 – 2023-08-14 (×2): 500 ug via ORAL
  Filled 2023-08-12 (×2): qty 5

## 2023-08-12 MED ORDER — ALBUTEROL SULFATE (2.5 MG/3ML) 0.083% IN NEBU
2.5000 mg | INHALATION_SOLUTION | Freq: Four times a day (QID) | RESPIRATORY_TRACT | Status: DC
Start: 2023-08-12 — End: 2023-08-12

## 2023-08-12 MED ORDER — IPRATROPIUM BROMIDE 0.02 % IN SOLN
0.5000 mg | Freq: Four times a day (QID) | RESPIRATORY_TRACT | Status: DC
Start: 2023-08-12 — End: 2023-08-12

## 2023-08-12 MED ORDER — IPRATROPIUM-ALBUTEROL 0.5-2.5 (3) MG/3ML IN SOLN
3.0000 mL | Freq: Four times a day (QID) | RESPIRATORY_TRACT | Status: DC
  Administered 2023-08-12 – 2023-08-13 (×4): 3 mL via RESPIRATORY_TRACT
  Filled 2023-08-12 (×4): qty 3

## 2023-08-12 MED ORDER — CHLORHEXIDINE GLUCONATE CLOTH 2 % EX PADS
6.0000 | MEDICATED_PAD | Freq: Every day | CUTANEOUS | Status: DC
  Administered 2023-08-13: 6 via TOPICAL

## 2023-08-12 MED ORDER — METHYLPREDNISOLONE SODIUM SUCC 125 MG IJ SOLR
125.0000 mg | Freq: Once | INTRAMUSCULAR | Status: AC
  Administered 2023-08-12: 125 mg via INTRAVENOUS
  Filled 2023-08-12: qty 2

## 2023-08-12 MED ORDER — MIRTAZAPINE 15 MG PO TABS
15.0000 mg | ORAL_TABLET | Freq: Every day | ORAL | Status: DC
  Administered 2023-08-13 – 2023-08-14 (×2): 15 mg via ORAL
  Filled 2023-08-12 (×3): qty 1

## 2023-08-12 MED ORDER — TAMSULOSIN HCL 0.4 MG PO CAPS
0.4000 mg | ORAL_CAPSULE | Freq: Every day | ORAL | Status: DC
  Administered 2023-08-12 – 2023-08-14 (×3): 0.4 mg via ORAL
  Filled 2023-08-12 (×3): qty 1

## 2023-08-12 MED ORDER — METHOCARBAMOL 500 MG PO TABS: 500.0000 mg | ORAL_TABLET | Freq: Every day | ORAL | Status: DC

## 2023-08-12 MED ORDER — INSULIN ASPART 100 UNIT/ML IJ SOLN
0.0000 [IU] | Freq: Three times a day (TID) | INTRAMUSCULAR | Status: DC
  Administered 2023-08-13: 2 [IU] via SUBCUTANEOUS
  Administered 2023-08-13: 3 [IU] via SUBCUTANEOUS
  Administered 2023-08-13 – 2023-08-14 (×3): 2 [IU] via SUBCUTANEOUS
  Administered 2023-08-14: 3 [IU] via SUBCUTANEOUS

## 2023-08-12 MED ORDER — ACETAMINOPHEN 325 MG PO TABS: 650.0000 mg | ORAL_TABLET | Freq: Four times a day (QID) | ORAL | Status: DC | PRN

## 2023-08-12 MED ORDER — INSULIN ASPART 100 UNIT/ML IJ SOLN: 0.0000 [IU] | Freq: Every day | INTRAMUSCULAR | Status: DC

## 2023-08-12 MED ORDER — POLYETHYLENE GLYCOL 3350 17 G PO PACK: 17.0000 g | PACK | Freq: Every day | ORAL | Status: DC | PRN

## 2023-08-12 MED ORDER — PANTOPRAZOLE SODIUM 40 MG PO TBEC
40.0000 mg | DELAYED_RELEASE_TABLET | Freq: Every day | ORAL | Status: DC
  Administered 2023-08-12 – 2023-08-14 (×3): 40 mg via ORAL
  Filled 2023-08-12 (×3): qty 1

## 2023-08-12 MED ORDER — METHYLPREDNISOLONE SODIUM SUCC 125 MG IJ SOLR
125.0000 mg | Freq: Every day | INTRAMUSCULAR | Status: DC
  Filled 2023-08-12: qty 2

## 2023-08-12 MED ORDER — APIXABAN 2.5 MG PO TABS
2.5000 mg | ORAL_TABLET | Freq: Two times a day (BID) | ORAL | Status: DC
  Administered 2023-08-13 – 2023-08-14 (×4): 2.5 mg via ORAL
  Filled 2023-08-12 (×5): qty 1

## 2023-08-12 MED ORDER — NICOTINE 21 MG/24HR TD PT24
21.0000 mg | MEDICATED_PATCH | Freq: Every day | TRANSDERMAL | Status: DC
  Administered 2023-08-13 – 2023-08-14 (×2): 21 mg via TRANSDERMAL
  Filled 2023-08-12 (×3): qty 1

## 2023-08-12 MED ORDER — SODIUM CHLORIDE 0.9 % IV SOLN
500.0000 mg | INTRAVENOUS | Status: DC
  Administered 2023-08-12 – 2023-08-13 (×2): 500 mg via INTRAVENOUS
  Filled 2023-08-12 (×2): qty 5

## 2023-08-12 MED ORDER — SODIUM CHLORIDE 0.9 % IV SOLN
2.0000 g | INTRAVENOUS | Status: DC
  Administered 2023-08-12 – 2023-08-13 (×2): 2 g via INTRAVENOUS
  Filled 2023-08-12 (×2): qty 20

## 2023-08-12 MED ORDER — MOMETASONE FURO-FORMOTEROL FUM 200-5 MCG/ACT IN AERO
2.0000 | INHALATION_SPRAY | Freq: Two times a day (BID) | RESPIRATORY_TRACT | Status: DC
  Administered 2023-08-12 – 2023-08-14 (×4): 2 via RESPIRATORY_TRACT
  Filled 2023-08-12 (×2): qty 8.8

## 2023-08-12 NOTE — Progress Notes (Signed)
 Rapid response called for patient's mental status. ABG obtained and taken to lab.

## 2023-08-12 NOTE — ED Triage Notes (Signed)
 Pt from [facility] with reports of weakness and cough x 2 days. Denies fevers.

## 2023-08-12 NOTE — Progress Notes (Signed)
 Attempted to give pt scheduled medication and pt lethargic and unable to stay awake. Pt unable to answer questions. Charge nurse called to room. Pt did not respond to sternal rub. Rapid response called and MD was made aware of situation. Pt now responsive to pain only.

## 2023-08-12 NOTE — ED Notes (Signed)
 CRITICAL VALUE STICKER  CRITICAL VALUE: pO2 < 31  MESSENGER (representative from lab): Chelsea  MD NOTIFIED: Dr. Hyacinth Meeker  TIME OF NOTIFICATION: 1435

## 2023-08-12 NOTE — ED Provider Notes (Signed)
  Physical Exam  BP 131/69   Pulse (!) 49   Resp 19   SpO2 96%   Physical Exam Vitals and nursing note reviewed.  HENT:     Head: Normocephalic and atraumatic.  Eyes:     Pupils: Pupils are equal, round, and reactive to light.  Cardiovascular:     Rate and Rhythm: Normal rate and regular rhythm.  Pulmonary:     Effort: Pulmonary effort is normal.     Breath sounds: Normal breath sounds.  Abdominal:     Palpations: Abdomen is soft.     Tenderness: There is no abdominal tenderness.  Skin:    General: Skin is warm and dry.  Neurological:     Mental Status: He is alert.  Psychiatric:        Mood and Affect: Mood normal.     Procedures  Procedures  ED Course / MDM   Clinical Course as of 08/12/23 1600  Wed Aug 12, 2023  1551 Chest x-ray shows patchy opacities on the left.  CT head unremarkable.  Patient is RSV positive which likely represents a source of his illness.  No leukocytosis.  Unlikely to be bacterial pneumonia or benefit from antibiotics at this time.  Has been treated with steroids Lasix  and albuterol .  Given significant weakness and new oxygen requirement he will need to come in for admission [MP]  1600 Discussed with admitting hospitalist accepts patient for admission for inpatient management of RSV bronchitis and generalized weakness [MP]    Clinical Course User Index [MP] Pamella Ozell LABOR, DO   Medical Decision Making I, Ozell Pamella DO, have assumed care of this patient from the previous provider pending remainder of workup, reevaluation and disposition  Amount and/or Complexity of Data Reviewed Labs: ordered. Radiology: ordered. ECG/medicine tests: ordered.  Risk Prescription drug management.   Final diagnosis RSV bronchitis Generalized weakness       Pamella Ozell LABOR, DO 08/12/23 1600

## 2023-08-12 NOTE — H&P (Addendum)
 [facility] H&P   Patient Demographics:    Casey Cooley, is a 89 y.o. male  MRN: [id number]   DOB - [birth year]  Admit Date - 08/12/2023  Outpatient Primary MD for the patient is Hague, Kerney SQUIBB, MD  Referring MD/NP/PA: Dr. Pamella   Patient coming from: Patient long-term resident at [facility]  Chief Complaint  Patient presents with   Weakness      HPI:    Casey Cooley  is a 89 y.o. male,  with a history of hypertension, CKD stage III, COPD, hyperlipidemia, diabetes mellitus type 2, GERD patient is long-term resident at [facility] for last 40 years, his main next of kin currently is his niece, he has no drain, and spouse passed away for couple years ago . -Baseline patient is ambulatory with a walker, communicative , some underlying dementia -Patient was sent by his facility secondary to progressive weakness, increased lethargy, poor oral intake, as well increasing cough, dyspnea, underlying history of COPD, but no sign requirement, patient was obtundent when he was evaluated by EMS, he cannot provide any reliable complaints given his dementia and encephalopathy. -In ED patient with significant wheezing and decreased air entry, his VBG showing pCO2 less than 31, he was started on prolonged nebulizer treatment, received IV steroids, as well received 1 dose of IV Lasix , chest x-ray significant for multifocal left lung opacity, labs significant for baseline creatinine at 1.7, CT head with no acute finding, significant for advanced vascular disease, BP within normal limit at 82, white blood cell count within normal limit at 5.5, MCV elevated at 104, [facility] hospitalist consulted to admit  Review of systems:    Review of system is not reliable in this patient given history of dementia and his encephalopathy   With Past History of the following :    Past Medical History:  Diagnosis Date    BPH (benign prostatic hyperplasia)    per [facility]   CKD (chronic kidney disease)    COPD (chronic obstructive pulmonary disease) (HCC)    Depression    Diabetes mellitus without complication (HCC)    GERD (gastroesophageal reflux disease)    Hypercholesterolemia    Hyperlipidemia    Hypertension    RETINAL DETACHMENT, BILATERAL, HX OF 05/31/2009   Qualifier: Diagnosis of  By: Winona Raring        Past Surgical History:  Procedure Laterality Date   EYE SURGERY     FOOT SURGERY        Social History:     Social History   Tobacco Use   Smoking status: Some Days    Types: Cigarettes   Smokeless tobacco: Never  Substance Use Topics   Alcohol use: Not Currently    Comment: occ       Family History :    History reviewed. No pertinent family history.   Home Medications:   Prior to Admission  medications   Medication Sig Start Date End Date Taking? Authorizing Provider  acetaminophen  (TYLENOL ) 325 MG tablet Take 650 mg by mouth 2 (two) times daily.    [provider]  budesonide-formoterol  (SYMBICORT) 160-4.5 MCG/ACT inhaler Inhale 2 puffs into the lungs 2 (two) times daily.    [provider]  calcium  carbonate (OSCAL) 1500 (600 Ca) MG TABS tablet Take 600 mg of elemental calcium  by mouth daily.    [provider]  cephALEXin  (KEFLEX ) 500 MG capsule Take 1 capsule (500 mg total) by mouth every 8 (eight) hours. 01/24/23   Casey Lenis, MD  cholecalciferol  (VITAMIN D3) 25 MCG (1000 UT) tablet Take 2,000 Units by mouth daily.    [provider]  docusate sodium  (COLACE) 100 MG capsule Take 100 mg by mouth daily.    [provider]  ELIQUIS  5 MG TABS tablet Take 5 mg by mouth 2 (two) times daily.    [provider]  escitalopram  (LEXAPRO ) 10 MG tablet Take 1 tablet (10 mg total) by mouth daily. 09/07/19   Casey Arlean BIRCH, MD  fluticasone  (FLONASE ) 50 MCG/ACT nasal spray Place 2 sprays into both nostrils daily as needed for  allergies or rhinitis. Patient taking differently: Place 2 sprays into both nostrils daily. 09/07/19   Casey Arlean BIRCH, MD  furosemide  (LASIX ) 40 MG tablet Take 40 mg by mouth daily.    [provider]  glipiZIDE (GLUCOTROL XL) 2.5 MG 24 hr tablet Take 2.5 mg by mouth daily with breakfast.    [provider]  lidocaine 4 % Place 1 patch onto the skin daily. Apply 1 patch to mid lower back every day for 12 hours on and 12 hours off.    [provider]  LORazepam  (ATIVAN ) 0.5 MG tablet Take 0.25 mg by mouth 2 (two) times daily.    [provider]  losartan  (COZAAR ) 50 MG tablet Take 50 mg by mouth daily. 09/07/19   [provider]  Melatonin 10 MG TABS Take 10 mg by mouth at bedtime.    [provider]  methocarbamol  (ROBAXIN ) 500 MG tablet Take 500 mg by mouth at bedtime.    [provider]  mirtazapine  (REMERON ) 15 MG tablet Take 15 mg by mouth at bedtime.    [provider]  Multiple Vitamin (MULTIVITAMIN WITH MINERALS) TABS tablet Take 1 tablet by mouth daily.    [provider]  nystatin ointment (MYCOSTATIN) Apply 1 Application topically 2 (two) times daily. 03/03/23   [provider]  Omeprazole -Sodium Bicarbonate  (ZEGERID  OTC) 20-1100 MG CAPS capsule Take 1 capsule by mouth daily before breakfast. 09/07/19   Casey Arlean BIRCH, MD  ondansetron  (ZOFRAN ) 4 MG tablet Take 4 mg by mouth every 8 (eight) hours as needed for nausea or vomiting.    [provider]  polyethylene glycol powder (GLYCOLAX /MIRALAX ) 17 GM/SCOOP powder Take 17 g by mouth daily as needed for mild constipation or moderate constipation. 09/07/19   Casey Arlean BIRCH, MD  simvastatin  (ZOCOR ) 20 MG tablet Take 1 tablet (20 mg total) by mouth daily at 6 PM. 09/07/19   Casey Arlean BIRCH, MD  sodium chloride  (MURO 128) 5 % ophthalmic solution Place 1 drop into both eyes 2 (two) times daily.    [provider]  tamsulosin  (FLOMAX )  0.4 MG CAPS capsule Take 0.4 mg by mouth daily.    [provider]  vitamin B-12 (CYANOCOBALAMIN ) 100 MCG tablet Take 500 mcg by mouth daily.    [provider]  zinc  oxide 20 % ointment Apply 1 Application topically daily as needed (Redness).    [provider]     Allergies:    No Known Allergies   Physical Exam:   Vitals  Blood pressure 131/69, pulse (!) 49, resp. rate 19, SpO2 96%.   1. General Frail, elderly male, ill-appearing  2.  Is somnolent, but wakes up, answering questions, does appear to be demented   3. No F.N deficits, ALL C.Nerves Intact, Strength 5/5 all 4 extremities, Sensation intact all 4 extremities, Plantars down going.  4. Ears and Eyes appear Normal, Conjunctivae clear, PERRLA.  Dry oral Mucosa.  Patient with green purulent secretions  5. Supple Neck, No JVD, No cervical lymphadenopathy appriciated, No Carotid Bruits.  6. Symmetrical Chest wall movement, diminished air entry bilaterally mainly at the bases, with scattered wheezing  7. RRR, No Gallops, Rubs or Murmurs, No Parasternal Heave.  Telemetry monitor showing bladder heart rate in the 60s, has +2 edema  8. Positive Bowel Sounds, Abdomen Soft, No tenderness, No organomegaly appriciated,No rebound -guarding or rigidity.  9.  No Cyanosis, Normal Skin Turgor, No Skin Rash or Bruise.  10. Good muscle tone,  joints appear normal , no effusions    Data Review:    CBC Recent Labs  Lab 08/12/23 1424  WBC 5.5  HGB 12.7*  HCT 41.2  PLT 165  MCV 104.6*  MCH 32.2  MCHC 30.8  RDW 14.0  LYMPHSABS 1.8  MONOABS 1.0  EOSABS 0.4  BASOSABS 0.0   ------------------------------------------------------------------------------------------------------------------  Chemistries  Recent Labs  Lab 08/12/23 1424  NA 140  K 4.5  CL 104  CO2 28  GLUCOSE 67*  BUN 27*  CREATININE 1.70*  CALCIUM  8.7*  MG 2.2  AST 34  ALT 21  ALKPHOS 93  BILITOT 0.8    ------------------------------------------------------------------------------------------------------------------ CrCl cannot be calculated (Unknown ideal weight.). ------------------------------------------------------------------------------------------------------------------ No results for input(s): TSH, T4TOTAL, T3FREE, THYROIDAB in the last 72 hours.  Invalid input(s): FREET3  Coagulation profile No results for input(s): INR, PROTIME in the last 168 hours. ------------------------------------------------------------------------------------------------------------------- No results for input(s): DDIMER in the last 72 hours. -------------------------------------------------------------------------------------------------------------------  Cardiac Enzymes No results for input(s): CKMB, TROPONINI, MYOGLOBIN in the last 168 hours.  Invalid input(s): CK ------------------------------------------------------------------------------------------------------------------    Component Value Date/Time   BNP 82.0 08/12/2023 1424     ---------------------------------------------------------------------------------------------------------------  Urinalysis    Component Value Date/Time   COLORURINE YELLOW 01/23/2023 1303   APPEARANCEUR CLEAR 01/23/2023 1303   LABSPEC 1.010 01/23/2023 1303   PHURINE 5.0 01/23/2023 1303   GLUCOSEU NEGATIVE 01/23/2023 1303   HGBUR NEGATIVE 01/23/2023 1303   BILIRUBINUR NEGATIVE 01/23/2023 1303   KETONESUR NEGATIVE 01/23/2023 1303   PROTEINUR NEGATIVE 01/23/2023 1303   NITRITE NEGATIVE 01/23/2023 1303   LEUKOCYTESUR NEGATIVE 01/23/2023 1303    ----------------------------------------------------------------------------------------------------------------   Imaging Results:    DG Chest Port 1 View Result Date: 08/12/2023 CLINICAL DATA:  Cough, shortness of breath. EXAM: PORTABLE CHEST 1 VIEW COMPARISON:  Chest radiograph  01/23/2023. FINDINGS: Low lung volumes accentuate the pulmonary vasculature and cardiomediastinal silhouette. Patchy opacities in the left mid and lower lung may represent aspiration or infection. No pleural effusion or pneumothorax. Visualized bones and upper abdomen are unremarkable. IMPRESSION: Patchy opacities in the left mid and lower lung may represent aspiration or infection. Electronically Signed   By: Ryan Chess M.D.   On: 08/12/2023 14:51   CT Head Wo Contrast Result Date: 08/12/2023 CLINICAL DATA:  Mental status change, unknown cause.  Weakness. EXAM: CT HEAD WITHOUT CONTRAST TECHNIQUE: Contiguous axial images were obtained from the base of the skull through the vertex without intravenous contrast. RADIATION DOSE REDUCTION: This exam was performed according to the departmental dose-optimization program which includes automated exposure control, adjustment of the mA and/or kV according to patient size and/or use of iterative reconstruction technique. COMPARISON:  Head CT 01/25/2023. FINDINGS: Brain: No acute hemorrhage. Stable background of severe chronic small-vessel disease. Cortical gray-white differentiation is otherwise preserved. Prominence of the ventricles and sulci within expected range for age. No hydrocephalus or extra-axial collection. No mass effect or midline shift. Vascular: No hyperdense vessel or unexpected calcification. Skull: No calvarial fracture or suspicious bone lesion. Skull base is unremarkable. Sinuses/Orbits: Mild paranasal sinus disease. Bilateral scleral buckles. Other: None. IMPRESSION: 1. No acute intracranial abnormality. 2. Stable background of severe chronic small-vessel disease. Electronically Signed   By: Ryan Chess M.D.   On: 08/12/2023 14:50    EKG:  Vent. rate 51 BPM PR interval * ms QRS duration 137 ms QT/QTcB 538/496 ms P-R-T axes * 44 72 Atrial flutter IVCD, consider atypical RBBB Nonspecific T abnormalities, lateral leads Minimal ST  elevation, inferior leads  Assessment & Plan:    Principal Problem:   RSV (respiratory syncytial virus pneumonia) Active Problems:   Physical deconditioning   GERD without esophagitis   COPD  ? GOLD 0 / mild AB     Atrial flutter (HCC)   Chronic kidney disease, stage 3b (HCC)   Leg swelling   RSV pneumonia likely bacterial upper infection as well COPD exacerbation Acute hypoxemic respiratory failure with ABG showing PaO2  less than 31 -Patient presents with increased work of breathing, significantly diminished air entry bilaterally, workup significant for RSV. -Continue with steroids -With scheduled DuoNebs and as needed albuterol  -Resume home Symbicort -Having significant productive phlegm, will start on IV Rocephin  and azithromycin  -Procalcitonin is pending -Will encourage use incentive spirometry and flutter valve -Started on Mucinex  -Continue with oxygen, wean as tolerated    A flutter -Diagnosed during recent hospitalization in June -Rate seems to be controlled, not on any AV blocking agents -With apixaban , but will adjust dosing to age and renal function, will decrease from 5 to 2.5 mg p.o. twice daily    HTN -Carry history of hypertension, but he does not appear to be on antihypertensive medications, actually does appear all his cardiac meds resumed last admission secondary to hypotension. -Blood pressures acceptable, will add as needed hydralazine   CKD 3b -baseline creatinine 1.6-1.9 -10 at 1.7, at baseline today -Avoid nephrotoxic medications   Diabetes mellitus, type II -He is on glipizide, will hold, will keep on as needed sliding scale, I do anticipate elevated readings in the setting of steroids.  Deconditioning -Patient appears awfully weak and deconditioned, he was ambulating with a walker just few days ago, this is most likely in the setting of acute illness, will consult PT, OT  GERD -Continue with PPI  Macrocytosis -Continue with B12  supplement  Chronic diastolic CHF Chronic lower extremity edema -Most recent echo June 2024 with a preserved EF -He is on Lasix , he has lower extremity edema, which appears to be chronic, but I will hold for now as an acute illness, but I will resume more medically stable  tobacco use -Still smoking cigar and pipe intermittently in the facility, I will keep on nicotine  patch  Acute metabolic encephalopathy -Patient appears to be somnolent, he is usually more interactive and conversant as discussed with the niece, but  by reviewing his CT head it does appear he does have some significant baseline vascular dementia, but he is more altered than baseline most likely in setting of acute illness due to RSV infection, pCO2 is elevated and VBG, but appears to be compensated, this is likely chronic at baseline, he is on low-dose Xanax, she is chronic, I will continue this dose given low dose, and chronic and fear of withdrawals.  DVT Prophylaxis Eliquis    AM Labs Ordered, also please review Full Orders  Family Communication: Admission, patients condition and plan of care including tests being ordered have been discussed with the patient and Niece by phone who indicate understanding and agree with the plan and Code Status.  Code Status DNR.  Confirmed by niece  Likely DC to  [facility]  Consults called: none   Admission status: inpatinet    Time spent in minutes : 70 minutes   Brayton Lye M.D on 08/12/2023 at 4:18 PM

## 2023-08-12 NOTE — ED Provider Notes (Signed)
 [place] EMERGENCY DEPARTMENT AT [facility] Provider Note   CSN: [id number] Arrival date & time: 08/12/23  1250     History  Chief Complaint  Patient presents with   Weakness    Casey Cooley is a 89 y.o. male.   Weakness  This patient is an 89 year old male coming from [facility] mania down, this is a assisted care facility.  The report is that the patient has been increasingly weak over the last 2 days, he usually is able to get up out of the bed by himself, he feeds himself, he has not been able to do that today, he has been obtunded, he was not hypoxic or tachycardic but had increasing coughing and shortness of breath and his appearance.  I have reviewed the paperwork that comes with the patient, it appears that he has peripheral edema for which she takes furosemide , I suspect he has atrial flutter or fibrillation for which she is on Eliquis , he is on glipizide for diabetes, simvastatin  for cholesterol, Flomax  and acid reflux medications.  There is no family here to corroborate any story, paramedics report that he was essentially obtunded and route to the hospital  The patient had been admitted to the hospital in June 2024 after having a syncopal event and QT prolongation, he is followed by cardiology and was last seen after that admission to the hospital, notes from the cardiology visit suggested the patient does have permanent atrial flutter which had been diagnosed about a year ago, he has a history of a stroke and hypertension.  The patient has been seen by neurology because of his history of physical decline and his gait imbalance, his last visit was approximately 3 weeks ago during which time he was seen by the neurologist in person, at that time the patient was awake alert and oriented completely.  His memory was completely intact and had normal attention and concentration.  Normal language, he had slowed movements but normal bulk and tone, his coordination was  normal by finger-nose-finger.  He was in a wheelchair at the time MRI of the cervical spine was requested to rule out a myelopathy.  This MRI was actually scheduled to occur on August 13, 2023    Home Medications Prior to Admission medications   Medication Sig Start Date End Date Taking? Authorizing Provider  acetaminophen  (TYLENOL ) 325 MG tablet Take 650 mg by mouth 2 (two) times daily.    [provider]  budesonide-formoterol  (SYMBICORT) 160-4.5 MCG/ACT inhaler Inhale 2 puffs into the lungs 2 (two) times daily.    [provider]  calcium  carbonate (OSCAL) 1500 (600 Ca) MG TABS tablet Take 600 mg of elemental calcium  by mouth daily.    [provider]  cephALEXin  (KEFLEX ) 500 MG capsule Take 1 capsule (500 mg total) by mouth every 8 (eight) hours. 01/24/23   Evonnie Lenis, MD  cholecalciferol  (VITAMIN D3) 25 MCG (1000 UT) tablet Take 2,000 Units by mouth daily.    [provider]  docusate sodium  (COLACE) 100 MG capsule Take 100 mg by mouth daily.    [provider]  ELIQUIS  5 MG TABS tablet Take 5 mg by mouth 2 (two) times daily.    [provider]  escitalopram  (LEXAPRO ) 10 MG tablet Take 1 tablet (10 mg total) by mouth daily. 09/07/19   Marsa Arlean BIRCH, MD  fluticasone  (FLONASE ) 50 MCG/ACT nasal spray Place 2 sprays into both nostrils daily as needed for allergies or rhinitis. Patient taking differently:  Place 2 sprays into both nostrils daily. 09/07/19   Marsa Arlean BIRCH, MD  furosemide  (LASIX ) 40 MG tablet Take 40 mg by mouth daily.    [provider]  glipiZIDE (GLUCOTROL XL) 2.5 MG 24 hr tablet Take 2.5 mg by mouth daily with breakfast.    [provider]  lidocaine 4 % Place 1 patch onto the skin daily. Apply 1 patch to mid lower back every day for 12 hours on and 12 hours off.    [provider]  LORazepam  (ATIVAN ) 0.5 MG tablet Take 0.25 mg by mouth 2 (two) times daily.    [provider]  losartan   (COZAAR ) 50 MG tablet Take 50 mg by mouth daily. 09/07/19   [provider]  Melatonin 10 MG TABS Take 10 mg by mouth at bedtime.    [provider]  methocarbamol  (ROBAXIN ) 500 MG tablet Take 500 mg by mouth at bedtime.    [provider]  mirtazapine  (REMERON ) 15 MG tablet Take 15 mg by mouth at bedtime.    [provider]  Multiple Vitamin (MULTIVITAMIN WITH MINERALS) TABS tablet Take 1 tablet by mouth daily.    [provider]  nystatin ointment (MYCOSTATIN) Apply 1 Application topically 2 (two) times daily. 03/03/23   [provider]  Omeprazole -Sodium Bicarbonate  (ZEGERID  OTC) 20-1100 MG CAPS capsule Take 1 capsule by mouth daily before breakfast. 09/07/19   Marsa Arlean BIRCH, MD  ondansetron  (ZOFRAN ) 4 MG tablet Take 4 mg by mouth every 8 (eight) hours as needed for nausea or vomiting.    [provider]  polyethylene glycol powder (GLYCOLAX /MIRALAX ) 17 GM/SCOOP powder Take 17 g by mouth daily as needed for mild constipation or moderate constipation. 09/07/19   Marsa Arlean BIRCH, MD  simvastatin  (ZOCOR ) 20 MG tablet Take 1 tablet (20 mg total) by mouth daily at 6 PM. 09/07/19   Marsa Arlean BIRCH, MD  sodium chloride  (MURO 128) 5 % ophthalmic solution Place 1 drop into both eyes 2 (two) times daily.    [provider]  tamsulosin  (FLOMAX ) 0.4 MG CAPS capsule Take 0.4 mg by mouth daily.    [provider]  vitamin B-12 (CYANOCOBALAMIN ) 100 MCG tablet Take 500 mcg by mouth daily.    [provider]  zinc  oxide 20 % ointment Apply 1 Application topically daily as needed (Redness).    [provider]      Allergies    Patient has no known allergies.    Review of Systems   Review of Systems  Unable to perform ROS: Mental status change  Neurological:  Positive for weakness.    Physical Exam Updated Vital Signs BP 131/69   Pulse (!) 49   Resp 19   SpO2 96%  Physical Exam Vitals and nursing  note reviewed.  Constitutional:      General: He is not in acute distress.    Appearance: He is well-developed.  HENT:     Head: Normocephalic and atraumatic.     Mouth/Throat:     Pharynx: No oropharyngeal exudate.  Eyes:     General: No scleral icterus.       Right eye: No discharge.        Left eye: No discharge.     Conjunctiva/sclera: Conjunctivae normal.     Pupils: Pupils are equal, round, and reactive to light.  Neck:     Thyroid : No thyromegaly.     Vascular: No JVD.  Cardiovascular:     Rate  and Rhythm: Normal rate and regular rhythm.     Heart sounds: Normal heart sounds. No murmur heard.    No friction rub. No gallop.  Pulmonary:     Effort: Pulmonary effort is normal. No respiratory distress.     Breath sounds: Wheezing present. No rales.     Comments: The patient does have a prolonged expiratory phase with diffuse wheezing, there is no rales, no tachypnea Abdominal:     General: Bowel sounds are normal. There is no distension.     Palpations: Abdomen is soft. There is no mass.     Tenderness: There is no abdominal tenderness.  Musculoskeletal:        General: No tenderness. Normal range of motion.     Cervical back: Normal range of motion and neck supple.     Right lower leg: Edema present.     Left lower leg: Edema present.     Comments: 2+ pitting edema to the bilateral lower extremities below the knees  Lymphadenopathy:     Cervical: No cervical adenopathy.  Skin:    General: Skin is warm and dry.     Findings: No erythema or rash.  Neurological:     Mental Status: He is alert.     Comments: The patient is semirecumbent in the bed, he is obtunded, he is arousable to loud voice or painful stimuli but immediately falls back asleep.  He does have a gag reflex, he is able to move both hands and both legs but it is minimal and he appears grossly generally weak.  He mumbles answers with unintelligible words  Psychiatric:        Behavior: Behavior normal.      ED Results / Procedures / Treatments   Labs (all labs ordered are listed, but only abnormal results are displayed) Labs Reviewed - No data to display  EKG None  Radiology No results found.  Procedures .Critical Care  Performed by: Cleotilde Rogue, MD Authorized by: Cleotilde Rogue, MD   Critical care provider statement:    Critical care time (minutes):  45   Critical care time was exclusive of:  Separately billable procedures and treating other patients and teaching time   Critical care was necessary to treat or prevent imminent or life-threatening deterioration of the following conditions:  Respiratory failure and CNS failure or compromise   Critical care was time spent personally by me on the following activities:  Development of treatment plan with patient or surrogate, discussions with consultants, evaluation of patient's response to treatment, examination of patient, obtaining history from patient or surrogate, review of old charts, re-evaluation of patient's condition, pulse oximetry, ordering and review of radiographic studies, ordering and review of laboratory studies and ordering and performing treatments and interventions   I assumed direction of critical care for this patient from another provider in my specialty: no   Comments:           Medications Ordered in ED Medications  methylPREDNISolone  sodium succinate (SOLU-MEDROL ) 125 mg/2 mL injection 125 mg (has no administration in time range)  albuterol  (PROVENTIL ,VENTOLIN ) solution continuous neb (has no administration in time range)  furosemide  (LASIX ) injection 40 mg (has no administration in time range)    ED Course/ Medical Decision Making/ A&P Clinical Course as of 08/13/23 1542  Wed Aug 12, 2023  1551 Chest x-ray shows patchy opacities on the left.  CT head unremarkable.  Patient is RSV positive which likely represents a source of his illness.  No leukocytosis.  Unlikely to be bacterial pneumonia or benefit  from antibiotics at this time.  Has been treated with steroids Lasix  and albuterol .  Given significant weakness and new oxygen requirement he will need to come in for admission [MP]  1600 Discussed with admitting hospitalist accepts patient for admission for inpatient management of RSV bronchitis and generalized weakness [MP]    Clinical Course User Index [MP] Pamella Ozell LABOR, DO                                 Medical Decision Making Amount and/or Complexity of Data Reviewed Labs: ordered. Radiology: ordered. ECG/medicine tests: ordered.  Risk Prescription drug management. Decision regarding hospitalization.    This patient presents to the ED for concern of altered mental status, this involves an extensive number of treatment options, and is a complaint that carries with it a high risk of complications and morbidity.  The differential diagnosis includes infection, sepsis, stroke, renal failure, hypercapnia   Co morbidities that complicate the patient evaluation  No obvious history of reactive airway disease, he does appear to have diabetes hypertension prior stroke and peripheral edema   Additional history obtained:  Additional history obtained from medical record including prior admissions to the hospital, last echocardiogram was performed January 23, 2023 and showed External records from outside source obtained and reviewed including ejection fraction of 60 to 65%, no other acute findings   Lab Tests:  I Ordered, and personally interpreted labs.  The pertinent results include: A myriad of findings, the patient has a positive test for RSV, the VBG shows that the patient has a normal pH but hypoxia is present, there is no leukocytosis, the patient has a creatinine which is baseline at 1.7, troponin is 12 BNP is 82 and a magnesium is 2.2 with a procalcitonin less than 0.1.   Imaging Studies ordered:  I ordered imaging studies including chest x-ray I independently visualized  and interpreted imaging which showed there appears to be infiltrates on the x-ray which are patchy in the mid and left lower lung zones, this could be consistent with aspiration, CT scan of the head was unremarkable I agree with the radiologist interpretation   Cardiac Monitoring: / EKG:  The patient was maintained on a cardiac monitor.  I personally viewed and interpreted the cardiac monitored which showed an underlying rhythm of: Sinus rhythm   Consultations Obtained:  I requested consultation with the oncoming emergency department physician Dr. Pamella who will follow-up results and disposition accordingly.  Based on the patient's dense mental status change he will definitely need to be admitted to a higher level of care.     Problem List / ED Course / Critical interventions / Medication management  This patient required multiple repeat examinations and cardiac monitoring, he has an exam that is concerning for a dense encephalopathy, I suspect it is related to underlying pulmonary infection and will most likely need to be admitted to a higher level of care.  He appears critically ill Social Determinants of Health:  Nursing home   Test / Admission - Considered:  Admit to higher level of care, likely ICU         Final Clinical Impression(s) / ED Diagnoses Final diagnoses:  None    Rx / DC Orders ED Discharge Orders     None         Cleotilde Rogue, MD 08/13/23 1547

## 2023-08-12 NOTE — Progress Notes (Signed)
 Rapid response was called as patient has been lethargic, he is responsive only to painful and loud stimuli, ABG showing mild CO2 retention at 57, given significant encephalopathy I will transfer to stepdown for BiPAP trial overnight. Huey Bienenstock MD

## 2023-08-13 ENCOUNTER — Ambulatory Visit (HOSPITAL_COMMUNITY): Payer: Medicare Other

## 2023-08-13 DIAGNOSIS — J121 Respiratory syncytial virus pneumonia: Secondary | ICD-10-CM

## 2023-08-13 DIAGNOSIS — K219 Gastro-esophageal reflux disease without esophagitis: Secondary | ICD-10-CM

## 2023-08-13 DIAGNOSIS — J449 Chronic obstructive pulmonary disease, unspecified: Secondary | ICD-10-CM

## 2023-08-13 DIAGNOSIS — I4892 Unspecified atrial flutter: Secondary | ICD-10-CM

## 2023-08-13 DIAGNOSIS — R5381 Other malaise: Secondary | ICD-10-CM

## 2023-08-13 LAB — BASIC METABOLIC PANEL
Anion gap: 11 (ref 5–15)
BUN: 34 mg/dL — ABNORMAL HIGH (ref 8–23)
CO2: 25 mmol/L (ref 22–32)
Calcium: 8.5 mg/dL — ABNORMAL LOW (ref 8.9–10.3)
Chloride: 104 mmol/L (ref 98–111)
Creatinine, Ser: 1.8 mg/dL — ABNORMAL HIGH (ref 0.61–1.24)
GFR, Estimated: 36 mL/min — ABNORMAL LOW (ref >60)
Glucose, Bld: 177 mg/dL — ABNORMAL HIGH (ref 70–99)
Potassium: 4.5 mmol/L (ref 3.5–5.1)
Sodium: 140 mmol/L (ref 135–145)

## 2023-08-13 LAB — GLUCOSE, CAPILLARY
Glucose-Capillary: 141 mg/dL — ABNORMAL HIGH (ref 70–99)
Glucose-Capillary: 173 mg/dL — ABNORMAL HIGH (ref 70–99)
Glucose-Capillary: 138 mg/dL — ABNORMAL HIGH (ref 70–99)
Glucose-Capillary: 158 mg/dL — ABNORMAL HIGH (ref 70–99)

## 2023-08-13 LAB — CBC
HCT: 35.7 % — ABNORMAL LOW (ref 39.0–52.0)
Hemoglobin: 11 g/dL — ABNORMAL LOW (ref 13.0–17.0)
MCH: 32.3 pg (ref 26.0–34.0)
MCHC: 30.8 g/dL (ref 30.0–36.0)
MCV: 104.7 fL — ABNORMAL HIGH (ref 80.0–100.0)
Platelets: 146 10*3/uL — ABNORMAL LOW (ref 150–400)
RBC: 3.41 MIL/uL — ABNORMAL LOW (ref 4.22–5.81)
RDW: 13.9 % (ref 11.5–15.5)
WBC: 4.8 10*3/uL (ref 4.0–10.5)
nRBC: 0 % (ref 0.0–0.2)

## 2023-08-13 LAB — AMMONIA: Ammonia: 24 umol/L (ref 9–35)

## 2023-08-13 LAB — MRSA NEXT GEN BY PCR, NASAL: MRSA by PCR Next Gen: NOT DETECTED

## 2023-08-13 MED ORDER — IPRATROPIUM-ALBUTEROL 0.5-2.5 (3) MG/3ML IN SOLN
3.0000 mL | Freq: Three times a day (TID) | RESPIRATORY_TRACT | Status: DC
  Administered 2023-08-14 (×3): 3 mL via RESPIRATORY_TRACT
  Filled 2023-08-13 (×3): qty 3

## 2023-08-13 MED ORDER — FUROSEMIDE 40 MG PO TABS
40.0000 mg | ORAL_TABLET | Freq: Every day | ORAL | Status: DC
  Administered 2023-08-14: 40 mg via ORAL
  Filled 2023-08-13: qty 1

## 2023-08-13 MED ORDER — FUROSEMIDE 40 MG PO TABS: 40.0000 mg | ORAL_TABLET | Freq: Every day | ORAL | Status: DC

## 2023-08-13 MED ORDER — METHYLPREDNISOLONE SODIUM SUCC 40 MG IJ SOLR
40.0000 mg | Freq: Two times a day (BID) | INTRAMUSCULAR | Status: DC
  Administered 2023-08-13 – 2023-08-14 (×3): 40 mg via INTRAVENOUS
  Filled 2023-08-13 (×2): qty 1

## 2023-08-13 NOTE — Plan of Care (Signed)
  Problem: Acute Rehab OT Goals (only OT should resolve) Goal: Pt. Will Perform Grooming Flowsheets (Taken 08/13/2023 0922) Pt Will Perform Grooming:  with modified independence  sitting Goal: Pt. Will Perform Upper Body Dressing Flowsheets (Taken 08/13/2023 0922) Pt Will Perform Upper Body Dressing:  with modified independence  sitting Goal: Pt. Will Transfer To Toilet Flowsheets (Taken 08/13/2023 [phone]) Pt Will Transfer to Toilet:  with supervision  stand pivot transfer  with contact guard assist Goal: Pt. Will Perform Toileting-Clothing Manipulation Flowsheets (Taken 08/13/2023 0922) Pt Will Perform Toileting - Clothing Manipulation and hygiene:  with contact guard assist  sitting/lateral leans  with min assist Goal: Pt/Caregiver Will Perform Home Exercise Program Flowsheets (Taken 08/13/2023 [phone]) Pt/caregiver will Perform Home Exercise Program:  Increased ROM  Increased strength  Both right and left upper extremity  With minimal assist  Jaythen Hamme OT, MOT

## 2023-08-13 NOTE — Evaluation (Signed)
 Physical Therapy Evaluation Patient Details Name: Casey Cooley MRN: [id number] DOB: 1932/08/23 Today's Date: 08/13/2023  History of Present Illness  Casey Cooley  is a 89 y.o. male,  with a history of hypertension, CKD stage III, COPD, hyperlipidemia, diabetes mellitus type 2, GERD patient is long-term resident at [facility] for last 40 years, his main next of kin currently is his niece, he has no drain, and spouse passed away for couple years ago .  -Baseline patient is ambulatory with a walker, communicative , some underlying dementia  -Patient was sent by his facility secondary to progressive weakness, increased lethargy, poor oral intake, as well increasing cough, dyspnea, underlying history of COPD, but no sign requirement, patient was obtundent when he was evaluated by EMS, he cannot provide any reliable complaints given his dementia and encephalopathy.  -In ED patient with significant wheezing and decreased air entry, his VBG showing pCO2 less than 31, he was started on prolonged nebulizer treatment, received IV steroids, as well received 1 dose of IV Lasix , chest x-ray significant for multifocal left lung opacity, labs significant for baseline creatinine at 1.7, CT head with no acute finding, significant for advanced vascular disease, BP within normal limit at 82, white blood cell count within normal limit at 5.5, MCV elevated at 104, [facility] hospitalist consulted to admit   Clinical Impression  Patient demonstrates slightly labored movement during supine to sitting, very unsteady on feet and limited to a couple of side steps before having to sit due to BLE weakness and unable to transfer to chair using RW.  Patient required Mod assist stand pivot using armrest of chair for transferring to chair.  Patient tolerated sitting up in chair after therapy. Patient will benefit from continued skilled physical therapy in hospital and recommended venue below to increase strength, balance, endurance for  safe ADLs and gait.          If plan is discharge home, recommend the following: A lot of help with bathing/dressing/bathroom;A lot of help with walking and/or transfers;Help with stairs or ramp for entrance;Assistance with cooking/housework   Can travel by private vehicle   No    Equipment Recommendations None recommended by PT  Recommendations for Other Services       Functional Status Assessment Patient has had a recent decline in their functional status and demonstrates the ability to make significant improvements in function in a reasonable and predictable amount of time.     Precautions / Restrictions Precautions Precautions: Fall Restrictions Weight Bearing Restrictions Per Provider Order: No      Mobility  Bed Mobility Overal bed mobility: Needs Assistance Bed Mobility: Supine to Sit     Supine to sit: Contact guard, HOB elevated     General bed mobility comments: increased time, labored movement    Transfers Overall transfer level: Needs assistance Equipment used: Rolling walker (2 wheels) Transfers: Sit to/from Stand, Bed to chair/wheelchair/BSC Sit to Stand: Mod assist Stand pivot transfers: Mod assist         General transfer comment: unable to maintain standing balance using RW due to BLE weakness, able to transfer to chair with Mod assist stand pivot using armrest of chair    Ambulation/Gait Ambulation/Gait assistance: Mod assist, Max assist Gait Distance (Feet): 2 Feet Assistive device: Rolling walker (2 wheels) Gait Pattern/deviations: Decreased step length - left, Decreased stance time - right, Decreased stride length, Knees buckling Gait velocity: slow     General Gait Details: limited to a few couple of side  steps before having to sit due to buckling of knees, BLE weakness  Stairs            Wheelchair Mobility     Tilt Bed    Modified Rankin (Stroke Patients Only)       Balance Overall balance assessment: Needs  assistance Sitting-balance support: Feet supported, No upper extremity supported Sitting balance-Leahy Scale: Good Sitting balance - Comments: seated at EOB   Standing balance support: Reliant on assistive device for balance, Bilateral upper extremity supported, During functional activity Standing balance-Leahy Scale: Poor Standing balance comment: using RW                             Pertinent Vitals/Pain Pain Assessment Pain Assessment: Faces Faces Pain Scale: No hurt    Home Living Family/patient expects to be discharged to:: Assisted living                 Home Equipment: Rolling Walker (2 wheels);Wheelchair - manual      Prior Function Prior Level of Function : Needs assist       Physical Assist : Mobility (physical);ADLs (physical) Mobility (physical): Transfers;Gait;Bed mobility ADLs (physical): Bathing;Dressing;Toileting;IADLs Mobility Comments: Pt reports transfer to w/c via squat pivot or anterior posterior transfer. Pt later reported he did ambulate some with RW. ADLs Comments: Pt reports assist for bathing, dressing, and toileting by ALF staff.     Extremity/Trunk Assessment   Upper Extremity Assessment Upper Extremity Assessment: Defer to OT evaluation    Lower Extremity Assessment Lower Extremity Assessment: Generalized weakness    Cervical / Trunk Assessment Cervical / Trunk Assessment: Kyphotic  Communication   Communication Communication: No apparent difficulties;Hearing impairment Cueing Techniques: Verbal cues;Tactile cues  Cognition Arousal: Alert Behavior During Therapy: WFL for tasks assessed/performed Overall Cognitive Status: Within Functional Limits for tasks assessed                                          General Comments      Exercises     Assessment/Plan    PT Assessment Patient needs continued PT services  PT Problem List Decreased strength;Decreased activity tolerance;Decreased  balance;Decreased mobility       PT Treatment Interventions DME instruction;Gait training;Functional mobility training;Therapeutic activities;Therapeutic exercise;Balance training;Patient/family education    PT Goals (Current goals can be found in the Care Plan section)  Acute Rehab PT Goals Patient Stated Goal: return home with ALF staff to assist PT Goal Formulation: With patient Time For Goal Achievement: 08/27/23 Potential to Achieve Goals: Good    Frequency Min 3X/week     Co-evaluation PT/OT/SLP Co-Evaluation/Treatment: Yes Reason for Co-Treatment: To address functional/ADL transfers PT goals addressed during session: Mobility/safety with mobility;Balance;Proper use of DME OT goals addressed during session: ADL's and self-care       AM-PAC PT 6 Clicks Mobility  Outcome Measure Help needed turning from your back to your side while in a flat bed without using bedrails?: A Little Help needed moving from lying on your back to sitting on the side of a flat bed without using bedrails?: A Little Help needed moving to and from a bed to a chair (including a wheelchair)?: A Lot Help needed standing up from a chair using your arms (e.g., wheelchair or bedside chair)?: A Lot Help needed to walk in hospital room?: A Lot Help needed climbing  3-5 steps with a railing? : Total 6 Click Score: 13    End of Session   Activity Tolerance: Patient tolerated treatment well;Patient limited by fatigue Patient left: in chair;with call bell/phone within reach Nurse Communication: Mobility status PT Visit Diagnosis: Unsteadiness on feet (R26.81);Other abnormalities of gait and mobility (R26.89);Muscle weakness (generalized) (M62.81)    Time: 0802-0827 PT Time Calculation (min) (ACUTE ONLY): 25 min   Charges:   PT Evaluation $PT Eval Moderate Complexity: 1 Mod PT Treatments $Therapeutic Activity: 23-37 mins PT General Charges $$ ACUTE PT VISIT: 1 Visit         12:04 PM,  08/13/23 Lynwood Music, MPT Physical Therapist with [facility] [phone] office [phone] mobile phone

## 2023-08-13 NOTE — Hospital Course (Signed)
 89 y.o. male with a history of hypertension, CKD stage III, COPD, hyperlipidemia, diabetes mellitus type 2, GERD patient is long-term resident at [facility] for last 40 years, his main next of kin currently is his niece, he has no drain, and spouse passed away for couple years ago.  At Baseline patient is ambulatory with a walker, communicative, with underlying dementia.  Patient was sent by his facility secondary to progressive weakness, increased lethargy, poor oral intake, as well increasing cough, dyspnea, underlying history of COPD, but no sign requirement, patient was obtundent when he was evaluated by EMS, he cannot provide any reliable complaints given his dementia and encephalopathy.  In ED patient with significant wheezing and decreased air entry, his VBG showing pCO2 less than 31, he was started on prolonged nebulizer treatment, received IV steroids, as well received 1 dose of IV Lasix , chest x-ray significant for multifocal left lung opacity, labs significant for baseline creatinine at 1.7, CT head with no acute finding, significant for advanced vascular disease, BP within normal limit at 82, white blood cell count within normal limit at 5.5, MCV elevated at 104, [facility] hospitalist consulted to admit

## 2023-08-13 NOTE — Progress Notes (Signed)
 Date and time results received: 08/13/23 1845  (use smartphrase .now to insert current time)  Test: Blood cultures - aerobic bottle Critical Value: gram positive cocci, gram positive rods  Name of Provider Notified: Dr. Vicci  Orders Received? Or Actions Taken?: Actions Taken: Dr. Vicci made aware, antibiotics ordered

## 2023-08-13 NOTE — Progress Notes (Signed)
 PROGRESS NOTE   Casey Cooley  FMW:[id number] DOB: 1932-10-15 DOA: 08/12/2023 PCP: [name] SQUIBB, MD   Chief Complaint  Patient presents with   Weakness   Level of care: Med-Surg  Brief Admission History:  89 y.o. male with a history of hypertension, CKD stage III, COPD, hyperlipidemia, diabetes mellitus type 2, GERD patient is long-term resident at [facility] for last 40 years, his main next of kin currently is his niece, he has no drain, and spouse passed away for couple years ago.  At Baseline patient is ambulatory with a walker, communicative, with underlying dementia.  Patient was sent by his facility secondary to progressive weakness, increased lethargy, poor oral intake, as well increasing cough, dyspnea, underlying history of COPD, but no sign requirement, patient was obtundent when he was evaluated by EMS, he cannot provide any reliable complaints given his dementia and encephalopathy.  In ED patient with significant wheezing and decreased air entry, his VBG showing pCO2 less than 31, he was started on prolonged nebulizer treatment, received IV steroids, as well received 1 dose of IV Lasix , chest x-ray significant for multifocal left lung opacity, labs significant for baseline creatinine at 1.7, CT head with no acute finding, significant for advanced vascular disease, BP within normal limit at 82, white blood cell count within normal limit at 5.5, MCV elevated at 104, [facility] hospitalist consulted to admit   Assessment and Plan:  RSV pneumonia with superimposed bacterial infection COPD exacerbation Acute hypoxemic respiratory failure with hypercarbia  -Patient presents with increased work of breathing, significantly diminished air entry bilaterally, workup significant for RSV. -rapid response called 1/1 and placed on bipap due to high pCO2 level -he is now back to nasal cannula -continue nightly bipap therapy for now -Continue with steroids - reduced dose to 40 mg solumedrol BID   -With scheduled DuoNebs and as needed albuterol  -Resume home Symbicort -Having significant productive phlegm, will start on IV Rocephin  and azithromycin  -encourage use incentive spirometry and flutter valve -Started on Mucinex  -Continue with oxygen, wean as tolerated     A flutter -Diagnosed during recent hospitalization in June -Rate seems to be controlled, not on any AV blocking agents -With apixaban , but will adjust dosing to age and renal function, will decrease from 5 to 2.5 mg p.o. twice daily    HTN -Carry history of hypertension, but he does not appear to be on antihypertensive medications, actually does appear all his cardiac meds resumed last admission secondary to hypotension. -Blood pressures acceptable, will add as needed hydralazine    CKD 3b -baseline creatinine 1.6-1.9 -10 at 1.7, at baseline today -Avoid nephrotoxic medications   Diabetes mellitus, type II -He is on glipizide, will hold, will keep on as needed sliding scale, I do anticipate elevated readings in the setting of steroids. -A1c pending    Deconditioning -Patient appears awfully weak and deconditioned, he was ambulating with a walker just few days ago, this is most likely in the setting of acute illness, will consult PT, OT   GERD -Continue with PPI   Macrocytosis -Continue with B12 supplement   Chronic diastolic CHF Chronic lower extremity edema -Most recent echo June 2024 with a preserved EF -He is on Lasix , he has lower extremity edema, which appears to be chronic, but I will hold for now as an acute illness, but I will resume more medically stable   tobacco use -Still smoking cigar and pipe intermittently in the facility, I will keep on nicotine  patch -counseled on tobacco cessation  at bedside 08/13/23    Acute metabolic encephalopathy -Patient appears to be somnolent, he is usually more interactive and conversant as discussed with the niece, but by reviewing his CT head it does appear he  does have some significant baseline vascular dementia, but he is more altered than baseline most likely in setting of acute illness due to RSV infection, pCO2 is elevated and VBG, but appears to be compensated, this is likely chronic at baseline, he is on low-dose Xanax, she is chronic, I will continue this dose given low dose, and chronic and fear of withdrawals.   DVT prophylaxis: apixaban   Code Status: DNR  Family Communication:  Disposition: anticipate return to [employer]:   Procedures:   Antimicrobials:  Azithromycin  1/1>> CTX 1/1>>   Subjective: Mentation and breathing improved with bipap therapy, now back on nasal cannula.   Objective: Vitals:   08/13/23 0900 08/13/23 1000 08/13/23 1100 08/13/23 1132  BP: 114/62 (!) 110/49 (!) 104/55   Pulse: 88 86 87   Resp: 17 17 13    Temp:    97.8 F (36.6 C)  TempSrc:    Oral  SpO2: 96% 99% 95%   Weight:      Height:        Intake/Output Summary (Last 24 hours) at 08/13/2023 1134 Last data filed at 08/13/2023 0845 Gross per 24 hour  Intake 760 ml  Output 1600 ml  Net -840 ml   Filed Weights   08/12/23 1655 08/13/23 0006  Weight: 97.1 kg 96.7 kg   Examination:  General exam: Appears calm and comfortable  Respiratory system: diffuse wheezing heard bilateral, mild increased work of breathing. Cardiovascular system: normal S1 & S2 heard. No JVD, murmurs, rubs, gallops or clicks. No pedal edema. Gastrointestinal system: Abdomen is nondistended, soft and nontender. No organomegaly or masses felt. Normal bowel sounds heard. Central nervous system: Alert and oriented. No focal neurological deficits. Extremities: Symmetric 5 x 5 power. Skin: No rashes, lesions or ulcers. Psychiatry: Judgement and insight appear normal. Mood & affect appropriate.   Data Reviewed: I have personally reviewed following labs and imaging studies  CBC: Recent Labs  Lab 08/12/23 1424 08/13/23 0450  WBC 5.5 4.8  NEUTROABS 2.3  --    HGB 12.7* 11.0*  HCT 41.2 35.7*  MCV 104.6* 104.7*  PLT 165 146*    Basic Metabolic Panel: Recent Labs  Lab 08/12/23 1424 08/13/23 0450  NA 140 140  K 4.5 4.5  CL 104 104  CO2 28 25  GLUCOSE 67* 177*  BUN 27* 34*  CREATININE 1.70* 1.80*  CALCIUM  8.7* 8.5*  MG 2.2  --     CBG: Recent Labs  Lab 08/12/23 1717 08/12/23 2205 08/13/23 0807  GLUCAP 92 153* 141*    Recent Results (from the past 240 hours)  Resp panel by RT-PCR (RSV, Flu A&B, Covid) Anterior Nasal Swab     Status: Abnormal   Collection Time: 08/12/23  2:25 PM   Specimen: Anterior Nasal Swab  Result Value Ref Range Status   SARS Coronavirus 2 by RT PCR NEGATIVE NEGATIVE Final    Comment: (NOTE) SARS-CoV-2 target nucleic acids are NOT DETECTED.  The SARS-CoV-2 RNA is generally detectable in upper respiratory specimens during the acute phase of infection. The lowest concentration of SARS-CoV-2 viral copies this assay can detect is 138 copies/mL. A negative result does not preclude SARS-Cov-2 infection and should not be used as the sole basis for treatment or other patient management decisions. A  negative result may occur with  improper specimen collection/handling, submission of specimen other than nasopharyngeal swab, presence of viral mutation(s) within the areas targeted by this assay, and inadequate number of viral copies(<138 copies/mL). A negative result must be combined with clinical observations, patient history, and epidemiological information. The expected result is Negative.  Fact Sheet for Patients:  bloggercourse.com  Fact Sheet for Healthcare Providers:  seriousbroker.it  This test is no t yet approved or cleared by the [place]  FDA and  has been authorized for detection and/or diagnosis of SARS-CoV-2 by FDA under an Emergency Use Authorization (EUA). This EUA will remain  in effect (meaning this test can be used) for the duration  of the COVID-19 declaration under Section 564(b)(1) of the Act, 21 U.S.C.section 360bbb-3(b)(1), unless the authorization is terminated  or revoked sooner.       Influenza A by PCR NEGATIVE NEGATIVE Final   Influenza B by PCR NEGATIVE NEGATIVE Final    Comment: (NOTE) The Xpert Xpress SARS-CoV-2/FLU/RSV plus assay is intended as an aid in the diagnosis of influenza from Nasopharyngeal swab specimens and should not be used as a sole basis for treatment. Nasal washings and aspirates are unacceptable for Xpert Xpress SARS-CoV-2/FLU/RSV testing.  Fact Sheet for Patients: bloggercourse.com  Fact Sheet for Healthcare Providers: seriousbroker.it  This test is not yet approved or cleared by the [place]  FDA and has been authorized for detection and/or diagnosis of SARS-CoV-2 by FDA under an Emergency Use Authorization (EUA). This EUA will remain in effect (meaning this test can be used) for the duration of the COVID-19 declaration under Section 564(b)(1) of the Act, 21 U.S.C. section 360bbb-3(b)(1), unless the authorization is terminated or revoked.     Resp Syncytial Virus by PCR POSITIVE (A) NEGATIVE Final    Comment: (NOTE) Fact Sheet for Patients: bloggercourse.com  Fact Sheet for Healthcare Providers: seriousbroker.it  This test is not yet approved or cleared by the [place]  FDA and has been authorized for detection and/or diagnosis of SARS-CoV-2 by FDA under an Emergency Use Authorization (EUA). This EUA will remain in effect (meaning this test can be used) for the duration of the COVID-19 declaration under Section 564(b)(1) of the Act, 21 U.S.C. section 360bbb-3(b)(1), unless the authorization is terminated or revoked.  Performed at [facility], [address], [place], [place] [address]   Culture, blood (routine x 2) Call MD if unable to obtain prior to  antibiotics being given     Status: None (Preliminary result)   Collection Time: 08/12/23  4:44 PM   Specimen: BLOOD  Result Value Ref Range Status   Specimen Description BLOOD BLOOD RIGHT FOREARM  Final   Special Requests   Final    BOTTLES DRAWN AEROBIC AND ANAEROBIC Blood Culture results may not be optimal due to an inadequate volume of blood received in culture bottles   Culture   Final    NO GROWTH < 24 HOURS Performed at [facility], [address] [place], [place] [address]    Report Status PENDING  Incomplete  Culture, blood (routine x 2) Call MD if unable to obtain prior to antibiotics being given     Status: None (Preliminary result)   Collection Time: 08/12/23  4:44 PM   Specimen: BLOOD  Result Value Ref Range Status   Specimen Description BLOOD RIGHT ANTECUBITAL  Final   Special Requests   Final    BOTTLES DRAWN AEROBIC AND ANAEROBIC Blood Culture results may not be optimal due to an inadequate  volume of blood received in culture bottles   Culture   Final    NO GROWTH < 24 HOURS Performed at [facility], [address], [place], [place] [address]    Report Status PENDING  Incomplete  MRSA Next Gen by PCR, Nasal     Status: None   Collection Time: 08/13/23 12:18 AM   Specimen: Nasal Mucosa; Nasal Swab  Result Value Ref Range Status   MRSA by PCR Next Gen NOT DETECTED NOT DETECTED Final    Comment: (NOTE) The GeneXpert MRSA Assay (FDA approved for NASAL specimens only), is one component of a comprehensive MRSA colonization surveillance program. It is not intended to diagnose MRSA infection nor to guide or monitor treatment for MRSA infections. Test performance is not FDA approved in patients less than 64 years old. Performed at [facility], [address], [place], [place] [address]      Radiology Studies: [facility] 1 View Result Date: 08/12/2023 CLINICAL DATA:  Cough, shortness of breath. EXAM: PORTABLE CHEST 1 VIEW COMPARISON:  Chest radiograph 01/23/2023.  FINDINGS: Low lung volumes accentuate the pulmonary vasculature and cardiomediastinal silhouette. Patchy opacities in the left mid and lower lung may represent aspiration or infection. No pleural effusion or pneumothorax. Visualized bones and upper abdomen are unremarkable. IMPRESSION: Patchy opacities in the left mid and lower lung may represent aspiration or infection. Electronically Signed   By: Ryan Chess M.D.   On: 08/12/2023 14:51   CT Head Wo Contrast Result Date: 08/12/2023 CLINICAL DATA:  Mental status change, unknown cause.  Weakness. EXAM: CT HEAD WITHOUT CONTRAST TECHNIQUE: Contiguous axial images were obtained from the base of the skull through the vertex without intravenous contrast. RADIATION DOSE REDUCTION: This exam was performed according to the departmental dose-optimization program which includes automated exposure control, adjustment of the mA and/or kV according to patient size and/or use of iterative reconstruction technique. COMPARISON:  Head CT 01/25/2023. FINDINGS: Brain: No acute hemorrhage. Stable background of severe chronic small-vessel disease. Cortical gray-white differentiation is otherwise preserved. Prominence of the ventricles and sulci within expected range for age. No hydrocephalus or extra-axial collection. No mass effect or midline shift. Vascular: No hyperdense vessel or unexpected calcification. Skull: No calvarial fracture or suspicious bone lesion. Skull base is unremarkable. Sinuses/Orbits: Mild paranasal sinus disease. Bilateral scleral buckles. Other: None. IMPRESSION: 1. No acute intracranial abnormality. 2. Stable background of severe chronic small-vessel disease. Electronically Signed   By: Ryan Chess M.D.   On: 08/12/2023 14:50    Scheduled Meds:  apixaban   2.5 mg Oral BID   calcium  carbonate  1 tablet Oral Q breakfast   Chlorhexidine  Gluconate Cloth  6 each Topical Q0600   cholecalciferol   2,000 Units Oral Daily   furosemide   40 mg Oral Daily    guaiFENesin   600 mg Oral BID   insulin  aspart  0-15 Units Subcutaneous TID WC   insulin  aspart  0-5 Units Subcutaneous QHS   ipratropium-albuterol   3 mL Nebulization Q6H   methylPREDNISolone  (SOLU-MEDROL ) injection  40 mg Intravenous Q12H   mirtazapine   15 mg Oral QHS   mometasone -formoterol   2 puff Inhalation BID   multivitamin with minerals  1 tablet Oral Daily   nicotine   21 mg Transdermal Daily   pantoprazole   40 mg Oral Daily   simvastatin   20 mg Oral q1800   tamsulosin   0.4 mg Oral Daily   vitamin B-12  500 mcg Oral Daily   Continuous Infusions:  azithromycin  Stopped (08/12/23 1847)  cefTRIAXone  (ROCEPHIN )  IV Stopped (08/12/23 1741)     LOS: 1 day   Critical Care Procedure Note Authorized and Performed by: KYM Louder MD  Total Critical Care time:  58 mins Due to a high probability of clinically significant, life threatening deterioration, the patient required my highest level of preparedness to intervene emergently and I personally spent this critical care time directly and personally managing the patient.  This critical care time included obtaining a history; examining the patient, pulse oximetry; ordering and review of studies; arranging urgent treatment with development of a management plan; evaluation of patient's response of treatment; frequent reassessment; and discussions with other providers.  This critical care time was performed to assess and manage the high probability of imminent and life threatening deterioration that could result in multi-organ failure.  It was exclusive of separately billable procedures and treating other patients and teaching time.    Afton Louder, MD How to contact the [facility] Attending or Consulting provider 7A - 7P or covering provider during after hours 7P -7A, for this patient?  Check the care team in [facility] and look for a) attending/consulting [facility] provider listed and b) the [facility] team listed Log into [email or web address] to find provider on call.   Locate the [facility] provider you are looking for under [facility] and page to a number that you can be directly reached. If you still have difficulty reaching the provider, please page the [facility] (Director on Call) for the Hospitalists listed on amion for assistance.  08/13/2023, 11:34 AM

## 2023-08-13 NOTE — Plan of Care (Signed)
  Problem: Acute Rehab PT Goals(only PT should resolve) Goal: Pt Will Go Supine/Side To Sit Outcome: Progressing Flowsheets (Taken 08/13/2023 1205) Pt will go Supine/Side to Sit:  with supervision  with modified independence Goal: Patient Will Transfer Sit To/From Stand Outcome: Progressing Flowsheets (Taken 08/13/2023 1205) Patient will transfer sit to/from stand:  with minimal assist  with moderate assist Goal: Pt Will Transfer Bed To Chair/Chair To Bed Outcome: Progressing Flowsheets (Taken 08/13/2023 1205) Pt will Transfer Bed to Chair/Chair to Bed:  with min assist  with mod assist Goal: Pt Will Ambulate Outcome: Progressing Flowsheets (Taken 08/13/2023 1205) Pt will Ambulate:  10 feet  with moderate assist  with rolling walker   12:05 PM, 08/13/23 Lynwood Music, MPT Physical Therapist with [facility] [phone] office [phone] mobile phone

## 2023-08-13 NOTE — Evaluation (Addendum)
 Occupational Therapy Evaluation Patient Details Name: Casey Cooley MRN: [id number] DOB: [birth year] Today's Date: 08/13/2023   History of Present Illness Casey Cooley  is a 89 y.o. male,  with a history of hypertension, CKD stage III, COPD, hyperlipidemia, diabetes mellitus type 2, GERD patient is long-term resident at [facility] for last 40 years, his main next of kin currently is his niece, he has no drain, and spouse passed away for couple years ago .  -Baseline patient is ambulatory with a walker, communicative , some underlying dementia  -Patient was sent by his facility secondary to progressive weakness, increased lethargy, poor oral intake, as well increasing cough, dyspnea, underlying history of COPD, but no sign requirement, patient was obtundent when he was evaluated by EMS, he cannot provide any reliable complaints given his dementia and encephalopathy.  -In ED patient with significant wheezing and decreased air entry, his VBG showing pCO2 less than 31, he was started on prolonged nebulizer treatment, received IV steroids, as well received 1 dose of IV Lasix , chest x-ray significant for multifocal left lung opacity, labs significant for baseline creatinine at 1.7, CT head with no acute finding, significant for advanced vascular disease, BP within normal limit at 82, white blood cell count within normal limit at 5.5, MCV elevated at 104, [facility] hospitalist consulted to admit (per MD)   Clinical Impression   Pt agreeable to OT and PT co-evaluation. Unsure of pt's baseline function. Pt reported not using RW but latera reported he did for ambulation. Pt reports assist for some ADL's at baseline. Today pt required CGA for bed mobility and mod A for transfer to the chair. B LE noted to buckle when standing more than a few seconds. B UE weak with limited A/ROM around 90 degrees. Pt was left in the chair with nursing notified. Pt will benefit from continued OT in the hospital and recommended venue  below to increase strength, balance, and endurance for safe ADL's.          If plan is discharge home, recommend the following: A lot of help with walking and/or transfers;A lot of help with bathing/dressing/bathroom;Assistance with cooking/housework;Assist for transportation;Help with stairs or ramp for entrance;Direct supervision/assist for medications management    Functional Status Assessment  Patient has had a recent decline in their functional status and demonstrates the ability to make significant improvements in function in a reasonable and predictable amount of time.  Equipment Recommendations  None recommended by OT           Precautions / Restrictions Precautions Precautions: Fall Restrictions Weight Bearing Restrictions Per Provider Order: No      Mobility Bed Mobility Overal bed mobility: Needs Assistance Bed Mobility: Supine to Sit     Supine to sit: Contact guard, HOB elevated     General bed mobility comments: slow labored movement; use of bed rail.    Transfers Overall transfer level: Needs assistance Equipment used: Rolling walker (2 wheels) Transfers: Sit to/from Stand, Bed to chair/wheelchair/BSC Sit to Stand: Mod assist Stand pivot transfers: Mod assist         General transfer comment: legs weak and buckling      Balance Overall balance assessment: Needs assistance Sitting-balance support: No upper extremity supported, Feet supported Sitting balance-Leahy Scale: Good Sitting balance - Comments: seated at EOB   Standing balance support: Bilateral upper extremity supported, During functional activity Standing balance-Leahy Scale: Poor Standing balance comment: using RW and without  ADL either performed or assessed with clinical judgement   ADL Overall ADL's : Needs assistance/impaired     Grooming: Moderate assistance;Sitting;Minimal assistance   Upper Body Bathing: Minimal assistance;Moderate  assistance;Sitting   Lower Body Bathing: Maximal assistance;Sitting/lateral leans   Upper Body Dressing : Minimal assistance;Moderate assistance;Sitting   Lower Body Dressing: Maximal assistance;Sitting/lateral leans   Toilet Transfer: Moderate assistance;Squat-pivot;Stand-pivot Toilet Transfer Details (indicate cue type and reason): Simulated via EOB to chair transfer. Toileting- Clothing Manipulation and Hygiene: Maximal assistance;Bed level               Vision Baseline Vision/History: 1 Wears glasses Ability to See in Adequate Light: 1 Impaired Patient Visual Report: No change from baseline Vision Assessment?: No apparent visual deficits     Perception Perception: Not tested       Praxis Praxis: Not tested       Pertinent Vitals/Pain Pain Assessment Pain Assessment: Faces Faces Pain Scale: No hurt     Extremity/Trunk Assessment Upper Extremity Assessment Upper Extremity Assessment: Generalized weakness (R and L UE limited to ~50% available range for shoulder flexion. Able to grasp RW.)   Lower Extremity Assessment Lower Extremity Assessment: Defer to PT evaluation   Cervical / Trunk Assessment Cervical / Trunk Assessment: Kyphotic   Communication Communication Communication: No apparent difficulties   Cognition Arousal: Alert Behavior During Therapy: WFL for tasks assessed/performed Overall Cognitive Status: History of cognitive impairments - at baseline                                 General Comments: Was not oriented to place until nursing informed him of where he was.                      Home Living Family/patient expects to be discharged to:: Assisted living                             Home Equipment: Rolling Walker (2 wheels);Wheelchair - manual          Prior Functioning/Environment Prior Level of Function : Needs assist       Physical Assist : Mobility (physical);ADLs (physical)   ADLs (physical):  Bathing;Dressing;Toileting;IADLs Mobility Comments: Pt reports transfer to w/c via squat pivot or anterior posterior transfer. Pt later reported he did ambulate some with RW. ADLs Comments: Pt reports assist for bathing, dressing, and toileting by ALF staff.        OT Problem List: Decreased strength;Decreased range of motion;Decreased activity tolerance;Impaired balance (sitting and/or standing)      OT Treatment/Interventions: Self-care/ADL training;Therapeutic exercise;Therapeutic activities;Patient/family education;Balance training    OT Goals(Current goals can be found in the care plan section) Acute Rehab OT Goals Patient Stated Goal: return home OT Goal Formulation: With patient Time For Goal Achievement: 08/27/23 Potential to Achieve Goals: Good  OT Frequency: Min 2X/week    Co-evaluation PT/OT/SLP Co-Evaluation/Treatment: Yes Reason for Co-Treatment: To address functional/ADL transfers   OT goals addressed during session: ADL's and self-care                       End of Session Equipment Utilized During Treatment: Rolling walker (2 wheels) Nurse Communication: Mobility status  Activity Tolerance: Patient tolerated treatment well Patient left: in chair;with call bell/phone within reach  OT Visit Diagnosis: Unsteadiness on feet (R26.81);Other abnormalities of gait and mobility (R26.89);Muscle weakness (  generalized) (M62.81)                Time: 9185-9170 OT Time Calculation (min): 15 min Charges:  OT General Charges $OT Visit: 1 Visit OT Evaluation $OT Eval Low Complexity: 1 Low  Carsten Carstarphen OT, MOT  Jayson Person 08/13/2023, 9:20 AM

## 2023-08-13 NOTE — TOC Progression Note (Signed)
 Transition of Care [facility]) - Progression Note    Patient Details  Name: Casey Cooley MRN: [id number] Date of Birth: 01-18-1933  Transition of Care [facility]) CM/SW Contact  Roise Falcon, RN Phone Number: 08/13/2023, 3:12 PM  Clinical Narrative:     Pt is from [facility] term. A call was made to [facility], [facility] left VM.   Expected Discharge Plan: Skilled Nursing Facility Barriers to Discharge: No Barriers Identified  Expected Discharge Plan and Services       Living arrangements for the past 2 months: Skilled Nursing Facility                                       Social Determinants of Health (SDOH) Interventions SDOH Screenings   Food Insecurity: No Food Insecurity (08/13/2023)  Housing: Low Risk  (08/13/2023)  Transportation Needs: No Transportation Needs (08/13/2023)  Utilities: Not At Risk (08/13/2023)  Financial Resource Strain: Low Risk  (12/12/2019)   Received from [facility], [facility]  Physical Activity: Inactive (12/12/2019)   Received from [facility], [facility]  Social Connections: Socially Isolated (08/13/2023)  Stress: Stress Concern Present (12/12/2019)   Received from [facility], [facility]  Tobacco Use: High Risk (08/12/2023)    Readmission Risk Interventions     No data to display

## 2023-08-14 ENCOUNTER — Inpatient Hospital Stay (HOSPITAL_COMMUNITY): Payer: Medicare Other

## 2023-08-14 DIAGNOSIS — J449 Chronic obstructive pulmonary disease, unspecified: Secondary | ICD-10-CM | POA: Diagnosis not present

## 2023-08-14 DIAGNOSIS — J121 Respiratory syncytial virus pneumonia: Secondary | ICD-10-CM | POA: Diagnosis not present

## 2023-08-14 DIAGNOSIS — N1832 Chronic kidney disease, stage 3b: Secondary | ICD-10-CM

## 2023-08-14 DIAGNOSIS — I4892 Unspecified atrial flutter: Secondary | ICD-10-CM | POA: Diagnosis not present

## 2023-08-14 DIAGNOSIS — R5381 Other malaise: Secondary | ICD-10-CM | POA: Diagnosis not present

## 2023-08-14 LAB — BASIC METABOLIC PANEL
Anion gap: 7 (ref 5–15)
BUN: 49 mg/dL — ABNORMAL HIGH (ref 8–23)
CO2: 26 mmol/L (ref 22–32)
Calcium: 8.7 mg/dL — ABNORMAL LOW (ref 8.9–10.3)
Chloride: 105 mmol/L (ref 98–111)
Creatinine, Ser: 1.8 mg/dL — ABNORMAL HIGH (ref 0.61–1.24)
GFR, Estimated: 36 mL/min — ABNORMAL LOW (ref >60)
Glucose, Bld: 142 mg/dL — ABNORMAL HIGH (ref 70–99)
Potassium: 4.4 mmol/L (ref 3.5–5.1)
Sodium: 138 mmol/L (ref 135–145)

## 2023-08-14 LAB — BLOOD CULTURE ID PANEL (REFLEXED) - BCID2

## 2023-08-14 LAB — HEMOGLOBIN A1C
Hgb A1c MFr Bld: 6.2 % — ABNORMAL HIGH (ref 4.8–5.6)
Mean Plasma Glucose: 131 mg/dL

## 2023-08-14 LAB — GLUCOSE, CAPILLARY
Glucose-Capillary: 123 mg/dL — ABNORMAL HIGH (ref 70–99)
Glucose-Capillary: 162 mg/dL — ABNORMAL HIGH (ref 70–99)
Glucose-Capillary: 150 mg/dL — ABNORMAL HIGH (ref 70–99)
Glucose-Capillary: 152 mg/dL — ABNORMAL HIGH (ref 70–99)

## 2023-08-14 MED ORDER — DEXTROMETHORPHAN POLISTIREX ER 30 MG/5ML PO SUER: 30.0000 mg | Freq: Two times a day (BID) | ORAL | Status: DC | PRN

## 2023-08-14 MED ORDER — IPRATROPIUM-ALBUTEROL 0.5-2.5 (3) MG/3ML IN SOLN: 3.0000 mL | RESPIRATORY_TRACT | Status: DC | PRN

## 2023-08-14 MED ORDER — DOXYCYCLINE HYCLATE 100 MG PO CAPS: 100.0000 mg | ORAL_CAPSULE | Freq: Two times a day (BID) | ORAL | Status: AC

## 2023-08-14 MED ORDER — PREDNISONE 20 MG PO TABS
20.0000 mg | ORAL_TABLET | Freq: Every day | ORAL | 0 refills | Status: AC
Start: 2023-08-15 — End: 2023-08-20

## 2023-08-14 MED ORDER — APIXABAN 2.5 MG PO TABS: 2.5000 mg | ORAL_TABLET | Freq: Two times a day (BID) | ORAL | Status: DC

## 2023-08-14 NOTE — Progress Notes (Signed)
 Rounded on patient and patient did not have his nasal cannula in place.  Patient stated that he just could not stand it.  Checked patient's O2 SAT and it was 95% on Room Air.  Educated the patient on the need for oxygen while he is sleeping.  Patient agreed to put the oxygen back on.  Will continue to monitor.

## 2023-08-14 NOTE — TOC Progression Note (Signed)
 Transition of Care [facility]) - Progression Note    Patient Details  Name: Casey Cooley MRN: [id number] Date of Birth: [birth year]  Transition of Care [facility]) CM/SW Contact  Roise Falcon, RN Phone Number: 08/14/2023, 3:17 PM  Clinical Narrative:     [facility] pt. EMS was called. RN aware.   Expected Discharge Plan: Skilled Nursing Facility Barriers to Discharge: No Barriers Identified  Expected Discharge Plan and Services       Living arrangements for the past 2 months: Skilled Nursing Facility Expected Discharge Date: 08/14/23                                     Social Determinants of Health (SDOH) Interventions SDOH Screenings   Food Insecurity: No Food Insecurity (08/13/2023)  Housing: Low Risk  (08/13/2023)  Transportation Needs: No Transportation Needs (08/13/2023)  Utilities: Not At Risk (08/13/2023)  Financial Resource Strain: Low Risk  (12/12/2019)   Received from [facility], [facility]  Physical Activity: Inactive (12/12/2019)   Received from [facility], [facility]  Social Connections: Socially Isolated (08/13/2023)  Stress: Stress Concern Present (12/12/2019)   Received from [facility]  Tobacco Use: High Risk (08/12/2023)    Readmission Risk Interventions     No data to display

## 2023-08-14 NOTE — Progress Notes (Signed)
 Report given to nurse Shanda Bumps at [facility].

## 2023-08-14 NOTE — TOC Progression Note (Signed)
 Transition of Care [facility]) - Progression Note    Patient Details  Name: Casey Cooley MRN: [id number] Date of Birth: 11-13-1932  Transition of Care [facility]) CM/SW Contact  Roise Falcon, RN Phone Number: 08/14/2023, 2:19 PM  Clinical Narrative:    [name] is concerned about pt returning there. Discharge summary re-faxed. Nathanel states, fax was not received. Waiting on [facility].    Expected Discharge Plan: Skilled Nursing Facility Barriers to Discharge: No Barriers Identified  Expected Discharge Plan and Services       Living arrangements for the past 2 months: Skilled Nursing Facility Expected Discharge Date: 08/14/23                                     Social Determinants of Health (SDOH) Interventions SDOH Screenings   Food Insecurity: No Food Insecurity (08/13/2023)  Housing: Low Risk  (08/13/2023)  Transportation Needs: No Transportation Needs (08/13/2023)  Utilities: Not At Risk (08/13/2023)  Financial Resource Strain: Low Risk  (12/12/2019)   Received from [facility], [facility]  Physical Activity: Inactive (12/12/2019)   Received from [facility], [facility]  Social Connections: Socially Isolated (08/13/2023)  Stress: Stress Concern Present (12/12/2019)   Received from [facility], [facility]  Tobacco Use: High Risk (08/12/2023)    Readmission Risk Interventions     No data to display

## 2023-08-14 NOTE — Care Management Important Message (Signed)
 Important Message  Patient Details  Name: Casey Cooley MRN: [id number] Date of Birth: 08-13-1932   Important Message Given:  Yes - Medicare IM     Corey Harold 08/14/2023, 2:51 PM

## 2023-08-14 NOTE — Discharge Summary (Signed)
 Physician Discharge Summary  Casey Cooley FMW:[id number] DOB: 1932/08/17 DOA: 08/12/2023  PCP: [name] SQUIBB, MD  Admit date: 08/12/2023 Discharge date: 08/14/2023  Admitted From: ALF Disposition: ALF   Recommendations for Outpatient Follow-up:  Follow up with PCP in 2 weeks  Discharge Condition: STABLE   CODE STATUS: DNR DIET: Heart/carb    Brief Hospitalization Summary: Please see all hospital notes, images, labs for full details of the hospitalization. Admission provider HPI:  89 y.o. male with a history of hypertension, CKD stage III, COPD, hyperlipidemia, diabetes mellitus type 2, GERD patient is long-term resident at [facility] for last 40 years, his main next of kin currently is his niece, he has no drain, and spouse passed away for couple years ago.  At Baseline patient is ambulatory with a walker, communicative, with underlying dementia.  Patient was sent by his facility secondary to progressive weakness, increased lethargy, poor oral intake, as well increasing cough, dyspnea, underlying history of COPD, but no sign requirement, patient was obtundent when he was evaluated by EMS, he cannot provide any reliable complaints given his dementia and encephalopathy.  In ED patient with significant wheezing and decreased air entry, his VBG showing pCO2 less than 31, he was started on prolonged nebulizer treatment, received IV steroids, as well received 1 dose of IV Lasix , chest x-ray significant for multifocal left lung opacity, labs significant for baseline creatinine at 1.7, CT head with no acute finding, significant for advanced vascular disease, BP within normal limit at 82, white blood cell count within normal limit at 5.5, MCV elevated at 104, [facility] hospitalist consulted to admit  Hospital Course by problem list  RSV pneumonia with superimposed bacterial infection COPD exacerbation Acute hypoxemic respiratory failure with hypercarbia  -Patient presented with increased work of  breathing, significantly diminished air entry bilaterally, workup significant for RSV. -rapid response called 1/1 and placed on bipap due to high pCO2 level -he is now weaned to room air oxygen  -treated with steroids - reduced dose to 40 mg solumedrol BID  -With scheduled DuoNebs and as needed albuterol  -Resume home Symbicort -treated with IV Rocephin  and azithromycin  and will DC on 3 days of oral doxycycline  -encouraged use of incentive spirometry and flutter valve -Mucinex  ordered as needed -weaned to room air oxygen -discussed with [place] infection prevention: It is now more than 72 hours since diagnosis and his is no longer having fever so can take him off isolation now and he should not be contagious or shedding virus now that he is afebrile.       A flutter -Diagnosed during recent hospitalization in June -Rate seems to be controlled, not on any AV blocking agents -With apixaban , but will adjust dosing to age and renal function, we decreased dose from 5 to 2.5 mg p.o. twice daily    HTN -Carry history of hypertension, but he does not appear to be on antihypertensive medications, actually does appear all his cardiac meds resumed last admission secondary to hypotension. -Blood pressures acceptable   CKD 3b -baseline creatinine 1.6-1.9 -10 at 1.7, at baseline today -Avoid nephrotoxic medications   Diabetes mellitus, type II -A1c 6.2% - DC home glipizide due to risk of causing hypoglycemia at his advanced age and CKD   Deconditioning   GERD -Continue with PPI   Macrocytosis -Continue with B12 supplement   Chronic diastolic CHF Chronic lower extremity edema -Most recent echo June 2024 with a preserved EF -He is on Lasix  which is resumed   tobacco  use -Still smoking cigar and pipe intermittently in the facility, I will keep on nicotine  patch -counseled on tobacco cessation at bedside 08/13/23    Acute metabolic encephalopathy - RESOLVED  -He seems to be back to  his baseline.    Discharge Diagnoses:  Principal Problem:   RSV (respiratory syncytial virus pneumonia) Active Problems:   Physical deconditioning   GERD without esophagitis   COPD  ? GOLD 0 / mild AB     Atrial flutter (HCC)   Chronic kidney disease, stage 3b (HCC)   Leg swelling   Discharge Instructions:  Allergies as of 08/14/2023   No Known Allergies      Medication List     STOP taking these medications    glipiZIDE 2.5 MG 24 hr tablet Commonly known as: GLUCOTROL XL   LORazepam  0.5 MG tablet Commonly known as: ATIVAN    methocarbamol  500 MG tablet Commonly known as: ROBAXIN    mirtazapine  7.5 MG tablet Commonly known as: REMERON    nystatin ointment Commonly known as: MYCOSTATIN       TAKE these medications    acetaminophen  325 MG tablet Commonly known as: TYLENOL  Take 650 mg by mouth 2 (two) times daily.   apixaban  2.5 MG Tabs tablet Commonly known as: ELIQUIS  Take 1 tablet (2.5 mg total) by mouth 2 (two) times daily. What changed:  medication strength how much to take   budesonide-formoterol  160-4.5 MCG/ACT inhaler Commonly known as: SYMBICORT Inhale 2 puffs into the lungs 2 (two) times daily.   calcium  carbonate 1500 (600 Ca) MG Tabs tablet Commonly known as: OSCAL Take 600 mg of elemental calcium  by mouth daily.   cholecalciferol  25 MCG (1000 UNIT) tablet Commonly known as: VITAMIN D3 Take 2,000 Units by mouth daily.   dextromethorphan  30 MG/5ML liquid Commonly known as: Delsym  Take 5 mLs (30 mg total) by mouth 2 (two) times daily as needed for cough.   docusate sodium  100 MG capsule Commonly known as: COLACE Take 100 mg by mouth daily.   doxycycline  100 MG capsule Commonly known as: VIBRAMYCIN  Take 1 capsule (100 mg total) by mouth 2 (two) times daily for 3 days.   escitalopram  10 MG tablet Commonly known as: LEXAPRO  Take 1 tablet (10 mg total) by mouth daily.   fluticasone  50 MCG/ACT nasal spray Commonly known as:  FLONASE  Place 2 sprays into both nostrils daily as needed for allergies or rhinitis.   furosemide  40 MG tablet Commonly known as: LASIX  Take 40 mg by mouth daily.   guaiFENesin  600 MG 12 hr tablet Commonly known as: MUCINEX  Take 600 mg by mouth 2 (two) times daily as needed for cough or to loosen phlegm.   ipratropium-albuterol  0.5-2.5 (3) MG/3ML Soln Commonly known as: DUONEB Take 3 mLs by nebulization every 4 (four) hours as needed (shortness of breath, cough, wheezing).   lidocaine 4 % Place 1 patch onto the skin daily. Apply 1 patch to mid lower back every day for 12 hours on and 12 hours off.   loperamide 2 MG capsule Commonly known as: IMODIUM Take 2 mg by mouth as needed for diarrhea or loose stools.   Melatonin 10 MG Tabs Take 10 mg by mouth at bedtime.   multivitamin with minerals Tabs tablet Take 1 tablet by mouth daily.   Omeprazole -Sodium Bicarbonate  20-1100 MG Caps capsule Commonly known as: Zegerid  OTC Take 1 capsule by mouth daily before breakfast.   ondansetron  4 MG tablet Commonly known as: ZOFRAN  Take 4 mg by mouth every 8 (eight)  hours as needed for nausea or vomiting.   polyethylene glycol 17 g packet Commonly known as: MIRALAX  / GLYCOLAX  Take 17 g by mouth daily as needed for moderate constipation.   predniSONE  20 MG tablet Commonly known as: DELTASONE  Take 1 tablet (20 mg total) by mouth daily with breakfast for 5 days. Start taking on: August 15, 2023   simvastatin  20 MG tablet Commonly known as: ZOCOR  Take 1 tablet (20 mg total) by mouth daily at 6 PM.   sodium chloride  5 % ophthalmic solution Commonly known as: MURO 128 Place 1 drop into both eyes 2 (two) times daily.   tamsulosin  0.4 MG Caps capsule Commonly known as: FLOMAX  Take 0.4 mg by mouth daily.   zinc  oxide 20 % ointment Apply 1 Application topically daily as needed (Redness).        Follow-up Information     Hague, Kerney SQUIBB, MD. Schedule an appointment as soon as  possible for a visit in 2 week(s).   Specialty: Internal Medicine Why: Hospital Follow Up Contact information: [address] [place] [address] [phone]                No Known Allergies Allergies as of 08/14/2023   No Known Allergies      Medication List     STOP taking these medications    glipiZIDE 2.5 MG 24 hr tablet Commonly known as: GLUCOTROL XL   LORazepam  0.5 MG tablet Commonly known as: ATIVAN    methocarbamol  500 MG tablet Commonly known as: ROBAXIN    mirtazapine  7.5 MG tablet Commonly known as: REMERON    nystatin ointment Commonly known as: MYCOSTATIN       TAKE these medications    acetaminophen  325 MG tablet Commonly known as: TYLENOL  Take 650 mg by mouth 2 (two) times daily.   apixaban  2.5 MG Tabs tablet Commonly known as: ELIQUIS  Take 1 tablet (2.5 mg total) by mouth 2 (two) times daily. What changed:  medication strength how much to take   budesonide-formoterol  160-4.5 MCG/ACT inhaler Commonly known as: SYMBICORT Inhale 2 puffs into the lungs 2 (two) times daily.   calcium  carbonate 1500 (600 Ca) MG Tabs tablet Commonly known as: OSCAL Take 600 mg of elemental calcium  by mouth daily.   cholecalciferol  25 MCG (1000 UNIT) tablet Commonly known as: VITAMIN D3 Take 2,000 Units by mouth daily.   dextromethorphan  30 MG/5ML liquid Commonly known as: Delsym  Take 5 mLs (30 mg total) by mouth 2 (two) times daily as needed for cough.   docusate sodium  100 MG capsule Commonly known as: COLACE Take 100 mg by mouth daily.   doxycycline  100 MG capsule Commonly known as: VIBRAMYCIN  Take 1 capsule (100 mg total) by mouth 2 (two) times daily for 3 days.   escitalopram  10 MG tablet Commonly known as: LEXAPRO  Take 1 tablet (10 mg total) by mouth daily.   fluticasone  50 MCG/ACT nasal spray Commonly known as: FLONASE  Place 2 sprays into both nostrils daily as needed for allergies or rhinitis.   furosemide  40 MG  tablet Commonly known as: LASIX  Take 40 mg by mouth daily.   guaiFENesin  600 MG 12 hr tablet Commonly known as: MUCINEX  Take 600 mg by mouth 2 (two) times daily as needed for cough or to loosen phlegm.   ipratropium-albuterol  0.5-2.5 (3) MG/3ML Soln Commonly known as: DUONEB Take 3 mLs by nebulization every 4 (four) hours as needed (shortness of breath, cough, wheezing).   lidocaine 4 % Place 1 patch onto the skin  daily. Apply 1 patch to mid lower back every day for 12 hours on and 12 hours off.   loperamide 2 MG capsule Commonly known as: IMODIUM Take 2 mg by mouth as needed for diarrhea or loose stools.   Melatonin 10 MG Tabs Take 10 mg by mouth at bedtime.   multivitamin with minerals Tabs tablet Take 1 tablet by mouth daily.   Omeprazole -Sodium Bicarbonate  20-1100 MG Caps capsule Commonly known as: Zegerid  OTC Take 1 capsule by mouth daily before breakfast.   ondansetron  4 MG tablet Commonly known as: ZOFRAN  Take 4 mg by mouth every 8 (eight) hours as needed for nausea or vomiting.   polyethylene glycol 17 g packet Commonly known as: MIRALAX  / GLYCOLAX  Take 17 g by mouth daily as needed for moderate constipation.   predniSONE  20 MG tablet Commonly known as: DELTASONE  Take 1 tablet (20 mg total) by mouth daily with breakfast for 5 days. Start taking on: August 15, 2023   simvastatin  20 MG tablet Commonly known as: ZOCOR  Take 1 tablet (20 mg total) by mouth daily at 6 PM.   sodium chloride  5 % ophthalmic solution Commonly known as: MURO 128 Place 1 drop into both eyes 2 (two) times daily.   tamsulosin  0.4 MG Caps capsule Commonly known as: FLOMAX  Take 0.4 mg by mouth daily.   zinc  oxide 20 % ointment Apply 1 Application topically daily as needed (Redness).        Procedures/Studies: DG CHEST PORT 1 VIEW Result Date: 08/14/2023 CLINICAL DATA:  Acute on chronic respiratory failure EXAM: PORTABLE CHEST 1 VIEW COMPARISON:  X-ray 08/12/2023 FINDINGS:  Underinflation. There is some linear opacity along the bases, favor atelectasis. Right-greater-than-left. No pneumothorax, effusion, edema or consolidation. Stable cardiopericardial silhouette. Calcified aorta. Degenerative changes of the spine and right shoulder. IMPRESSION: Underinflation with a bandlike changes at the bases, favoring atelectasis. Electronically Signed   By: Ranell Bring M.D.   On: 08/14/2023 11:25   DG Chest Port 1 View Result Date: 08/12/2023 CLINICAL DATA:  Cough, shortness of breath. EXAM: PORTABLE CHEST 1 VIEW COMPARISON:  Chest radiograph 01/23/2023. FINDINGS: Low lung volumes accentuate the pulmonary vasculature and cardiomediastinal silhouette. Patchy opacities in the left mid and lower lung may represent aspiration or infection. No pleural effusion or pneumothorax. Visualized bones and upper abdomen are unremarkable. IMPRESSION: Patchy opacities in the left mid and lower lung may represent aspiration or infection. Electronically Signed   By: Ryan Chess M.D.   On: 08/12/2023 14:51   CT Head Wo Contrast Result Date: 08/12/2023 CLINICAL DATA:  Mental status change, unknown cause.  Weakness. EXAM: CT HEAD WITHOUT CONTRAST TECHNIQUE: Contiguous axial images were obtained from the base of the skull through the vertex without intravenous contrast. RADIATION DOSE REDUCTION: This exam was performed according to the departmental dose-optimization program which includes automated exposure control, adjustment of the mA and/or kV according to patient size and/or use of iterative reconstruction technique. COMPARISON:  Head CT 01/25/2023. FINDINGS: Brain: No acute hemorrhage. Stable background of severe chronic small-vessel disease. Cortical gray-white differentiation is otherwise preserved. Prominence of the ventricles and sulci within expected range for age. No hydrocephalus or extra-axial collection. No mass effect or midline shift. Vascular: No hyperdense vessel or unexpected calcification.  Skull: No calvarial fracture or suspicious bone lesion. Skull base is unremarkable. Sinuses/Orbits: Mild paranasal sinus disease. Bilateral scleral buckles. Other: None. IMPRESSION: 1. No acute intracranial abnormality. 2. Stable background of severe chronic small-vessel disease. Electronically Signed   By: Ryan Chess  M.D.   On: 08/12/2023 14:50     Subjective: Pt reports he is breathing much better, no fever or chills, very minimal cough.    Discharge Exam: Vitals:   08/14/23 0907 08/14/23 0912  BP:    Pulse:    Resp:    Temp:    SpO2: 94% 98%   Vitals:   08/14/23 0342 08/14/23 0406 08/14/23 0907 08/14/23 0912  BP:  130/68    Pulse:  86    Resp:  20    Temp:  98.2 F (36.8 C)    TempSrc:  Oral    SpO2: 95% 93% 94% 98%  Weight:      Height:       General: Pt is alert, awake, not in acute distress Cardiovascular: normal S1/S2 +, no rubs, no gallops Respiratory: no increased work of breathing, no rhonchi Abdominal: Soft, NT, ND, bowel sounds + Extremities: no edema, no cyanosis   The results of significant diagnostics from this hospitalization (including imaging, microbiology, ancillary and laboratory) are listed below for reference.     Microbiology: Recent Results (from the past 240 hours)  Resp panel by RT-PCR (RSV, Flu A&B, Covid) Anterior Nasal Swab     Status: Abnormal   Collection Time: 08/12/23  2:25 PM   Specimen: Anterior Nasal Swab  Result Value Ref Range Status   SARS Coronavirus 2 by RT PCR NEGATIVE NEGATIVE Final    Comment: (NOTE) SARS-CoV-2 target nucleic acids are NOT DETECTED.  The SARS-CoV-2 RNA is generally detectable in upper respiratory specimens during the acute phase of infection. The lowest concentration of SARS-CoV-2 viral copies this assay can detect is 138 copies/mL. A negative result does not preclude SARS-Cov-2 infection and should not be used as the sole basis for treatment or other patient management decisions. A negative result  may occur with  improper specimen collection/handling, submission of specimen other than nasopharyngeal swab, presence of viral mutation(s) within the areas targeted by this assay, and inadequate number of viral copies(<138 copies/mL). A negative result must be combined with clinical observations, patient history, and epidemiological information. The expected result is Negative.  Fact Sheet for Patients:  bloggercourse.com  Fact Sheet for Healthcare Providers:  seriousbroker.it  This test is no t yet approved or cleared by the [place]  FDA and  has been authorized for detection and/or diagnosis of SARS-CoV-2 by FDA under an Emergency Use Authorization (EUA). This EUA will remain  in effect (meaning this test can be used) for the duration of the COVID-19 declaration under Section 564(b)(1) of the Act, 21 U.S.C.section 360bbb-3(b)(1), unless the authorization is terminated  or revoked sooner.       Influenza A by PCR NEGATIVE NEGATIVE Final   Influenza B by PCR NEGATIVE NEGATIVE Final    Comment: (NOTE) The Xpert Xpress SARS-CoV-2/FLU/RSV plus assay is intended as an aid in the diagnosis of influenza from Nasopharyngeal swab specimens and should not be used as a sole basis for treatment. Nasal washings and aspirates are unacceptable for Xpert Xpress SARS-CoV-2/FLU/RSV testing.  Fact Sheet for Patients: bloggercourse.com  Fact Sheet for Healthcare Providers: seriousbroker.it  This test is not yet approved or cleared by the [place]  FDA and has been authorized for detection and/or diagnosis of SARS-CoV-2 by FDA under an Emergency Use Authorization (EUA). This EUA will remain in effect (meaning this test can be used) for the duration of the COVID-19 declaration under Section 564(b)(1) of the Act, 21 U.S.C. section 360bbb-3(b)(1), unless the authorization is terminated  or revoked.     Resp Syncytial Virus by PCR POSITIVE (A) NEGATIVE Final    Comment: (NOTE) Fact Sheet for Patients: bloggercourse.com  Fact Sheet for Healthcare Providers: seriousbroker.it  This test is not yet approved or cleared by the [place]  FDA and has been authorized for detection and/or diagnosis of SARS-CoV-2 by FDA under an Emergency Use Authorization (EUA). This EUA will remain in effect (meaning this test can be used) for the duration of the COVID-19 declaration under Section 564(b)(1) of the Act, 21 U.S.C. section 360bbb-3(b)(1), unless the authorization is terminated or revoked.  Performed at [facility], [address] [place], [place] [address]   Culture, blood (routine x 2) Call MD if unable to obtain prior to antibiotics being given     Status: None (Preliminary result)   Collection Time: 08/12/23  4:44 PM   Specimen: BLOOD  Result Value Ref Range Status   Specimen Description BLOOD BLOOD RIGHT FOREARM  Final   Special Requests   Final    BOTTLES DRAWN AEROBIC AND ANAEROBIC Blood Culture results may not be optimal due to an inadequate volume of blood received in culture bottles   Culture   Final    NO GROWTH 2 DAYS Performed at [facility], [address] [place], [place] [address]    Report Status PENDING  Incomplete  Culture, blood (routine x 2) Call MD if unable to obtain prior to antibiotics being given     Status: None (Preliminary result)   Collection Time: 08/12/23  4:44 PM   Specimen: BLOOD  Result Value Ref Range Status   Specimen Description   Final    BLOOD RIGHT ANTECUBITAL Performed at [facility], [address] [place], [place] [address]    Special Requests   Final    BOTTLES DRAWN AEROBIC AND ANAEROBIC Blood Culture results may not be optimal due to an inadequate volume of blood received in culture bottles Performed at [facility], [address], [place], [place] [address]     Culture  Setup Time   Final    AEROBIC BOTTLE ONLY GRAM POSITIVE COCCI GRAM POSITIVE RODS Gram Stain Report Called to,Read Back By and Verified With: FORBES MEYERS RN [phone] K FORSYTH GRAM POSITIVE RODS IN ANAEROBIC BOTTLE GRAM STAIN REVIEWED-AGREE WITH RESULT DRT FOR AERB Organism ID to follow    Culture   Final    TOO YOUNG TO READ Performed at [facility], [address], [place], [place] [address]    Report Status PENDING  Incomplete  Blood Culture ID Panel (Reflexed)     Status: None   Collection Time: 08/12/23  4:44 PM  Result Value Ref Range Status   Enterococcus faecalis NOT DETECTED NOT DETECTED Final   Enterococcus Faecium NOT DETECTED NOT DETECTED Final   Listeria monocytogenes NOT DETECTED NOT DETECTED Final   Staphylococcus species NOT DETECTED NOT DETECTED Final   Staphylococcus aureus (BCID) NOT DETECTED NOT DETECTED Final   Staphylococcus epidermidis NOT DETECTED NOT DETECTED Final   Staphylococcus lugdunensis NOT DETECTED NOT DETECTED Final   Streptococcus species NOT DETECTED NOT DETECTED Final   Streptococcus agalactiae NOT DETECTED NOT DETECTED Final   Streptococcus pneumoniae NOT DETECTED NOT DETECTED Final   Streptococcus pyogenes NOT DETECTED NOT DETECTED Final   A.calcoaceticus-baumannii NOT DETECTED NOT DETECTED Final   Bacteroides fragilis NOT DETECTED NOT DETECTED Final   Enterobacterales NOT DETECTED NOT DETECTED Final   Enterobacter cloacae complex NOT DETECTED NOT DETECTED Final   Escherichia  coli NOT DETECTED NOT DETECTED Final   Klebsiella aerogenes NOT DETECTED NOT DETECTED Final   Klebsiella oxytoca NOT DETECTED NOT DETECTED Final   Klebsiella pneumoniae NOT DETECTED NOT DETECTED Final   Proteus species NOT DETECTED NOT DETECTED Final   Salmonella species NOT DETECTED NOT DETECTED Final   Serratia marcescens NOT DETECTED NOT DETECTED Final   Haemophilus influenzae NOT DETECTED NOT DETECTED Final   Neisseria meningitidis NOT DETECTED  NOT DETECTED Final   Pseudomonas aeruginosa NOT DETECTED NOT DETECTED Final   Stenotrophomonas maltophilia NOT DETECTED NOT DETECTED Final   Candida albicans NOT DETECTED NOT DETECTED Final   Candida auris NOT DETECTED NOT DETECTED Final   Candida glabrata NOT DETECTED NOT DETECTED Final   Candida krusei NOT DETECTED NOT DETECTED Final   Candida parapsilosis NOT DETECTED NOT DETECTED Final   Candida tropicalis NOT DETECTED NOT DETECTED Final   Cryptococcus neoformans/gattii NOT DETECTED NOT DETECTED Final    Comment: Performed at [facility], [address], [place], [place] [address]  MRSA Next Gen by PCR, Nasal     Status: None   Collection Time: 08/13/23 12:18 AM   Specimen: Nasal Mucosa; Nasal Swab  Result Value Ref Range Status   MRSA by PCR Next Gen NOT DETECTED NOT DETECTED Final    Comment: (NOTE) The GeneXpert MRSA Assay (FDA approved for NASAL specimens only), is one component of a comprehensive MRSA colonization surveillance program. It is not intended to diagnose MRSA infection nor to guide or monitor treatment for MRSA infections. Test performance is not FDA approved in patients less than 89 years old. Performed at [facility], [address] [place], [place] [address]      Labs: BNP (last 3 results) Recent Labs    01/23/23 1055 08/12/23 1424  BNP 68.0 82.0   Basic Metabolic Panel: Recent Labs  Lab 08/12/23 1424 08/13/23 0450 08/14/23 0346  NA 140 140 138  K 4.5 4.5 4.4  CL 104 104 105  CO2 28 25 26   GLUCOSE 67* 177* 142*  BUN 27* 34* 49*  CREATININE 1.70* 1.80* 1.80*  CALCIUM  8.7* 8.5* 8.7*  MG 2.2  --   --    Liver Function Tests: Recent Labs  Lab 08/12/23 1424  AST 34  ALT 21  ALKPHOS 93  BILITOT 0.8  PROT 7.7  ALBUMIN 3.2*   No results for input(s): LIPASE, AMYLASE in the last 168 hours. Recent Labs  Lab 08/13/23 0450  AMMONIA 24   CBC: Recent Labs  Lab 08/12/23 1424 08/13/23 0450  WBC 5.5 4.8  NEUTROABS 2.3  --    HGB 12.7* 11.0*  HCT 41.2 35.7*  MCV 104.6* 104.7*  PLT 165 146*   Cardiac Enzymes: No results for input(s): CKTOTAL, CKMB, CKMBINDEX, TROPONINI in the last 168 hours. BNP: Invalid input(s): POCBNP CBG: Recent Labs  Lab 08/13/23 1149 08/13/23 1551 08/13/23 1937 08/14/23 0752 08/14/23 1123  GLUCAP 173* 138* 158* 123* 162*   D-Dimer No results for input(s): DDIMER in the last 72 hours. Hgb A1c Recent Labs    08/12/23 1424  HGBA1C 6.2*   Lipid Profile No results for input(s): CHOL, HDL, LDLCALC, TRIG, CHOLHDL, LDLDIRECT in the last 72 hours. Thyroid  function studies No results for input(s): TSH, T4TOTAL, T3FREE, THYROIDAB in the last 72 hours.  Invalid input(s): FREET3 Anemia work up No results for input(s): VITAMINB12, FOLATE, FERRITIN, TIBC, IRON, RETICCTPCT in the last 72 hours. Urinalysis    Component Value Date/Time   COLORURINE YELLOW  08/12/2023 1504   APPEARANCEUR CLEAR 08/12/2023 1504   LABSPEC 1.012 08/12/2023 1504   PHURINE 5.0 08/12/2023 1504   GLUCOSEU NEGATIVE 08/12/2023 1504   HGBUR NEGATIVE 08/12/2023 1504   BILIRUBINUR NEGATIVE 08/12/2023 1504   KETONESUR NEGATIVE 08/12/2023 1504   PROTEINUR NEGATIVE 08/12/2023 1504   NITRITE NEGATIVE 08/12/2023 1504   LEUKOCYTESUR TRACE (A) 08/12/2023 1504   Sepsis Labs Recent Labs  Lab 08/12/23 1424 08/13/23 0450  WBC 5.5 4.8   Microbiology Recent Results (from the past 240 hours)  Resp panel by RT-PCR (RSV, Flu A&B, Covid) Anterior Nasal Swab     Status: Abnormal   Collection Time: 08/12/23  2:25 PM   Specimen: Anterior Nasal Swab  Result Value Ref Range Status   SARS Coronavirus 2 by RT PCR NEGATIVE NEGATIVE Final    Comment: (NOTE) SARS-CoV-2 target nucleic acids are NOT DETECTED.  The SARS-CoV-2 RNA is generally detectable in upper respiratory specimens during the acute phase of infection. The lowest concentration of SARS-CoV-2 viral copies this  assay can detect is 138 copies/mL. A negative result does not preclude SARS-Cov-2 infection and should not be used as the sole basis for treatment or other patient management decisions. A negative result may occur with  improper specimen collection/handling, submission of specimen other than nasopharyngeal swab, presence of viral mutation(s) within the areas targeted by this assay, and inadequate number of viral copies(<138 copies/mL). A negative result must be combined with clinical observations, patient history, and epidemiological information. The expected result is Negative.  Fact Sheet for Patients:  bloggercourse.com  Fact Sheet for Healthcare Providers:  seriousbroker.it  This test is no t yet approved or cleared by the [place]  FDA and  has been authorized for detection and/or diagnosis of SARS-CoV-2 by FDA under an Emergency Use Authorization (EUA). This EUA will remain  in effect (meaning this test can be used) for the duration of the COVID-19 declaration under Section 564(b)(1) of the Act, 21 U.S.C.section 360bbb-3(b)(1), unless the authorization is terminated  or revoked sooner.       Influenza A by PCR NEGATIVE NEGATIVE Final   Influenza B by PCR NEGATIVE NEGATIVE Final    Comment: (NOTE) The Xpert Xpress SARS-CoV-2/FLU/RSV plus assay is intended as an aid in the diagnosis of influenza from Nasopharyngeal swab specimens and should not be used as a sole basis for treatment. Nasal washings and aspirates are unacceptable for Xpert Xpress SARS-CoV-2/FLU/RSV testing.  Fact Sheet for Patients: bloggercourse.com  Fact Sheet for Healthcare Providers: seriousbroker.it  This test is not yet approved or cleared by the [place]  FDA and has been authorized for detection and/or diagnosis of SARS-CoV-2 by FDA under an Emergency Use Authorization (EUA). This EUA will  remain in effect (meaning this test can be used) for the duration of the COVID-19 declaration under Section 564(b)(1) of the Act, 21 U.S.C. section 360bbb-3(b)(1), unless the authorization is terminated or revoked.     Resp Syncytial Virus by PCR POSITIVE (A) NEGATIVE Final    Comment: (NOTE) Fact Sheet for Patients: bloggercourse.com  Fact Sheet for Healthcare Providers: seriousbroker.it  This test is not yet approved or cleared by the [place]  FDA and has been authorized for detection and/or diagnosis of SARS-CoV-2 by FDA under an Emergency Use Authorization (EUA). This EUA will remain in effect (meaning this test can be used) for the duration of the COVID-19 declaration under Section 564(b)(1) of the Act, 21 U.S.C. section 360bbb-3(b)(1), unless the authorization is terminated or revoked.  Performed at [facility], [address] [place], [place] [address]   Culture, blood (routine x 2) Call MD if unable to obtain prior to antibiotics being given     Status: None (Preliminary result)   Collection Time: 08/12/23  4:44 PM   Specimen: BLOOD  Result Value Ref Range Status   Specimen Description BLOOD BLOOD RIGHT FOREARM  Final   Special Requests   Final    BOTTLES DRAWN AEROBIC AND ANAEROBIC Blood Culture results may not be optimal due to an inadequate volume of blood received in culture bottles   Culture   Final    NO GROWTH 2 DAYS Performed at [facility], [address] [place], [place] [address]    Report Status PENDING  Incomplete  Culture, blood (routine x 2) Call MD if unable to obtain prior to antibiotics being given     Status: None (Preliminary result)   Collection Time: 08/12/23  4:44 PM   Specimen: BLOOD  Result Value Ref Range Status   Specimen Description   Final    BLOOD RIGHT ANTECUBITAL Performed at [facility], [address] [place], [place] [address]    Special Requests   Final    BOTTLES DRAWN  AEROBIC AND ANAEROBIC Blood Culture results may not be optimal due to an inadequate volume of blood received in culture bottles Performed at [facility], [address] [place], [place] [address]    Culture  Setup Time   Final    AEROBIC BOTTLE ONLY GRAM POSITIVE COCCI GRAM POSITIVE RODS Gram Stain Report Called to,Read Back By and Verified With: FORBES MEYERS RN [phone] K FORSYTH GRAM POSITIVE RODS IN ANAEROBIC BOTTLE GRAM STAIN REVIEWED-AGREE WITH RESULT DRT FOR AERB Organism ID to follow    Culture   Final    TOO YOUNG TO READ Performed at [facility], [address], [place], [place] [address]    Report Status PENDING  Incomplete  Blood Culture ID Panel (Reflexed)     Status: None   Collection Time: 08/12/23  4:44 PM  Result Value Ref Range Status   Enterococcus faecalis NOT DETECTED NOT DETECTED Final   Enterococcus Faecium NOT DETECTED NOT DETECTED Final   Listeria monocytogenes NOT DETECTED NOT DETECTED Final   Staphylococcus species NOT DETECTED NOT DETECTED Final   Staphylococcus aureus (BCID) NOT DETECTED NOT DETECTED Final   Staphylococcus epidermidis NOT DETECTED NOT DETECTED Final   Staphylococcus lugdunensis NOT DETECTED NOT DETECTED Final   Streptococcus species NOT DETECTED NOT DETECTED Final   Streptococcus agalactiae NOT DETECTED NOT DETECTED Final   Streptococcus pneumoniae NOT DETECTED NOT DETECTED Final   Streptococcus pyogenes NOT DETECTED NOT DETECTED Final   A.calcoaceticus-baumannii NOT DETECTED NOT DETECTED Final   Bacteroides fragilis NOT DETECTED NOT DETECTED Final   Enterobacterales NOT DETECTED NOT DETECTED Final   Enterobacter cloacae complex NOT DETECTED NOT DETECTED Final   Escherichia coli NOT DETECTED NOT DETECTED Final   Klebsiella aerogenes NOT DETECTED NOT DETECTED Final   Klebsiella oxytoca NOT DETECTED NOT DETECTED Final   Klebsiella pneumoniae NOT DETECTED NOT DETECTED Final   Proteus species NOT DETECTED NOT DETECTED Final    Salmonella species NOT DETECTED NOT DETECTED Final   Serratia marcescens NOT DETECTED NOT DETECTED Final   Haemophilus influenzae NOT DETECTED NOT DETECTED Final   Neisseria meningitidis NOT DETECTED NOT DETECTED Final   Pseudomonas aeruginosa NOT DETECTED NOT DETECTED Final   Stenotrophomonas maltophilia NOT DETECTED NOT DETECTED Final   Candida albicans NOT DETECTED NOT  DETECTED Final   Candida auris NOT DETECTED NOT DETECTED Final   Candida glabrata NOT DETECTED NOT DETECTED Final   Candida krusei NOT DETECTED NOT DETECTED Final   Candida parapsilosis NOT DETECTED NOT DETECTED Final   Candida tropicalis NOT DETECTED NOT DETECTED Final   Cryptococcus neoformans/gattii NOT DETECTED NOT DETECTED Final    Comment: Performed at [facility], [address] [place], [place] [address]  MRSA Next Gen by PCR, Nasal     Status: None   Collection Time: 08/13/23 12:18 AM   Specimen: Nasal Mucosa; Nasal Swab  Result Value Ref Range Status   MRSA by PCR Next Gen NOT DETECTED NOT DETECTED Final    Comment: (NOTE) The GeneXpert MRSA Assay (FDA approved for NASAL specimens only), is one component of a comprehensive MRSA colonization surveillance program. It is not intended to diagnose MRSA infection nor to guide or monitor treatment for MRSA infections. Test performance is not FDA approved in patients less than 41 years old. Performed at [facility], [address], [place], [place] [address]     Time coordinating discharge: 44 mins   SIGNED:  Afton Louder, MD  [facility] 08/14/2023, 11:48 AM How to contact the [facility] Attending or Consulting provider 7A - 7P or covering provider during after hours 7P -7A, for this patient?  Check the care team in [facility] and look for a) attending/consulting TR[facility] provider listed and b) the TRH team listed Log into [email or web address] and use [place]'s universal password to access. If you do not have the password, please contact the hospital  operator. Locate the TR[facility] provider you are looking for under [facility] and page to a number that you can be directly reached. If you still have difficulty reaching the provider, please page the [facility] (Director on Call) for the Hospitalists listed on amion for assistance.

## 2023-08-14 NOTE — [place] FL2 (Signed)
 [place]  MEDICAID FL2 LEVEL OF CARE FORM     IDENTIFICATION  Patient Name: Casey Cooley Birthdate: 1933/07/07 Sex: male Admission Date (Current Location): 08/12/2023  [place] and [place] Number:  [employer] Medicare [id number] Facility and Address:  [facility],  [address], [place] [address]      Provider Number: [id number]  Attending Physician Name and Address:  Vicci Afton CROME, MD  Relative Name and Phone Number:  Jayvier, Burgher Niece   [phone],Cummings,Brenda Sister   [phone]    Current Level of Care: Hospital Recommended Level of Care: Assisted Living Facility [facility] [place] [facility] [place]) Prior Approval Number:    Date Approved/Denied:   PASRR Number:    Discharge Plan: Other (Comment) [facility] [place] [facility] [place])    Current Diagnoses: Patient Active Problem List   Diagnosis Date Noted   RSV (respiratory syncytial virus pneumonia) 08/12/2023   Leg swelling 01/29/2023   Syncope and collapse 01/23/2023   Cellulitis and abscess of leg 01/23/2023   Atrial flutter (HCC) 01/23/2023   Chronic kidney disease, stage 3b (HCC) 01/23/2023   COPD  ? GOLD 0 / mild AB   06/25/2020   Acute respiratory failure with hypoxia (HCC) 08/29/2019   Acute kidney injury superimposed on chronic kidney disease (HCC) 08/29/2019   Chronic non-seasonal allergic rhinitis 08/23/2019   Chronic constipation 08/23/2019   Major depression, recurrent, chronic (HCC) 08/23/2019   Increased intraocular pressure, bilateral 08/23/2019   GERD without esophagitis 08/23/2019   Physical deconditioning    Pressure injury of skin 08/17/2019   Generalized weakness    Pneumonia due to COVID-19 virus 08/13/2019   Facet degeneration of lumbar region 03/28/2018   Hyperlipemia 05/31/2009   Essential hypertension 05/31/2009    Orientation RESPIRATION BLADDER Height & Weight     Self, Time, Situation, Place  Normal Incontinent Weight: 97.9 kg Height:  6' 1 (185.4 cm)   BEHAVIORAL SYMPTOMS/MOOD NEUROLOGICAL BOWEL NUTRITION STATUS      Incontinent Diet (Heart Health/Carb modified)  AMBULATORY STATUS COMMUNICATION OF NEEDS Skin   Supervision   Bruising (Over the body)                       Personal Care Assistance Level of Assistance  Bathing, Feeding, Dressing Bathing Assistance: Maximum assistance Feeding assistance: Independent Dressing Assistance: Limited assistance     Functional Limitations Info  Sight, Hearing, Speech Sight Info: Impaired Hearing Info: Impaired Speech Info: Adequate    SPECIAL CARE FACTORS FREQUENCY                       Contractures Contractures Info: Not present    Additional Factors Info  Code Status, Allergies Code Status Info: FULL Allergies Info: No Known Allergies           Current Medications (08/14/2023):  This is the current hospital active medication list Current Facility-Administered Medications  Medication Dose Route Frequency Provider Last Rate Last Admin   acetaminophen  (TYLENOL ) tablet 650 mg  650 mg Oral Q6H PRN Elgergawy, Dawood S, MD       Or   acetaminophen  (TYLENOL ) suppository 650 mg  650 mg Rectal Q6H PRN Elgergawy, Dawood S, MD       albuterol  (PROVENTIL ) (2.5 MG/3ML) 0.083% nebulizer solution 2.5 mg  2.5 mg Nebulization Q2H PRN Elgergawy, Dawood S, MD       apixaban  (ELIQUIS ) tablet 2.5 mg  2.5 mg Oral BID Elgergawy, Dawood S, MD   2.5 mg at 08/14/23  9051   azithromycin  (ZITHROMAX ) 500 mg in sodium chloride  0.9 % 250 mL IVPB  500 mg Intravenous Q24H Elgergawy, Dawood S, MD 250 mL/hr at 08/13/23 1821 Infusion Verify at 08/13/23 1821   calcium  carbonate (OS-CAL - dosed in mg of elemental calcium ) tablet 1,250 mg  1 tablet Oral Q breakfast Elgergawy, Dawood S, MD   1,250 mg at 08/14/23 0947   cefTRIAXone  (ROCEPHIN ) 2 g in sodium chloride  0.9 % 100 mL IVPB  2 g Intravenous Q24H Elgergawy, Dawood S, MD 200 mL/hr at 08/13/23 1852 2 g at 08/13/23 1852   cholecalciferol  (VITAMIN D3) 25  MCG (1000 UNIT) tablet 2,000 Units  2,000 Units Oral Daily Elgergawy, Dawood S, MD   2,000 Units at 08/14/23 9051   furosemide  (LASIX ) tablet 40 mg  40 mg Oral Daily Johnson, Clanford L, MD   40 mg at 08/14/23 9052   guaiFENesin  (MUCINEX ) 12 hr tablet 600 mg  600 mg Oral BID Elgergawy, Dawood S, MD   600 mg at 08/14/23 9051   hydrALAZINE  (APRESOLINE ) injection 5 mg  5 mg Intravenous Q4H PRN Elgergawy, Dawood S, MD       insulin  aspart (novoLOG ) injection 0-15 Units  0-15 Units Subcutaneous TID WC Elgergawy, Dawood S, MD   2 Units at 08/14/23 0946   insulin  aspart (novoLOG ) injection 0-5 Units  0-5 Units Subcutaneous QHS Elgergawy, Brayton RAMAN, MD       ipratropium-albuterol  (DUONEB) 0.5-2.5 (3) MG/3ML nebulizer solution 3 mL  3 mL Nebulization TID Johnson, Clanford L, MD   3 mL at 08/14/23 9093   methylPREDNISolone  sodium succinate (SOLU-MEDROL ) 40 mg/mL injection 40 mg  40 mg Intravenous Q12H Johnson, Clanford L, MD   40 mg at 08/14/23 0948   mirtazapine  (REMERON ) tablet 15 mg  15 mg Oral QHS Elgergawy, Dawood S, MD   15 mg at 08/13/23 2127   mometasone -formoterol  (DULERA ) 200-5 MCG/ACT inhaler 2 puff  2 puff Inhalation BID Elgergawy, Dawood S, MD   2 puff at 08/14/23 0912   multivitamin with minerals tablet 1 tablet  1 tablet Oral Daily Elgergawy, Dawood S, MD   1 tablet at 08/14/23 0948   nicotine  (NICODERM CQ  - dosed in mg/24 hours) patch 21 mg  21 mg Transdermal Daily Elgergawy, Dawood S, MD   21 mg at 08/14/23 0946   ondansetron  (ZOFRAN ) tablet 4 mg  4 mg Oral Q6H PRN Elgergawy, Dawood S, MD       Or   ondansetron  (ZOFRAN ) injection 4 mg  4 mg Intravenous Q6H PRN Elgergawy, Dawood S, MD       pantoprazole  (PROTONIX ) EC tablet 40 mg  40 mg Oral Daily Elgergawy, Dawood S, MD   40 mg at 08/14/23 0948   polyethylene glycol (MIRALAX  / GLYCOLAX ) packet 17 g  17 g Oral Daily PRN Elgergawy, Dawood S, MD       simvastatin  (ZOCOR ) tablet 20 mg  20 mg Oral q1800 Elgergawy, Dawood S, MD   20 mg at 08/13/23  1750   tamsulosin  (FLOMAX ) capsule 0.4 mg  0.4 mg Oral Daily Elgergawy, Dawood S, MD   0.4 mg at 08/14/23 9052   vitamin B-12 (CYANOCOBALAMIN ) tablet 500 mcg  500 mcg Oral Daily Elgergawy, Dawood S, MD   500 mcg at 08/14/23 0947     Discharge Medications: Please see discharge summary for a list of discharge medications.  Relevant Imaging Results:  Relevant Lab Results:   Additional Information SS#[id number]  Roise Falcon, RN

## 2023-08-14 NOTE — Progress Notes (Signed)
 RN called to let RT know that patient had ripped BIPAP mask off and didn't want to wear any longer. She placed patient back on 2 lpm nasal cannula.

## 2023-08-14 NOTE — Discharge Instructions (Signed)
IMPORTANT INFORMATION: PAY CLOSE ATTENTION   PHYSICIAN DISCHARGE INSTRUCTIONS  Follow with Primary care provider  Hague, Myrene Galas, MD  and other consultants as instructed by your Hospitalist Physician  SEEK MEDICAL CARE OR RETURN TO EMERGENCY ROOM IF SYMPTOMS COME BACK, WORSEN OR NEW PROBLEM DEVELOPS   Please note: You were cared for by a hospitalist during your hospital stay. Every effort will be made to forward records to your primary care provider.  You can request that your primary care provider send for your hospital records if they have not received them.  Once you are discharged, your primary care physician will handle any further medical issues. Please note that NO REFILLS for any discharge medications will be authorized once you are discharged, as it is imperative that you return to your primary care physician (or establish a relationship with a primary care physician if you do not have one) for your post hospital discharge needs so that they can reassess your need for medications and monitor your lab values.  Please get a complete blood count and chemistry panel checked by your Primary MD at your next visit, and again as instructed by your Primary MD.  Get Medicines reviewed and adjusted: Please take all your medications with you for your next visit with your Primary MD  Laboratory/radiological data: Please request your Primary MD to go over all hospital tests and procedure/radiological results at the follow up, please ask your primary care provider to get all Hospital records sent to his/her office.  In some cases, they will be blood work, cultures and biopsy results pending at the time of your discharge. Please request that your primary care provider follow up on these results.  If you are diabetic, please bring your blood sugar readings with you to your follow up appointment with primary care.    Please call and make your follow up appointments as soon as possible.    Also Note  the following: If you experience worsening of your admission symptoms, develop shortness of breath, life threatening emergency, suicidal or homicidal thoughts you must seek medical attention immediately by calling 911 or calling your MD immediately  if symptoms less severe.  You must read complete instructions/literature along with all the possible adverse reactions/side effects for all the Medicines you take and that have been prescribed to you. Take any new Medicines after you have completely understood and accpet all the possible adverse reactions/side effects.   Do not drive when taking Pain medications or sleeping medications (Benzodiazepines)  Do not take more than prescribed Pain, Sleep and Anxiety Medications. It is not advisable to combine anxiety,sleep and pain medications without talking with your primary care practitioner  Special Instructions: If you have smoked or chewed Tobacco  in the last 2 yrs please stop smoking, stop any regular Alcohol  and or any Recreational drug use.  Wear Seat belts while driving.  Do not drive if taking any narcotic, mind altering or controlled substances or recreational drugs or alcohol.

## 2023-08-14 NOTE — Plan of Care (Signed)

## 2023-08-15 LAB — LEGIONELLA PNEUMOPHILA SEROGP 1 UR AG: L. pneumophila Serogp 1 Ur Ag: NEGATIVE

## 2023-08-16 LAB — CULTURE, BLOOD (ROUTINE X 2)

## 2023-08-17 LAB — CULTURE, BLOOD (ROUTINE X 2): Culture: NO GROWTH

## 2023-09-18 ENCOUNTER — Inpatient Hospital Stay (HOSPITAL_COMMUNITY)
Admission: EM | Admit: 2023-09-18 | Discharge: 2023-09-22 | DRG: 389 | Disposition: A | Discharge status: Nursing Home (Includes ICF) | Payer: Medicare Other | Source: Skilled Nursing Facility | Attending: Internal Medicine | Admitting: Internal Medicine

## 2023-09-18 ENCOUNTER — Emergency Department (HOSPITAL_COMMUNITY): Payer: Medicare Other

## 2023-09-18 ENCOUNTER — Other Ambulatory Visit: Payer: Self-pay

## 2023-09-18 DIAGNOSIS — Z515 Encounter for palliative care: Secondary | ICD-10-CM | POA: Diagnosis not present

## 2023-09-18 DIAGNOSIS — Z7189 Other specified counseling: Secondary | ICD-10-CM | POA: Diagnosis not present

## 2023-09-18 DIAGNOSIS — D539 Nutritional anemia, unspecified: Secondary | ICD-10-CM | POA: Diagnosis present

## 2023-09-18 DIAGNOSIS — Z66 Do not resuscitate: Secondary | ICD-10-CM | POA: Diagnosis present

## 2023-09-18 DIAGNOSIS — E782 Mixed hyperlipidemia: Secondary | ICD-10-CM | POA: Diagnosis present

## 2023-09-18 DIAGNOSIS — E119 Type 2 diabetes mellitus without complications: Secondary | ICD-10-CM

## 2023-09-18 DIAGNOSIS — R1111 Vomiting without nausea: Secondary | ICD-10-CM

## 2023-09-18 DIAGNOSIS — K56600 Partial intestinal obstruction, unspecified as to cause: Principal | ICD-10-CM | POA: Diagnosis present

## 2023-09-18 DIAGNOSIS — E8809 Other disorders of plasma-protein metabolism, not elsewhere classified: Secondary | ICD-10-CM | POA: Insufficient documentation

## 2023-09-18 DIAGNOSIS — I10 Essential (primary) hypertension: Secondary | ICD-10-CM | POA: Diagnosis not present

## 2023-09-18 DIAGNOSIS — Z7901 Long term (current) use of anticoagulants: Secondary | ICD-10-CM | POA: Diagnosis not present

## 2023-09-18 DIAGNOSIS — K429 Umbilical hernia without obstruction or gangrene: Secondary | ICD-10-CM | POA: Diagnosis present

## 2023-09-18 DIAGNOSIS — R54 Age-related physical debility: Secondary | ICD-10-CM | POA: Diagnosis present

## 2023-09-18 DIAGNOSIS — I5032 Chronic diastolic (congestive) heart failure: Secondary | ICD-10-CM | POA: Insufficient documentation

## 2023-09-18 DIAGNOSIS — F32A Depression, unspecified: Secondary | ICD-10-CM | POA: Diagnosis present

## 2023-09-18 DIAGNOSIS — J449 Chronic obstructive pulmonary disease, unspecified: Secondary | ICD-10-CM | POA: Diagnosis present

## 2023-09-18 DIAGNOSIS — K56609 Unspecified intestinal obstruction, unspecified as to partial versus complete obstruction: Secondary | ICD-10-CM | POA: Diagnosis not present

## 2023-09-18 DIAGNOSIS — I130 Hypertensive heart and chronic kidney disease with heart failure and stage 1 through stage 4 chronic kidney disease, or unspecified chronic kidney disease: Secondary | ICD-10-CM | POA: Diagnosis present

## 2023-09-18 DIAGNOSIS — E860 Dehydration: Secondary | ICD-10-CM | POA: Diagnosis present

## 2023-09-18 DIAGNOSIS — J9811 Atelectasis: Secondary | ICD-10-CM | POA: Diagnosis present

## 2023-09-18 DIAGNOSIS — N1832 Chronic kidney disease, stage 3b: Secondary | ICD-10-CM | POA: Diagnosis present

## 2023-09-18 DIAGNOSIS — F17210 Nicotine dependence, cigarettes, uncomplicated: Secondary | ICD-10-CM | POA: Diagnosis present

## 2023-09-18 DIAGNOSIS — Z1152 Encounter for screening for COVID-19: Secondary | ICD-10-CM

## 2023-09-18 DIAGNOSIS — E441 Mild protein-calorie malnutrition: Secondary | ICD-10-CM | POA: Diagnosis present

## 2023-09-18 DIAGNOSIS — Z79899 Other long term (current) drug therapy: Secondary | ICD-10-CM | POA: Diagnosis not present

## 2023-09-18 DIAGNOSIS — Z7951 Long term (current) use of inhaled steroids: Secondary | ICD-10-CM | POA: Diagnosis not present

## 2023-09-18 DIAGNOSIS — E46 Unspecified protein-calorie malnutrition: Secondary | ICD-10-CM | POA: Insufficient documentation

## 2023-09-18 DIAGNOSIS — R1110 Vomiting, unspecified: Secondary | ICD-10-CM | POA: Insufficient documentation

## 2023-09-18 DIAGNOSIS — Z6828 Body mass index (BMI) 28.0-28.9, adult: Secondary | ICD-10-CM

## 2023-09-18 DIAGNOSIS — Z604 Social exclusion and rejection: Secondary | ICD-10-CM | POA: Diagnosis present

## 2023-09-18 DIAGNOSIS — E1122 Type 2 diabetes mellitus with diabetic chronic kidney disease: Secondary | ICD-10-CM | POA: Diagnosis present

## 2023-09-18 DIAGNOSIS — F17200 Nicotine dependence, unspecified, uncomplicated: Secondary | ICD-10-CM | POA: Insufficient documentation

## 2023-09-18 DIAGNOSIS — N400 Enlarged prostate without lower urinary tract symptoms: Secondary | ICD-10-CM | POA: Diagnosis present

## 2023-09-18 DIAGNOSIS — I4892 Unspecified atrial flutter: Secondary | ICD-10-CM | POA: Diagnosis present

## 2023-09-18 DIAGNOSIS — R0902 Hypoxemia: Secondary | ICD-10-CM | POA: Diagnosis present

## 2023-09-18 DIAGNOSIS — K219 Gastro-esophageal reflux disease without esophagitis: Secondary | ICD-10-CM | POA: Diagnosis present

## 2023-09-18 DIAGNOSIS — K920 Hematemesis: Secondary | ICD-10-CM | POA: Diagnosis present

## 2023-09-18 LAB — RESP PANEL BY RT-PCR (RSV, FLU A&B, COVID)  RVPGX2
Influenza A by PCR: NEGATIVE
Influenza B by PCR: NEGATIVE
Resp Syncytial Virus by PCR: NEGATIVE
SARS Coronavirus 2 by RT PCR: NEGATIVE

## 2023-09-18 LAB — CBC WITH DIFFERENTIAL/PLATELET
Abs Immature Granulocytes: 0.04 10*3/uL (ref 0.00–0.07)
Basophils Absolute: 0 10*3/uL (ref 0.0–0.1)
Basophils Relative: 0 %
Eosinophils Absolute: 0 10*3/uL (ref 0.0–0.5)
Eosinophils Relative: 0 %
HCT: 40.8 % (ref 39.0–52.0)
Hemoglobin: 12.6 g/dL — ABNORMAL LOW (ref 13.0–17.0)
Immature Granulocytes: 1 %
Lymphocytes Relative: 11 %
Lymphs Abs: 0.9 10*3/uL (ref 0.7–4.0)
MCH: 31.6 pg (ref 26.0–34.0)
MCHC: 30.9 g/dL (ref 30.0–36.0)
MCV: 102.3 fL — ABNORMAL HIGH (ref 80.0–100.0)
Monocytes Absolute: 0.8 10*3/uL (ref 0.1–1.0)
Monocytes Relative: 11 %
Neutro Abs: 6 10*3/uL (ref 1.7–7.7)
Neutrophils Relative %: 77 %
Platelets: 180 10*3/uL (ref 150–400)
RBC: 3.99 MIL/uL — ABNORMAL LOW (ref 4.22–5.81)
RDW: 13.2 % (ref 11.5–15.5)
WBC: 7.7 10*3/uL (ref 4.0–10.5)
nRBC: 0 % (ref 0.0–0.2)

## 2023-09-18 LAB — COMPREHENSIVE METABOLIC PANEL
ALT: 16 U/L (ref 0–44)
AST: 24 U/L (ref 15–41)
Albumin: 3.1 g/dL — ABNORMAL LOW (ref 3.5–5.0)
Alkaline Phosphatase: 70 U/L (ref 38–126)
Anion gap: 11 (ref 5–15)
BUN: 33 mg/dL — ABNORMAL HIGH (ref 8–23)
CO2: 28 mmol/L (ref 22–32)
Calcium: 8.8 mg/dL — ABNORMAL LOW (ref 8.9–10.3)
Chloride: 101 mmol/L (ref 98–111)
Creatinine, Ser: 1.75 mg/dL — ABNORMAL HIGH (ref 0.61–1.24)
GFR, Estimated: 37 mL/min — ABNORMAL LOW (ref >60)
Glucose, Bld: 127 mg/dL — ABNORMAL HIGH (ref 70–99)
Potassium: 4.2 mmol/L (ref 3.5–5.1)
Sodium: 140 mmol/L (ref 135–145)
Total Bilirubin: 0.6 mg/dL (ref 0.0–1.2)
Total Protein: 6.8 g/dL (ref 6.5–8.1)

## 2023-09-18 LAB — TYPE AND SCREEN
ABO/RH(D): A POS
Antibody Screen: NEGATIVE

## 2023-09-18 LAB — CBG MONITORING, ED: Glucose-Capillary: 107 mg/dL — ABNORMAL HIGH (ref 70–99)

## 2023-09-18 MED ORDER — PANTOPRAZOLE SODIUM 40 MG IV SOLR
40.0000 mg | INTRAVENOUS | Status: DC
  Administered 2023-09-18 – 2023-09-21 (×4): 40 mg via INTRAVENOUS
  Filled 2023-09-18 (×4): qty 10

## 2023-09-18 MED ORDER — ACETAMINOPHEN 325 MG PO TABS
650.0000 mg | ORAL_TABLET | Freq: Four times a day (QID) | ORAL | Status: DC | PRN
  Administered 2023-09-22: 650 mg via ORAL
  Filled 2023-09-18: qty 2

## 2023-09-18 MED ORDER — ONDANSETRON HCL 4 MG PO TABS: 4.0000 mg | ORAL_TABLET | Freq: Four times a day (QID) | ORAL | Status: DC | PRN

## 2023-09-18 MED ORDER — IOHEXOL 300 MG/ML  SOLN
75.0000 mL | Freq: Once | INTRAMUSCULAR | Status: AC | PRN
  Administered 2023-09-18: 75 mL via INTRAVENOUS

## 2023-09-18 MED ORDER — INSULIN ASPART 100 UNIT/ML IJ SOLN
0.0000 [IU] | INTRAMUSCULAR | Status: DC
  Administered 2023-09-20 – 2023-09-21 (×3): 1 [IU] via SUBCUTANEOUS
  Administered 2023-09-22: 2 [IU] via SUBCUTANEOUS

## 2023-09-18 MED ORDER — ONDANSETRON HCL 4 MG/2ML IJ SOLN
4.0000 mg | Freq: Four times a day (QID) | INTRAMUSCULAR | Status: DC | PRN
Start: 2023-09-18 — End: 2023-09-22

## 2023-09-18 MED ORDER — ACETAMINOPHEN 650 MG RE SUPP: 650.0000 mg | Freq: Four times a day (QID) | RECTAL | Status: DC | PRN

## 2023-09-18 MED ORDER — ONDANSETRON HCL 4 MG/2ML IJ SOLN
4.0000 mg | Freq: Once | INTRAMUSCULAR | Status: AC
  Administered 2023-09-18: 4 mg via INTRAVENOUS
  Filled 2023-09-18: qty 2

## 2023-09-18 MED ORDER — PANTOPRAZOLE SODIUM 40 MG IV SOLR
40.0000 mg | Freq: Once | INTRAVENOUS | Status: AC
  Administered 2023-09-18: 40 mg via INTRAVENOUS
  Filled 2023-09-18: qty 10

## 2023-09-18 MED ORDER — MORPHINE SULFATE (PF) 2 MG/ML IV SOLN
2.0000 mg | INTRAVENOUS | Status: DC | PRN
  Administered 2023-09-19: 2 mg via INTRAVENOUS
  Filled 2023-09-18: qty 1

## 2023-09-18 MED ORDER — SODIUM CHLORIDE 0.9 % IV SOLN
INTRAVENOUS | Status: DC
Start: 2023-09-18 — End: 2023-09-19

## 2023-09-18 MED ORDER — SODIUM CHLORIDE 0.9 % IV BOLUS
1000.0000 mL | Freq: Once | INTRAVENOUS | Status: AC
  Administered 2023-09-18: 1000 mL via INTRAVENOUS

## 2023-09-18 NOTE — ED Notes (Signed)
 Niece Alston Jerry called and gave her update.

## 2023-09-18 NOTE — H&P (Signed)
 History and Physical    Patient: Casey Cooley DOB: [birth year] DOA: 09/18/2023 DOS: the patient was seen and examined on 09/18/2023 PCP: [name] SQUIBB, MD  Patient coming from: SNF  Chief Complaint:  Chief Complaint  Patient presents with   Emesis   HPI: Casey Cooley is a 89 y.o. male with medical history significant of hypertension, hyperlipidemia, COPD, CKD stage III, GERD, T2DM, atrial flutter on Eliquis  who presents to the emergency department from [facility] via EMS due to vomiting of coffee-ground emesis with shortness of breath this morning.  Patient was unable to provide history, history was obtained from ED PA and ED medical record.  Per report, Patient was reported to have RSV about a month ago by the nursing staff and patient's memory and mobility was reported to have been declining since then.  He had an episode of coffee-ground emesis this morning.  At baseline, patient reported to be difficult to understand sometimes, but he has had normal appetite and has been drinking fluids well.  There was no report of fever or any recent fall.  EMS was activated.  And on arrival of EMS team, patient was noted to be hypoxic at 86% on room air on arrival to the ED and was placed on supplemental oxygen via [place] at 2 LPM with improvement in O2 sat to mid 90s (patient does not use oxygen at baseline).  ED Course:  In the emergency department, patient was hemodynamically stable, he was already weaned off supplemental oxygen and patient still maintaining an O2 sats of 94 to 97%.  Workup in the ED showed macrocytic anemia.  BMP was normal except for blood glucose of 127, BUN/creatinine 33/1.75 (creatinine is within baseline range).  Albumin 3.1.  Influenza A, B, SARS, respiratory, RSV was negative. CT head without contrast showed no acute intracranial process CT abdomen and pelvis with contrast showed interval  partial small bowel obstruction with the transition point in the lower  abdomen to the left of midline. This is most likely due to adhesion formation. Chest x-ray showed low lung volumes with bibasilar atelectasis/scarring IV hydration was provided, IV Protonix  40 mg x 1 was given, Zofran  was given.  General surgery (Dr. Kallie) was consulted and recommended admitting the patient with plan to see patient in the morning. Hospitalist was asked to admit patient for further evaluation and management.  Review of Systems: Review of systems as noted in the HPI. All other systems reviewed and are negative.   Past Medical History:  Diagnosis Date   BPH (benign prostatic hyperplasia)    per MAR   CKD (chronic kidney disease)    COPD (chronic obstructive pulmonary disease) (HCC)    Depression    Diabetes mellitus without complication (HCC)    GERD (gastroesophageal reflux disease)    Hypercholesterolemia    Hyperlipidemia    Hypertension    RETINAL DETACHMENT, BILATERAL, HX OF 05/31/2009   Qualifier: Diagnosis of  By: Winona Raring     Past Surgical History:  Procedure Laterality Date   EYE SURGERY     FOOT SURGERY      Social History:  reports that he has been smoking cigarettes. He has never used smokeless tobacco. He reports that he does not currently use alcohol. He reports that he does not use drugs.   No Known Allergies  No family history on file.   Prior to Admission medications   Medication Sig Start Date End Date Taking? Authorizing Provider  acetaminophen  (TYLENOL )  325 MG tablet Take 650 mg by mouth 2 (two) times daily.   Yes [provider]  apixaban  (ELIQUIS ) 2.5 MG TABS tablet Take 1 tablet (2.5 mg total) by mouth 2 (two) times daily. 08/14/23  Yes Johnson, Clanford L, MD  benzocaine (ORAJEL) 10 % mucosal gel Use as directed 1 Application in the mouth or throat 3 (three) times daily. Before each meal.   Yes [provider]  budesonide-formoterol  (SYMBICORT) 160-4.5 MCG/ACT inhaler Inhale 2 puffs into the lungs 2 (two) times  daily.   Yes [provider]  calcium  carbonate (OSCAL) 1500 (600 Ca) MG TABS tablet Take 600 mg of elemental calcium  by mouth daily.   Yes [provider]  cholecalciferol  (VITAMIN D3) 25 MCG (1000 UT) tablet Take 2,000 Units by mouth daily.   Yes [provider]  dextromethorphan  (DELSYM ) 30 MG/5ML liquid Take 5 mLs (30 mg total) by mouth 2 (two) times daily as needed for cough. 08/14/23  Yes Johnson, Clanford L, MD  docusate sodium  (COLACE) 100 MG capsule Take 100 mg by mouth daily.   Yes [provider]  escitalopram  (LEXAPRO ) 10 MG tablet Take 1 tablet (10 mg total) by mouth daily. Patient taking differently: Take 5 mg by mouth daily. 09/07/19  Yes Marsa Arlean BIRCH, MD  fluticasone  (FLONASE ) 50 MCG/ACT nasal spray Place 2 sprays into both nostrils daily as needed for allergies or rhinitis. 09/07/19  Yes Marsa Arlean BIRCH, MD  furosemide  (LASIX ) 40 MG tablet Take 40 mg by mouth daily.   Yes [provider]  ipratropium-albuterol  (DUONEB) 0.5-2.5 (3) MG/3ML SOLN Take 3 mLs by nebulization every 4 (four) hours as needed (shortness of breath, cough, wheezing). 08/14/23  Yes Johnson, Clanford L, MD  lidocaine 4 % Place 1 patch onto the skin daily. Apply 1 patch to mid lower back every day for 12 hours on and 12 hours off.   Yes [provider]  loperamide (IMODIUM) 2 MG capsule Take 2 mg by mouth as needed for diarrhea or loose stools.   Yes [provider]  LORazepam  (ATIVAN ) 0.5 MG tablet Take 0.25 mg by mouth 2 (two) times daily. 08/21/23  Yes [provider]  Melatonin 10 MG TABS Take 10 mg by mouth at bedtime.   Yes [provider]  Multiple Vitamin (MULTIVITAMIN WITH MINERALS) TABS tablet Take 1 tablet by mouth daily.   Yes [provider]  Omeprazole -Sodium Bicarbonate  (ZEGERID  OTC) 20-1100 MG CAPS capsule Take 1 capsule by mouth daily before breakfast. 09/07/19  Yes Marsa Arlean BIRCH, MD  ondansetron  (ZOFRAN ) 4  MG tablet Take 4 mg by mouth every 8 (eight) hours as needed for nausea or vomiting.   Yes [provider]  polyethylene glycol (MIRALAX  / GLYCOLAX ) 17 g packet Take 17 g by mouth daily as needed for moderate constipation.   Yes [provider]  simvastatin  (ZOCOR ) 20 MG tablet Take 1 tablet (20 mg total) by mouth daily at 6 PM. 09/07/19  Yes Marsa Arlean BIRCH, MD  sodium chloride  (MURO 128) 5 % ophthalmic solution Place 1 drop into both eyes 2 (two) times daily.   Yes [provider]  tamsulosin  (FLOMAX ) 0.4 MG CAPS capsule Take 0.4 mg by mouth daily.   Yes [provider]  zinc  oxide 20 % ointment Apply 1 Application topically daily as needed (Redness).   Yes [provider]    Physical Exam: BP 112/71   Pulse 87   Temp 97.7 F (36.5 C) (Axillary)  Resp 20   SpO2 93%   General: 89 y.o. year-old male ill appearing, but in no acute distress.  Alert and oriented x3. HEENT: NCAT, EOMI, dry mucous membrane Neck: Supple, trachea medial Cardiovascular: Regular rate and rhythm with no rubs or gallops.  No thyromegaly or JVD noted.  Bilateral +2 pedal edema. 2/4 pulses in all 4 extremities. Respiratory: Clear to auscultation with no wheezes or rales. Good inspiratory effort. Abdomen: Soft, nontender nondistended with normal bowel sounds x4 quadrants. Muskuloskeletal: No cyanosis, clubbing noted bilaterally Neuro: CN II-XII intact, strength 5/5 x 4, sensation, reflexes intact Skin: Decreased skin turgor.  No ulcerative lesions noted or rashes Psychiatry: Mood is appropriate for condition and setting          Labs on Admission:  Basic Metabolic Panel: Recent Labs  Lab 09/18/23 0925  NA 140  K 4.2  CL 101  CO2 28  GLUCOSE 127*  BUN 33*  CREATININE 1.75*  CALCIUM  8.8*   Liver Function Tests: Recent Labs  Lab 09/18/23 0925  AST 24  ALT 16  ALKPHOS 70  BILITOT 0.6  PROT 6.8  ALBUMIN 3.1*   No results for input(s): LIPASE,  AMYLASE in the last 168 hours. No results for input(s): AMMONIA in the last 168 hours. CBC: Recent Labs  Lab 09/18/23 0925  WBC 7.7  NEUTROABS 6.0  HGB 12.6*  HCT 40.8  MCV 102.3*  PLT 180   Cardiac Enzymes: No results for input(s): CKTOTAL, CKMB, CKMBINDEX, TROPONINI in the last 168 hours.  BNP (last 3 results) Recent Labs    01/23/23 1055 08/12/23 1424  BNP 68.0 82.0    ProBNP (last 3 results) No results for input(s): PROBNP in the last 8760 hours.  CBG: No results for input(s): GLUCAP in the last 168 hours.  Radiological Exams on Admission: CT Head Wo Contrast Result Date: 09/18/2023 CLINICAL DATA:  Coffee-ground emesis, altered mental status EXAM: CT HEAD WITHOUT CONTRAST TECHNIQUE: Contiguous axial images were obtained from the base of the skull through the vertex without intravenous contrast. RADIATION DOSE REDUCTION: This exam was performed according to the departmental dose-optimization program which includes automated exposure control, adjustment of the mA and/or kV according to patient size and/or use of iterative reconstruction technique. COMPARISON:  08/12/2023 age related cerebral atrophy. FINDINGS: Brain: No evidence of acute infarction, hemorrhage, mass, mass effect, or midline shift. No hydrocephalus or extra-axial fluid collection. Periventricular white matter changes, likely the sequela of chronic small vessel ischemic disease. Vascular: No hyperdense vessel. Skull: Negative for fracture or focal lesion. Sinuses/Orbits: Mucosal thickening throughout the paranasal sinuses. No acute finding in the orbits. Postsurgical changes in the globes. Other: The mastoid air cells are well aerated. IMPRESSION: No acute intracranial process. Electronically Signed   By: Donald Campion M.D.   On: 09/18/2023 18:49   CT ABDOMEN PELVIS W CONTRAST Result Date: 09/18/2023 CLINICAL DATA:  Coffee-ground emesis raising a concern for gastrointestinal bleeding. Abdominal  distention. The patient reports no abdominal pain at this time. EXAM: CT ABDOMEN AND PELVIS WITH CONTRAST TECHNIQUE: Multidetector CT imaging of the abdomen and pelvis was performed using the standard protocol following bolus administration of intravenous contrast. RADIATION DOSE REDUCTION: This exam was performed according to the departmental dose-optimization program which includes automated exposure control, adjustment of the mA and/or kV according to patient size and/or use of iterative reconstruction technique. CONTRAST:  75mL OMNIPAQUE  IOHEXOL  300 MG/ML  SOLN COMPARISON:  01/23/2023 FINDINGS: Lower chest: Stable mildly enlarged heart. Atheromatous calcifications, including the coronary  arteries and aorta. Mild-to-moderate dependent atelectasis at both lung bases. Hepatobiliary: No focal liver abnormality is seen. No gallstones, gallbladder wall thickening, or biliary dilatation. Pancreas: Moderate to marked diffuse pancreatic atrophy. Spleen: Normal in size without focal abnormality. Adrenals/Urinary Tract: Adrenal glands are unremarkable. Kidneys are normal, without renal calculi, focal lesion, or hydronephrosis. Bladder is unremarkable. Stomach/Bowel: Interval mildly dilated gas and fluid-filled proximal and mid small bowel with normal caliber distal small bowel. The transition point appears to be in the lower abdomen to the left of midline with no obstructing mass seen. The previously demonstrated small bowel loop within a small umbilical hernia containing fat is no longer in the hernia. The hernia only contains fat today. The stomach is distended. Normal caliber colon with the exception of interval mild dilatation of the rectum and distal sigmoid colon. Stool and fluid in both. The appendix is not identified. No secondary signs of appendicitis. Vascular/Lymphatic: Atheromatous arterial calcifications without aneurysm. No enlarged lymph nodes. Reproductive: Prostate is unremarkable. Other: Small umbilical  and bilateral inguinal hernias containing fat. Musculoskeletal: Lumbar and lower thoracic spine degenerative changes with changes of DISH. Multiple lumbar and lower thoracic spine compression deformities without significant change. No new fractures or bony retropulsion. IMPRESSION: 1. Interval partial small bowel obstruction with the transition point in the lower abdomen to the left of midline. This is most likely due to adhesion formation. 2. Interval mild dilatation of the rectum and distal sigmoid colon with stool and fluid in both. 3. Stable mild cardiomegaly. 4.  Calcific coronary artery and aortic atherosclerosis. Aortic Atherosclerosis (ICD10-I70.0). Electronically Signed   By: Elspeth Bathe M.D.   On: 09/18/2023 16:47   DG Chest Port 1 View Result Date: 09/18/2023 CLINICAL DATA:  Shortness of breath and coffee-ground emesis EXAM: PORTABLE CHEST 1 VIEW COMPARISON:  08/14/2023 FINDINGS: Low lung volumes accentuate cardiomediastinal silhouette and pulmonary vascularity. Aortic atherosclerotic calcification. Bibasilar atelectasis/scarring. The lungs are otherwise clear. No pleural effusion or pneumothorax. IMPRESSION: Low lung volumes with bibasilar atelectasis/scarring. Electronically Signed   By: Norman Gatlin M.D.   On: 09/18/2023 09:19    EKG: I independently viewed the EKG done and my findings are as followed: Atrial flutter with varied AV block at a rate of 90 bpm  Assessment/Plan Present on Admission:  Small bowel obstruction (HCC)  Chronic kidney disease, stage 3b (HCC)  Atrial flutter (HCC)  Essential hypertension  Mixed hyperlipidemia  GERD without esophagitis  COPD (chronic obstructive pulmonary disease) (HCC)  Principal Problem:   Small bowel obstruction (HCC) Active Problems:   Mixed hyperlipidemia   Essential hypertension   GERD without esophagitis   COPD (chronic obstructive pulmonary disease) (HCC)   Atrial flutter (HCC)   Chronic kidney disease, stage 3b (HCC)    Vomiting   Macrocytic anemia   Hypoalbuminemia due to protein-calorie malnutrition (HCC)   Diabetes mellitus type 2 in nonobese (HCC)   Chronic diastolic CHF (congestive heart failure) (HCC)   Tobacco use disorder  Small bowel obstruction No indication for NG tube at this time Continue NPO at this time with plan to advance diet as tolerated Continue IV hydration Continue IV morphine  2 mg q.4h p.r.n. for moderate/severe pain Continue IV Protonix  40 mg daily Continue Zofran  p.r.n. for nausea/vomiting General Surgery (Dr. Kallie) was consulted and will see patient in the morning  Vomiting Continue Zofran  as needed  Macrocytic anemia MCV 102.3, hemoglobin 12.6 Vitamin B12 and folate levels will be checked  CKD 3B Dehydration BUN/creatinine 33/1.75 (creatinine is within baseline range).  Renally adjust medications, avoid nephrotoxic agents/dehydration/hypotension  Hypoalbuminemia possibly due to mild protein calorie malnutrition Albumin 3.1, consider protein supplementation when SBO resolves  A flutter Rate is currently controlled, not on any AV blocking agents Apixaban  will be temporarily held pending surgical evaluation pneumonia    Essential hypertension (controlled) No BP meds noted on med rec  Mixed hyperlipidemia Consider home statin when patient resumes oral intake  Diabetes mellitus, type II A1c 6.2% on 08/12/2023 Continue ISS and hypoglycemia protocol  GERD Continue Protonix   Chronic diastolic CHF Chronic lower extremity edema Most recent echo June 2024 with a preserved EF - 60-65% Lasix  temporarily held due to dehydration   Tobacco use disorder Still smoking cigar and pipe intermittently in the facility, continue nicotine  patch Patient was counseled on tobacco abuse cessation    DVT prophylaxis: SCDs  Code Status: DNR  Family Communication: None at bedside  Consults: General Surgery   Severity of Illness: The appropriate patient status for this  patient is INPATIENT. Inpatient status is judged to be reasonable and necessary in order to provide the required intensity of service to ensure the patient's safety. The patient's presenting symptoms, physical exam findings, and initial radiographic and laboratory data in the context of their chronic comorbidities is felt to place them at high risk for further clinical deterioration. Furthermore, it is not anticipated that the patient will be medically stable for discharge from the hospital within 2 midnights of admission.   * I certify that at the point of admission it is my clinical judgment that the patient will require inpatient hospital care spanning beyond 2 midnights from the point of admission due to high intensity of service, high risk for further deterioration and high frequency of surveillance required.*  Author: Lilburn Straw, DO 09/18/2023 9:01 PM  For on call review [email or web address].

## 2023-09-18 NOTE — ED Provider Notes (Signed)
 [place] EMERGENCY DEPARTMENT AT [facility] Provider Note   CSN: [id number] Arrival date & time: 09/18/23  9160     History  Chief Complaint  Patient presents with   Emesis    Casey Cooley is a 89 y.o. male.   Emesis      Casey Cooley is a 89 y.o. male past medical history of atrial flutter, (anticoagulated) hypertension, GERD, COPD, type 2 diabetes CKD, resides at [facility] assisted [facility] facility.   Brought in by EMS for evaluation of vomiting and coffee-ground emesis with shortness of breath.  Patient was noted to be hypoxic at 86% O2 on room air upon arrival.  He was placed on 2 L O2 by nasal cannula.  Oxygen saturations currently in the mid 90s.He is not on supplemental oxygen at baseline.     Patient moans when attempts to answer questions.  Unable to provide any relevant medical history.  Patient is DNR  Spoke with nursing home staff.  I was informed that patient had RSV a month ago and he has been declining in memory and mobility since that time.  States he had 1 episode of coffee-ground emesis this morning.  No diarrhea, cough or fever states speech is sometimes difficult to understand, but endorses normal appetite and has been drinking fluids well.  Reported fever or recent fall.   Home Medications Prior to Admission medications   Medication Sig Start Date End Date Taking? Authorizing Provider  acetaminophen  (TYLENOL ) 325 MG tablet Take 650 mg by mouth 2 (two) times daily.    [provider]  apixaban  (ELIQUIS ) 2.5 MG TABS tablet Take 1 tablet (2.5 mg total) by mouth 2 (two) times daily. 08/14/23   Johnson, Clanford L, MD  budesonide-formoterol  (SYMBICORT) 160-4.5 MCG/ACT inhaler Inhale 2 puffs into the lungs 2 (two) times daily.    [provider]  calcium  carbonate (OSCAL) 1500 (600 Ca) MG TABS tablet Take 600 mg of elemental calcium  by mouth daily.    [provider]  cholecalciferol  (VITAMIN D3) 25 MCG (1000 UT) tablet  Take 2,000 Units by mouth daily.    [provider]  dextromethorphan  (DELSYM ) 30 MG/5ML liquid Take 5 mLs (30 mg total) by mouth 2 (two) times daily as needed for cough. 08/14/23   Johnson, Clanford L, MD  docusate sodium  (COLACE) 100 MG capsule Take 100 mg by mouth daily.    [provider]  escitalopram  (LEXAPRO ) 10 MG tablet Take 1 tablet (10 mg total) by mouth daily. 09/07/19   Marsa Arlean BIRCH, MD  fluticasone  (FLONASE ) 50 MCG/ACT nasal spray Place 2 sprays into both nostrils daily as needed for allergies or rhinitis. 09/07/19   Marsa Arlean BIRCH, MD  furosemide  (LASIX ) 40 MG tablet Take 40 mg by mouth daily.    [provider]  guaiFENesin  (MUCINEX ) 600 MG 12 hr tablet Take 600 mg by mouth 2 (two) times daily as needed for cough or to loosen phlegm.    [provider]  ipratropium-albuterol  (DUONEB) 0.5-2.5 (3) MG/3ML SOLN Take 3 mLs by nebulization every 4 (four) hours as needed (shortness of breath, cough, wheezing). 08/14/23   Johnson, Clanford L, MD  lidocaine 4 % Place 1 patch onto the skin daily. Apply 1 patch to mid lower back every day for 12 hours on and 12 hours off.    [provider]  loperamide (IMODIUM) 2 MG capsule Take 2 mg by mouth as needed for diarrhea or loose stools.    [provider]  Melatonin 10 MG TABS Take 10 mg by mouth at bedtime.    [provider]  Multiple Vitamin (MULTIVITAMIN WITH MINERALS) TABS tablet Take 1 tablet by mouth daily.    [provider]  Omeprazole -Sodium Bicarbonate  (ZEGERID  OTC) 20-1100 MG CAPS capsule Take 1 capsule by mouth daily before breakfast. 09/07/19   Marsa Arlean BIRCH, MD  ondansetron  (ZOFRAN ) 4 MG tablet Take 4 mg by mouth every 8 (eight) hours as needed for nausea or vomiting.    [provider]  polyethylene glycol (MIRALAX  / GLYCOLAX ) 17 g packet Take 17 g by mouth daily as needed for moderate constipation.    [provider]  simvastatin  (ZOCOR ) 20  MG tablet Take 1 tablet (20 mg total) by mouth daily at 6 PM. 09/07/19   Marsa Arlean BIRCH, MD  sodium chloride  (MURO 128) 5 % ophthalmic solution Place 1 drop into both eyes 2 (two) times daily.    [provider]  tamsulosin  (FLOMAX ) 0.4 MG CAPS capsule Take 0.4 mg by mouth daily.    [provider]  zinc  oxide 20 % ointment Apply 1 Application topically daily as needed (Redness).    [provider]      Allergies    Patient has no known allergies.    Review of Systems   Review of Systems  Unable to perform ROS: Mental status change  Respiratory:  Negative for shortness of breath.   Gastrointestinal:  Positive for vomiting. Negative for abdominal distention.    Physical Exam Updated Vital Signs BP 115/72   Pulse 89   Temp 97.7 F (36.5 C) (Oral)   Resp 20   SpO2 95%  Physical Exam Vitals and nursing note reviewed.  Constitutional:      General: He is not in acute distress.    Appearance: He is obese. He is not diaphoretic.     Comments: Patient appears somnolent, attempts to answer questions but only moaning.  He is asking for coffee  HENT:     Head: Atraumatic.     Mouth/Throat:     Mouth: Mucous membranes are dry.     Comments: Mucous membranes are very dry.  There is blackish to dark brown material in the mouth and on the tongue Eyes:     Conjunctiva/sclera: Conjunctivae normal.     Pupils: Pupils are equal, round, and reactive to light.  Cardiovascular:     Rate and Rhythm: Normal rate and regular rhythm.     Pulses: Normal pulses.  Pulmonary:     Effort: Pulmonary effort is normal. No respiratory distress.     Breath sounds: No wheezing or rales.     Comments: Lung sounds slightly diminished bilaterally. Abdominal:     General: There is distension.     Palpations: Abdomen is soft.     Tenderness: There is no abdominal tenderness. There is no guarding.  Musculoskeletal:     Cervical back: Normal range of motion. No rigidity.     Right  lower leg: Edema present.     Left lower leg: Edema present.  Lymphadenopathy:     Cervical: No cervical adenopathy.  Skin:    General: Skin is warm.     Capillary Refill: Capillary refill takes less than 2 seconds.     ED Results / Procedures / Treatments   Labs (all labs ordered are listed, but only abnormal results are displayed) Labs Reviewed  CBC WITH DIFFERENTIAL/PLATELET - Abnormal; Notable for the following components:  Result Value   RBC 3.99 (*)    Hemoglobin 12.6 (*)    MCV 102.3 (*)    All other components within normal limits  COMPREHENSIVE METABOLIC PANEL - Abnormal; Notable for the following components:   Glucose, Bld 127 (*)    BUN 33 (*)    Creatinine, Ser 1.75 (*)    Calcium  8.8 (*)    Albumin 3.1 (*)    GFR, Estimated 37 (*)    All other components within normal limits  RESP PANEL BY RT-PCR (RSV, FLU A&B, COVID)  RVPGX2  I-STAT CHEM 8, ED  TYPE AND SCREEN    EKG None  Radiology CT Head Wo Contrast Result Date: 09/18/2023 CLINICAL DATA:  Coffee-ground emesis, altered mental status EXAM: CT HEAD WITHOUT CONTRAST TECHNIQUE: Contiguous axial images were obtained from the base of the skull through the vertex without intravenous contrast. RADIATION DOSE REDUCTION: This exam was performed according to the departmental dose-optimization program which includes automated exposure control, adjustment of the mA and/or kV according to patient size and/or use of iterative reconstruction technique. COMPARISON:  08/12/2023 age related cerebral atrophy. FINDINGS: Brain: No evidence of acute infarction, hemorrhage, mass, mass effect, or midline shift. No hydrocephalus or extra-axial fluid collection. Periventricular white matter changes, likely the sequela of chronic small vessel ischemic disease. Vascular: No hyperdense vessel. Skull: Negative for fracture or focal lesion. Sinuses/Orbits: Mucosal thickening throughout the paranasal sinuses. No acute finding in the orbits.  Postsurgical changes in the globes. Other: The mastoid air cells are well aerated. IMPRESSION: No acute intracranial process. Electronically Signed   By: Donald Campion M.D.   On: 09/18/2023 18:49   CT ABDOMEN PELVIS W CONTRAST Result Date: 09/18/2023 CLINICAL DATA:  Coffee-ground emesis raising a concern for gastrointestinal bleeding. Abdominal distention. The patient reports no abdominal pain at this time. EXAM: CT ABDOMEN AND PELVIS WITH CONTRAST TECHNIQUE: Multidetector CT imaging of the abdomen and pelvis was performed using the standard protocol following bolus administration of intravenous contrast. RADIATION DOSE REDUCTION: This exam was performed according to the departmental dose-optimization program which includes automated exposure control, adjustment of the mA and/or kV according to patient size and/or use of iterative reconstruction technique. CONTRAST:  75mL OMNIPAQUE  IOHEXOL  300 MG/ML  SOLN COMPARISON:  01/23/2023 FINDINGS: Lower chest: Stable mildly enlarged heart. Atheromatous calcifications, including the coronary arteries and aorta. Mild-to-moderate dependent atelectasis at both lung bases. Hepatobiliary: No focal liver abnormality is seen. No gallstones, gallbladder wall thickening, or biliary dilatation. Pancreas: Moderate to marked diffuse pancreatic atrophy. Spleen: Normal in size without focal abnormality. Adrenals/Urinary Tract: Adrenal glands are unremarkable. Kidneys are normal, without renal calculi, focal lesion, or hydronephrosis. Bladder is unremarkable. Stomach/Bowel: Interval mildly dilated gas and fluid-filled proximal and mid small bowel with normal caliber distal small bowel. The transition point appears to be in the lower abdomen to the left of midline with no obstructing mass seen. The previously demonstrated small bowel loop within a small umbilical hernia containing fat is no longer in the hernia. The hernia only contains fat today. The stomach is distended. Normal caliber  colon with the exception of interval mild dilatation of the rectum and distal sigmoid colon. Stool and fluid in both. The appendix is not identified. No secondary signs of appendicitis. Vascular/Lymphatic: Atheromatous arterial calcifications without aneurysm. No enlarged lymph nodes. Reproductive: Prostate is unremarkable. Other: Small umbilical and bilateral inguinal hernias containing fat. Musculoskeletal: Lumbar and lower thoracic spine degenerative changes with changes of DISH. Multiple lumbar and lower thoracic spine compression  deformities without significant change. No new fractures or bony retropulsion. IMPRESSION: 1. Interval partial small bowel obstruction with the transition point in the lower abdomen to the left of midline. This is most likely due to adhesion formation. 2. Interval mild dilatation of the rectum and distal sigmoid colon with stool and fluid in both. 3. Stable mild cardiomegaly. 4.  Calcific coronary artery and aortic atherosclerosis. Aortic Atherosclerosis (ICD10-I70.0). Electronically Signed   By: Elspeth Bathe M.D.   On: 09/18/2023 16:47   DG Chest Port 1 View Result Date: 09/18/2023 CLINICAL DATA:  Shortness of breath and coffee-ground emesis EXAM: PORTABLE CHEST 1 VIEW COMPARISON:  08/14/2023 FINDINGS: Low lung volumes accentuate cardiomediastinal silhouette and pulmonary vascularity. Aortic atherosclerotic calcification. Bibasilar atelectasis/scarring. The lungs are otherwise clear. No pleural effusion or pneumothorax. IMPRESSION: Low lung volumes with bibasilar atelectasis/scarring. Electronically Signed   By: Norman Gatlin M.D.   On: 09/18/2023 09:19    Procedures Procedures    Medications Ordered in ED Medications  pantoprazole  (PROTONIX ) injection 40 mg (40 mg Intravenous Given 09/18/23 0927)  sodium chloride  0.9 % bolus 1,000 mL (1,000 mLs Intravenous New Bag/Given 09/18/23 9076)  ondansetron  (ZOFRAN ) injection 4 mg (4 mg Intravenous Given 09/18/23 9072)    ED  Course/ Medical Decision Making/ A&P                                 Medical Decision Making Patient brought in from assisted living facility.  Patient is DNR.  Anticoagulated.  Brought in for reported coffee-ground emesis this morning x 1.  Patient appears somnolent but awakens to attempt to answer questions he is only able to moan.  He was able to ask for coffee.  Shakes his no when asked if he is in pain.  Vital signs reviewed.  Abdomen is soft but distended on my exam.  Dark brown to blackish material within the mouth, mucous membranes appear very dry  Differential includes but not limited to possible GI bleed, infectious process, SBO, acute surgical abdomen.  Patient has been somnolent since arrival to the ER.  Report from nursing home that patient has been in mental and physical decline for 1 month.  It is unclear if there is an acute mental status change.  I feel it would be beneficial to obtain CT of head for further evaluation.  Was also noted that he was hypoxic this morning does not have supplemental oxygen requirement at baseline.  He has been observed in the department without respiratory distress.  Oxygen was removed and patient maintaining airway and O2 sats in the lower 90s.   Amount and/or Complexity of Data Reviewed Labs: ordered.    Details: Labs reviewed by me, no evidence of leukocytosis, his hemoglobin is reassuring at 12.6 today.  Chemistries also reassuring creatinine elevated but at baseline respiratory panel negative Radiology: ordered.    Details: Chest x-ray without acute cardiopulmonary finding  CT abdomen and pelvis shows interval partial small bowel obstruction with transition point in the lower abdomen likely felt to be from adhesions  CT head results without acute intracranial process ECG/medicine tests: ordered.    Details: EKG shows atrial flutter with AV block.  Atrial flutter seen on previous EKG  Patient anticoagulated on Eliquis  Discussion of management  or test interpretation with external provider(s):    CT abdomen pelvis shows partial small bowel obstruction.  Patient has been observed in the ER for several hours, no active  vomiting since his arrival.  He has received IV fluids, Zofran  and PPI  Discussed findings with general surgery, Dr. Kallie.  No indication for NG tube at this time since patient is not actively vomiting.  Requested patient be admitted to the hospital and she will consult tomorrow.  Discussed with [facility] hospitalist, Dr. Adefeso who agrees to admit    Risk Prescription drug management. Decision regarding hospitalization.           Final Clinical Impression(s) / ED Diagnoses Final diagnoses:  Small bowel obstruction [facility])    Rx / DC Orders ED Discharge Orders     None         Herlinda Milling, PA-C 09/18/23 1931    Suzette Pac, MD 09/21/23 [phone]

## 2023-09-18 NOTE — ED Triage Notes (Signed)
 Pt arrived via [facility] from [facility] c/o vomiting that appears coffee ground, denies pain at this time. Hx of COPD not on oxygen at facility, EMS states SpO2 was 86% RA, placed on 2 L [place]

## 2023-09-19 ENCOUNTER — Inpatient Hospital Stay (HOSPITAL_COMMUNITY): Payer: Medicare Other

## 2023-09-19 DIAGNOSIS — K56609 Unspecified intestinal obstruction, unspecified as to partial versus complete obstruction: Secondary | ICD-10-CM | POA: Diagnosis not present

## 2023-09-19 LAB — CBC
HCT: 38.8 % — ABNORMAL LOW (ref 39.0–52.0)
Hemoglobin: 11.6 g/dL — ABNORMAL LOW (ref 13.0–17.0)
MCH: 31.9 pg (ref 26.0–34.0)
MCHC: 29.9 g/dL — ABNORMAL LOW (ref 30.0–36.0)
MCV: 106.6 fL — ABNORMAL HIGH (ref 80.0–100.0)
Platelets: 157 10*3/uL (ref 150–400)
RBC: 3.64 MIL/uL — ABNORMAL LOW (ref 4.22–5.81)
RDW: 13.4 % (ref 11.5–15.5)
WBC: 6.3 10*3/uL (ref 4.0–10.5)
nRBC: 0 % (ref 0.0–0.2)

## 2023-09-19 LAB — COMPREHENSIVE METABOLIC PANEL
ALT: 15 U/L (ref 0–44)
AST: 24 U/L (ref 15–41)
Albumin: 2.7 g/dL — ABNORMAL LOW (ref 3.5–5.0)
Alkaline Phosphatase: 58 U/L (ref 38–126)
Anion gap: 7 (ref 5–15)
BUN: 36 mg/dL — ABNORMAL HIGH (ref 8–23)
CO2: 27 mmol/L (ref 22–32)
Calcium: 8.1 mg/dL — ABNORMAL LOW (ref 8.9–10.3)
Chloride: 106 mmol/L (ref 98–111)
Creatinine, Ser: 1.54 mg/dL — ABNORMAL HIGH (ref 0.61–1.24)
GFR, Estimated: 43 mL/min — ABNORMAL LOW (ref >60)
Glucose, Bld: 85 mg/dL (ref 70–99)
Potassium: 4.1 mmol/L (ref 3.5–5.1)
Sodium: 140 mmol/L (ref 135–145)
Total Bilirubin: 0.6 mg/dL (ref 0.0–1.2)
Total Protein: 5.8 g/dL — ABNORMAL LOW (ref 6.5–8.1)

## 2023-09-19 LAB — CBG MONITORING, ED
Glucose-Capillary: 71 mg/dL (ref 70–99)
Glucose-Capillary: 72 mg/dL (ref 70–99)
Glucose-Capillary: 73 mg/dL (ref 70–99)
Glucose-Capillary: 84 mg/dL (ref 70–99)
Glucose-Capillary: 86 mg/dL (ref 70–99)

## 2023-09-19 LAB — GLUCOSE, CAPILLARY
Glucose-Capillary: 91 mg/dL (ref 70–99)
Glucose-Capillary: 102 mg/dL — ABNORMAL HIGH (ref 70–99)

## 2023-09-19 LAB — HEMOGLOBIN AND HEMATOCRIT, BLOOD
HCT: 42.2 % (ref 39.0–52.0)
Hemoglobin: 12.7 g/dL — ABNORMAL LOW (ref 13.0–17.0)

## 2023-09-19 LAB — I-STAT CHEM 8, ED
BUN: 32 mg/dL — ABNORMAL HIGH (ref 8–23)
Calcium, Ion: 1.14 mmol/L — ABNORMAL LOW (ref 1.15–1.40)
Chloride: 102 mmol/L (ref 98–111)
Creatinine, Ser: 1.9 mg/dL — ABNORMAL HIGH (ref 0.61–1.24)
Glucose, Bld: 124 mg/dL — ABNORMAL HIGH (ref 70–99)
HCT: 38 % — ABNORMAL LOW (ref 39.0–52.0)
Hemoglobin: 12.9 g/dL — ABNORMAL LOW (ref 13.0–17.0)
Potassium: 4.2 mmol/L (ref 3.5–5.1)
Sodium: 141 mmol/L (ref 135–145)
TCO2: 29 mmol/L (ref 22–32)

## 2023-09-19 LAB — MAGNESIUM: Magnesium: 2 mg/dL (ref 1.7–2.4)

## 2023-09-19 LAB — FOLATE: Folate: 30.2 ng/mL (ref >5.9)

## 2023-09-19 LAB — VITAMIN B12: Vitamin B-12: 366 pg/mL (ref 180–914)

## 2023-09-19 LAB — PHOSPHORUS: Phosphorus: 2.8 mg/dL (ref 2.5–4.6)

## 2023-09-19 MED ORDER — DEXTROSE-SODIUM CHLORIDE 5-0.9 % IV SOLN: INTRAVENOUS | Status: AC

## 2023-09-19 MED ORDER — DOCUSATE SODIUM 100 MG PO CAPS
100.0000 mg | ORAL_CAPSULE | Freq: Two times a day (BID) | ORAL | Status: DC
  Administered 2023-09-19 – 2023-09-22 (×6): 100 mg via ORAL
  Filled 2023-09-19 (×6): qty 1

## 2023-09-19 MED ORDER — DEXTROSE-SODIUM CHLORIDE 5-0.9 % IV SOLN: INTRAVENOUS | Status: DC

## 2023-09-19 NOTE — Progress Notes (Signed)
   09/19/23 1038  TOC Brief Assessment  Insurance and Status Reviewed  Patient has primary care physician Yes  Home environment has been reviewed From NP of [facility]  Prior level of function: Needs assistance  Prior/Current Home Services Current home services (ALF)  Readmission risk has been reviewed Yes  Transition of care needs transition of care needs identified, TOC will continue to follow    Pt from an ALF, plan is to return.

## 2023-09-19 NOTE — Consult Note (Signed)
 [facility] History and Physical  Reason for Referral: SBO Referring Physician:  Dr. Maree   Chief Complaint   Emesis     Casey Cooley is a 89 y.o. male.  HPI: Casey Cooley is an 89 yo comes from a SNF and has a history of COPD, HLD, CKD, GERD, DM, A flutter on Eliquis  who came in from [facility] via EMS with coffee ground emesis.  He was evaluated in the ED and found to have concern for possible SBO with dilated small bowel. He reports he had a BM in the ED that was large. He denies any abdominal pain. He has not had any abdominal surgeries. He has a known hernia but no symptoms from the umbilical hernia. He really wants coffee.   Past Medical History:  Diagnosis Date   BPH (benign prostatic hyperplasia)    per MAR   CKD (chronic Cooley disease)    COPD (chronic obstructive pulmonary disease) (HCC)    Depression    Diabetes mellitus without complication (HCC)    GERD (gastroesophageal reflux disease)    Hypercholesterolemia    Hyperlipidemia    Hypertension    RETINAL DETACHMENT, BILATERAL, HX OF 05/31/2009   Qualifier: Diagnosis of  By: Winona Raring      Past Surgical History:  Procedure Laterality Date   EYE SURGERY     FOOT SURGERY      No family history on file.  Social History   Tobacco Use   Smoking status: Some Days    Types: Cigarettes   Smokeless tobacco: Never  Vaping Use   Vaping status: Never Used  Substance Use Topics   Alcohol use: Not Currently    Comment: occ   Drug use: Never    Medications: I have reviewed the patient's current medications. Prior to Admission:  Medications Prior to Admission  Medication Sig Dispense Refill Last Dose/Taking   acetaminophen  (TYLENOL ) 325 MG tablet Take 650 mg by mouth 2 (two) times daily.   09/17/2023   apixaban  (ELIQUIS ) 2.5 MG TABS tablet Take 1 tablet (2.5 mg total) by mouth 2 (two) times daily.   09/17/2023 at  8:00 PM   benzocaine (ORAJEL) 10 % mucosal gel Use as directed 1 Application in  the mouth or throat 3 (three) times daily. Before each meal.   09/16/2023   budesonide-formoterol  (SYMBICORT) 160-4.5 MCG/ACT inhaler Inhale 2 puffs into the lungs 2 (two) times daily.   09/17/2023   calcium  carbonate (OSCAL) 1500 (600 Ca) MG TABS tablet Take 600 mg of elemental calcium  by mouth daily.   09/17/2023   cholecalciferol  (VITAMIN D3) 25 MCG (1000 UT) tablet Take 2,000 Units by mouth daily.   09/17/2023   dextromethorphan  (DELSYM ) 30 MG/5ML liquid Take 5 mLs (30 mg total) by mouth 2 (two) times daily as needed for cough.   Taking As Needed   docusate sodium  (COLACE) 100 MG capsule Take 100 mg by mouth daily.   09/17/2023   escitalopram  (LEXAPRO ) 10 MG tablet Take 1 tablet (10 mg total) by mouth daily. (Patient taking differently: Take 5 mg by mouth daily.) 30 tablet 0 09/17/2023   fluticasone  (FLONASE ) 50 MCG/ACT nasal spray Place 2 sprays into both nostrils daily as needed for allergies or rhinitis. 9.9 mL 0 Taking As Needed   furosemide  (LASIX ) 40 MG tablet Take 40 mg by mouth daily.   09/17/2023   ipratropium-albuterol  (DUONEB) 0.5-2.5 (3) MG/3ML SOLN Take 3 mLs by nebulization every 4 (four) hours as needed (shortness of  breath, cough, wheezing).   Taking As Needed   lidocaine 4 % Place 1 patch onto the skin daily. Apply 1 patch to mid lower back every day for 12 hours on and 12 hours off.   09/17/2023   loperamide (IMODIUM) 2 MG capsule Take 2 mg by mouth as needed for diarrhea or loose stools.   Taking As Needed   LORazepam  (ATIVAN ) 0.5 MG tablet Take 0.25 mg by mouth 2 (two) times daily.   09/17/2023   Melatonin 10 MG TABS Take 10 mg by mouth at bedtime.   09/17/2023   Multiple Vitamin (MULTIVITAMIN WITH MINERALS) TABS tablet Take 1 tablet by mouth daily.   09/17/2023   Omeprazole -Sodium Bicarbonate  (ZEGERID  OTC) 20-1100 MG CAPS capsule Take 1 capsule by mouth daily before breakfast. 30 capsule 0 09/17/2023   ondansetron  (ZOFRAN ) 4 MG tablet Take 4 mg by mouth every 8 (eight) hours as needed for nausea or  vomiting.   Taking As Needed   polyethylene glycol (MIRALAX  / GLYCOLAX ) 17 g packet Take 17 g by mouth daily as needed for moderate constipation.   Taking As Needed   simvastatin  (ZOCOR ) 20 MG tablet Take 1 tablet (20 mg total) by mouth daily at 6 PM. 30 tablet 0 09/17/2023   sodium chloride  (MURO 128) 5 % ophthalmic solution Place 1 drop into both eyes 2 (two) times daily.   09/17/2023   tamsulosin  (FLOMAX ) 0.4 MG CAPS capsule Take 0.4 mg by mouth daily.   09/17/2023   zinc  oxide 20 % ointment Apply 1 Application topically daily as needed (Redness).   Taking As Needed   Scheduled:  insulin  aspart  0-9 Units Subcutaneous Q4H   pantoprazole  (PROTONIX ) IV  40 mg Intravenous Q24H   Continuous:  dextrose  5 % and 0.9 % NaCl 75 mL/hr at 09/19/23 1502   PRN:acetaminophen  **OR** acetaminophen , morphine  injection, ondansetron  **OR** ondansetron  (ZOFRAN ) IV  No Known Allergies    ROS:  A comprehensive review of systems was negative except for: Gastrointestinal: positive for abdominal pain and coffee ground emesis  Blood pressure 138/65, pulse 64, temperature 97.8 F (36.6 C), temperature source Oral, resp. rate 14, SpO2 96%. Physical Exam Vitals reviewed.  HENT:     Head: Normocephalic.     Nose: Nose normal.  Eyes:     Extraocular Movements: Extraocular movements intact.  Cardiovascular:     Rate and Rhythm: Normal rate.  Pulmonary:     Effort: Pulmonary effort is normal.  Abdominal:     General: There is distension.     Palpations: Abdomen is soft.     Tenderness: There is no abdominal tenderness.  Musculoskeletal:        General: Normal range of motion.  Skin:    General: Skin is warm.  Neurological:     General: No focal deficit present.     Mental Status: He is alert.  Psychiatric:        Mood and Affect: Mood normal.     Results: Results for orders placed or performed during the hospital encounter of 09/18/23 (from the past 48 hours)  Resp panel by RT-PCR (RSV, Flu A&B,  Covid) Anterior Nasal Swab     Status: None   Collection Time: 09/18/23  9:19 AM   Specimen: Anterior Nasal Swab  Result Value Ref Range   SARS Coronavirus 2 by RT PCR NEGATIVE NEGATIVE    Comment: (NOTE) SARS-CoV-2 target nucleic acids are NOT DETECTED.  The SARS-CoV-2 RNA is generally detectable in upper respiratory  specimens during the acute phase of infection. The lowest concentration of SARS-CoV-2 viral copies this assay can detect is 138 copies/mL. A negative result does not preclude SARS-Cov-2 infection and should not be used as the sole basis for treatment or other patient management decisions. A negative result may occur with  improper specimen collection/handling, submission of specimen other than nasopharyngeal swab, presence of viral mutation(s) within the areas targeted by this assay, and inadequate number of viral copies(<138 copies/mL). A negative result must be combined with clinical observations, patient history, and epidemiological information. The expected result is Negative.  Fact Sheet for Patients:  bloggercourse.com  Fact Sheet for Healthcare Providers:  seriousbroker.it  This test is no t yet approved or cleared by the [place]  FDA and  has been authorized for detection and/or diagnosis of SARS-CoV-2 by FDA under an Emergency Use Authorization (EUA). This EUA will remain  in effect (meaning this test can be used) for the duration of the COVID-19 declaration under Section 564(b)(1) of the Act, 21 U.S.C.section 360bbb-3(b)(1), unless the authorization is terminated  or revoked sooner.       Influenza A by PCR NEGATIVE NEGATIVE   Influenza B by PCR NEGATIVE NEGATIVE    Comment: (NOTE) The Xpert Xpress SARS-CoV-2/FLU/RSV plus assay is intended as an aid in the diagnosis of influenza from Nasopharyngeal swab specimens and should not be used as a sole basis for treatment. Nasal washings and aspirates  are unacceptable for Xpert Xpress SARS-CoV-2/FLU/RSV testing.  Fact Sheet for Patients: bloggercourse.com  Fact Sheet for Healthcare Providers: seriousbroker.it  This test is not yet approved or cleared by the [place]  FDA and has been authorized for detection and/or diagnosis of SARS-CoV-2 by FDA under an Emergency Use Authorization (EUA). This EUA will remain in effect (meaning this test can be used) for the duration of the COVID-19 declaration under Section 564(b)(1) of the Act, 21 U.S.C. section 360bbb-3(b)(1), unless the authorization is terminated or revoked.     Resp Syncytial Virus by PCR NEGATIVE NEGATIVE    Comment: (NOTE) Fact Sheet for Patients: bloggercourse.com  Fact Sheet for Healthcare Providers: seriousbroker.it  This test is not yet approved or cleared by the [place]  FDA and has been authorized for detection and/or diagnosis of SARS-CoV-2 by FDA under an Emergency Use Authorization (EUA). This EUA will remain in effect (meaning this test can be used) for the duration of the COVID-19 declaration under Section 564(b)(1) of the Act, 21 U.S.C. section 360bbb-3(b)(1), unless the authorization is terminated or revoked.  Performed at [facility], [address] [place], [place] [address]   I-stat chem 8, ED (not at [facility], [facility] or [facility])     Status: Abnormal   Collection Time: 09/18/23  9:25 AM  Result Value Ref Range   Sodium 141 135 - 145 mmol/L   Potassium 4.2 3.5 - 5.1 mmol/L   Chloride 102 98 - 111 mmol/L   BUN 32 (H) 8 - 23 mg/dL   Creatinine, Ser 8.09 (H) 0.61 - 1.24 mg/dL   Glucose, Bld 875 (H) 70 - 99 mg/dL    Comment: Glucose reference range applies only to samples taken after fasting for at least 8 hours.   Calcium , Ion 1.14 (L) 1.15 - 1.40 mmol/L   TCO2 29 22 - 32 mmol/L   Hemoglobin 12.9 (L) 13.0 - 17.0 g/dL   HCT 61.9 (L) 60.9 - 47.9 %   CBC with Differential     Status: Abnormal   Collection Time: 09/18/23  9:25  AM  Result Value Ref Range   WBC 7.7 4.0 - 10.5 K/uL   RBC 3.99 (L) 4.22 - 5.81 MIL/uL   Hemoglobin 12.6 (L) 13.0 - 17.0 g/dL   HCT 59.1 60.9 - 47.9 %   MCV 102.3 (H) 80.0 - 100.0 fL   MCH 31.6 26.0 - 34.0 pg   MCHC 30.9 30.0 - 36.0 g/dL   RDW 86.7 88.4 - 84.4 %   Platelets 180 150 - 400 K/uL   nRBC 0.0 0.0 - 0.2 %   Neutrophils Relative % 77 %   Neutro Abs 6.0 1.7 - 7.7 K/uL   Lymphocytes Relative 11 %   Lymphs Abs 0.9 0.7 - 4.0 K/uL   Monocytes Relative 11 %   Monocytes Absolute 0.8 0.1 - 1.0 K/uL   Eosinophils Relative 0 %   Eosinophils Absolute 0.0 0.0 - 0.5 K/uL   Basophils Relative 0 %   Basophils Absolute 0.0 0.0 - 0.1 K/uL   Immature Granulocytes 1 %   Abs Immature Granulocytes 0.04 0.00 - 0.07 K/uL    Comment: Performed at [facility], [address] [place], [place] [address]  Comprehensive metabolic panel     Status: Abnormal   Collection Time: 09/18/23  9:25 AM  Result Value Ref Range   Sodium 140 135 - 145 mmol/L   Potassium 4.2 3.5 - 5.1 mmol/L   Chloride 101 98 - 111 mmol/L   CO2 28 22 - 32 mmol/L   Glucose, Bld 127 (H) 70 - 99 mg/dL    Comment: Glucose reference range applies only to samples taken after fasting for at least 8 hours.   BUN 33 (H) 8 - 23 mg/dL   Creatinine, Ser 8.24 (H) 0.61 - 1.24 mg/dL   Calcium  8.8 (L) 8.9 - 10.3 mg/dL   Total Protein 6.8 6.5 - 8.1 g/dL   Albumin 3.1 (L) 3.5 - 5.0 g/dL   AST 24 15 - 41 U/L   ALT 16 0 - 44 U/L   Alkaline Phosphatase 70 38 - 126 U/L   Total Bilirubin 0.6 0.0 - 1.2 mg/dL   GFR, Estimated 37 (L) >60 mL/min    Comment: (NOTE) Calculated using the CKD-EPI Creatinine Equation (2021)    Anion gap 11 5 - 15    Comment: Performed at [facility], [address] [place], [place] [address]  Type and screen     Status: None   Collection Time: 09/18/23  9:25 AM  Result Value Ref Range   ABO/RH(D) A POS    Antibody Screen NEG     Sample Expiration      09/21/2023,2359 Performed at [facility], [address], [place], [place] [address]   CBG monitoring, ED     Status: Abnormal   Collection Time: 09/18/23  9:21 PM  Result Value Ref Range   Glucose-Capillary 107 (H) 70 - 99 mg/dL    Comment: Glucose reference range applies only to samples taken after fasting for at least 8 hours.  CBG monitoring, ED     Status: None   Collection Time: 09/19/23 12:08 AM  Result Value Ref Range   Glucose-Capillary 84 70 - 99 mg/dL    Comment: Glucose reference range applies only to samples taken after fasting for at least 8 hours.  Vitamin B12     Status: None   Collection Time: 09/19/23  3:16 AM  Result Value Ref Range   Vitamin B-12 366 180 - 914 pg/mL    Comment: (NOTE) This assay  is not validated for testing neonatal or myeloproliferative syndrome specimens for Vitamin B12 levels. Performed at [facility], [address], [place], [place] [address]   Folate     Status: None   Collection Time: 09/19/23  3:16 AM  Result Value Ref Range   Folate 30.2 >5.9 ng/mL    Comment: RESULT CONFIRMED BY MANUAL DILUTION Performed at [facility], [address], [place], [place] [address]   Comprehensive metabolic panel     Status: Abnormal   Collection Time: 09/19/23  3:16 AM  Result Value Ref Range   Sodium 140 135 - 145 mmol/L   Potassium 4.1 3.5 - 5.1 mmol/L   Chloride 106 98 - 111 mmol/L   CO2 27 22 - 32 mmol/L   Glucose, Bld 85 70 - 99 mg/dL    Comment: Glucose reference range applies only to samples taken after fasting for at least 8 hours.   BUN 36 (H) 8 - 23 mg/dL   Creatinine, Ser 8.45 (H) 0.61 - 1.24 mg/dL   Calcium  8.1 (L) 8.9 - 10.3 mg/dL   Total Protein 5.8 (L) 6.5 - 8.1 g/dL   Albumin 2.7 (L) 3.5 - 5.0 g/dL   AST 24 15 - 41 U/L   ALT 15 0 - 44 U/L   Alkaline Phosphatase 58 38 - 126 U/L   Total Bilirubin 0.6 0.0 - 1.2 mg/dL   GFR, Estimated 43 (L) >60 mL/min    Comment: (NOTE) Calculated using the CKD-EPI  Creatinine Equation (2021)    Anion gap 7 5 - 15    Comment: Performed at [facility], [address], [place], [place] [address]  CBC     Status: Abnormal   Collection Time: 09/19/23  3:16 AM  Result Value Ref Range   WBC 6.3 4.0 - 10.5 K/uL   RBC 3.64 (L) 4.22 - 5.81 MIL/uL   Hemoglobin 11.6 (L) 13.0 - 17.0 g/dL   HCT 61.1 (L) 60.9 - 47.9 %   MCV 106.6 (H) 80.0 - 100.0 fL   MCH 31.9 26.0 - 34.0 pg   MCHC 29.9 (L) 30.0 - 36.0 g/dL   RDW 86.5 88.4 - 84.4 %   Platelets 157 150 - 400 K/uL   nRBC 0.0 0.0 - 0.2 %    Comment: Performed at [facility], [address] [place], [place] [address]  Magnesium     Status: None   Collection Time: 09/19/23  3:16 AM  Result Value Ref Range   Magnesium 2.0 1.7 - 2.4 mg/dL    Comment: Performed at [facility], [address] [place], [place] [address]  Phosphorus     Status: None   Collection Time: 09/19/23  3:16 AM  Result Value Ref Range   Phosphorus 2.8 2.5 - 4.6 mg/dL    Comment: Performed at [facility], [address], [place], [place] [address]  CBG monitoring, ED     Status: None   Collection Time: 09/19/23  4:36 AM  Result Value Ref Range   Glucose-Capillary 72 70 - 99 mg/dL    Comment: Glucose reference range applies only to samples taken after fasting for at least 8 hours.  CBG monitoring, ED     Status: None   Collection Time: 09/19/23  7:49 AM  Result Value Ref Range   Glucose-Capillary 71 70 - 99 mg/dL    Comment: Glucose reference range applies only to samples taken after fasting for at least 8 hours.  Hemoglobin and hematocrit, blood  Status: Abnormal   Collection Time: 09/19/23  9:13 AM  Result Value Ref Range   Hemoglobin 12.7 (L) 13.0 - 17.0 g/dL   HCT 57.7 60.9 - 47.9 %    Comment: Performed at [facility], [address] [place], [place] [address]  CBG monitoring, ED     Status: None   Collection Time: 09/19/23  9:52 AM  Result Value Ref Range   Glucose-Capillary 73 70 - 99 mg/dL    Comment: Glucose reference  range applies only to samples taken after fasting for at least 8 hours.  CBG monitoring, ED     Status: None   Collection Time: 09/19/23 11:37 AM  Result Value Ref Range   Glucose-Capillary 86 70 - 99 mg/dL    Comment: Glucose reference range applies only to samples taken after fasting for at least 8 hours.   Personally reviewed- Dilated SB and fluid in the bowel possible transition in the left abdomen  DG Abd 1 View Result Date: 09/19/2023 CLINICAL DATA:  Nausea/vomiting/abdominal discomfort, umbilical swelling EXAM: ABDOMEN - 1 VIEW COMPARISON:  07/04/2022, 09/18/2023 FINDINGS: Two supine frontal views of the abdomen and pelvis are obtained, excluding the hemidiaphragms by collimation. There is a relative paucity of small bowel gas. There is moderate gaseous distension of the sigmoid colon. No masses or abnormal calcifications. Excreted contrast within the urinary bladder. No acute bony abnormalities. IMPRESSION: 1. Paucity of small bowel gas, with distended fluid-filled loops of small bowel seen on prior CT not well visualized by x-ray. Electronically Signed   By: Ozell Daring M.D.   On: 09/19/2023 15:01   CT Head Wo Contrast Result Date: 09/18/2023 CLINICAL DATA:  Coffee-ground emesis, altered mental status EXAM: CT HEAD WITHOUT CONTRAST TECHNIQUE: Contiguous axial images were obtained from the base of the skull through the vertex without intravenous contrast. RADIATION DOSE REDUCTION: This exam was performed according to the departmental dose-optimization program which includes automated exposure control, adjustment of the mA and/or kV according to patient size and/or use of iterative reconstruction technique. COMPARISON:  08/12/2023 age related cerebral atrophy. FINDINGS: Brain: No evidence of acute infarction, hemorrhage, mass, mass effect, or midline shift. No hydrocephalus or extra-axial fluid collection. Periventricular white matter changes, likely the sequela of chronic small vessel ischemic  disease. Vascular: No hyperdense vessel. Skull: Negative for fracture or focal lesion. Sinuses/Orbits: Mucosal thickening throughout the paranasal sinuses. No acute finding in the orbits. Postsurgical changes in the globes. Other: The mastoid air cells are well aerated. IMPRESSION: No acute intracranial process. Electronically Signed   By: Donald Campion M.D.   On: 09/18/2023 18:49   CT ABDOMEN PELVIS W CONTRAST Result Date: 09/18/2023 CLINICAL DATA:  Coffee-ground emesis raising a concern for gastrointestinal bleeding. Abdominal distention. The patient reports no abdominal pain at this time. EXAM: CT ABDOMEN AND PELVIS WITH CONTRAST TECHNIQUE: Multidetector CT imaging of the abdomen and pelvis was performed using the standard protocol following bolus administration of intravenous contrast. RADIATION DOSE REDUCTION: This exam was performed according to the departmental dose-optimization program which includes automated exposure control, adjustment of the mA and/or kV according to patient size and/or use of iterative reconstruction technique. CONTRAST:  75mL OMNIPAQUE  IOHEXOL  300 MG/ML  SOLN COMPARISON:  01/23/2023 FINDINGS: Lower chest: Stable mildly enlarged heart. Atheromatous calcifications, including the coronary arteries and aorta. Mild-to-moderate dependent atelectasis at both lung bases. Hepatobiliary: No focal liver abnormality is seen. No gallstones, gallbladder wall thickening, or biliary dilatation. Pancreas: Moderate to marked diffuse pancreatic atrophy. Spleen: Normal in size  without focal abnormality. Adrenals/Urinary Tract: Adrenal glands are unremarkable. Kidneys are normal, without renal calculi, focal lesion, or hydronephrosis. Bladder is unremarkable. Stomach/Bowel: Interval mildly dilated gas and fluid-filled proximal and mid small bowel with normal caliber distal small bowel. The transition point appears to be in the lower abdomen to the left of midline with no obstructing mass seen. The  previously demonstrated small bowel loop within a small umbilical hernia containing fat is no longer in the hernia. The hernia only contains fat today. The stomach is distended. Normal caliber colon with the exception of interval mild dilatation of the rectum and distal sigmoid colon. Stool and fluid in both. The appendix is not identified. No secondary signs of appendicitis. Vascular/Lymphatic: Atheromatous arterial calcifications without aneurysm. No enlarged lymph nodes. Reproductive: Prostate is unremarkable. Other: Small umbilical and bilateral inguinal hernias containing fat. Musculoskeletal: Lumbar and lower thoracic spine degenerative changes with changes of DISH. Multiple lumbar and lower thoracic spine compression deformities without significant change. No new fractures or bony retropulsion. IMPRESSION: 1. Interval partial small bowel obstruction with the transition point in the lower abdomen to the left of midline. This is most likely due to adhesion formation. 2. Interval mild dilatation of the rectum and distal sigmoid colon with stool and fluid in both. 3. Stable mild cardiomegaly. 4.  Calcific coronary artery and aortic atherosclerosis. Aortic Atherosclerosis (ICD10-I70.0). Electronically Signed   By: Elspeth Bathe M.D.   On: 09/18/2023 16:47   DG Chest Port 1 View Result Date: 09/18/2023 CLINICAL DATA:  Shortness of breath and coffee-ground emesis EXAM: PORTABLE CHEST 1 VIEW COMPARISON:  08/14/2023 FINDINGS: Low lung volumes accentuate cardiomediastinal silhouette and pulmonary vascularity. Aortic atherosclerotic calcification. Bibasilar atelectasis/scarring. The lungs are otherwise clear. No pleural effusion or pneumothorax. IMPRESSION: Low lung volumes with bibasilar atelectasis/scarring. Electronically Signed   By: Norman Gatlin M.D.   On: 09/18/2023 09:19     Assessment & Plan:  Casey Cooley is a 89 y.o. male with possible SBO. Doing well and says he had a BM. No pain today. KUB today  with paucity of air which is hard to know if that still could be an obstruction. No large dilated loops of bowel.   -Sips of water, juice, and black coffee ok -Will hold on tray at this time  -Will see if continues to have Bms added some colace   All questions were answered to the satisfaction of the patient. Updated team.    Manuelita JAYSON Pander 09/19/2023, 4:05 PM

## 2023-09-19 NOTE — ED Notes (Signed)
 Update given to [facility] at [facility]

## 2023-09-19 NOTE — Progress Notes (Signed)
 PROGRESS NOTE    Casey Cooley  FMW:[id number] DOB: 1932-10-18 DOA: 09/18/2023 PCP: [name] SQUIBB, MD   Brief Narrative:    Casey Cooley is a 89 y.o. male with medical history significant of hypertension, hyperlipidemia, COPD, CKD stage III, GERD, T2DM, atrial flutter on Eliquis  who presents to the emergency department from [facility] via EMS due to vomiting of coffee-ground emesis with shortness of breath this morning.  Patient was noted to have partial small bowel obstruction and general surgery consulted for evaluation.  Eliquis  has been held.  Assessment & Plan:   Principal Problem:   Small bowel obstruction (HCC) Active Problems:   Mixed hyperlipidemia   Essential hypertension   GERD without esophagitis   COPD (chronic obstructive pulmonary disease) (HCC)   Atrial flutter (HCC)   Chronic kidney disease, stage 3b (HCC)   Vomiting   Macrocytic anemia   Hypoalbuminemia due to protein-calorie malnutrition (HCC)   Diabetes mellitus type 2 in nonobese (HCC)   Chronic diastolic CHF (congestive heart failure) (HCC)   Tobacco use disorder  Assessment and Plan:   Small bowel obstruction No indication for NG tube at this time Continue NPO at this time with plan to advance diet as tolerated Continue IV hydration Continue IV morphine  2 mg q.4h p.r.n. for moderate/severe pain Continue IV Protonix  40 mg daily Continue Zofran  p.r.n. for nausea/vomiting Appreciate general surgery evaluation   Vomiting with coffee-ground emesis Continue Zofran  as needed Hold Eliquis  and monitor CBC Currently with stable hemoglobin levels and no further vomiting   Macrocytic anemia MCV 102.3, hemoglobin 12.6 Folate and B12 within normal limits   CKD 3B Dehydration BUN/creatinine 33/1.75 (creatinine is within baseline range). Renally adjust medications, avoid nephrotoxic agents/dehydration/hypotension   Hypoalbuminemia possibly due to mild protein calorie malnutrition Albumin 3.1,  consider protein supplementation when SBO resolves   A flutter Rate is currently controlled, not on any AV blocking agents Apixaban  will be temporarily held pending surgical evaluation     Essential hypertension (controlled) No BP meds noted on med rec   Mixed hyperlipidemia Consider home statin when patient resumes oral intake   Diabetes mellitus, type II A1c 6.2% on 08/12/2023 Continue ISS and hypoglycemia protocol   GERD Continue Protonix    Chronic diastolic CHF Chronic lower extremity edema Most recent echo June 2024 with a preserved EF - 60-65% Lasix  temporarily held due to dehydration   Tobacco use disorder Still smoking cigar and pipe intermittently in the facility, continue nicotine  patch Patient was counseled on tobacco abuse cessation    DVT prophylaxis: SCDs Code Status: DNR Family Communication: None at bedside Disposition Plan:  Status is: Inpatient Remains inpatient appropriate because: Need for IV fluids and medications.   Consultants:  General surgery  Procedures:  None  Antimicrobials:  None   Subjective: Patient seen and evaluated today with no new acute complaints or concerns. No acute concerns or events noted overnight.  He is asking if he can have something to drink.  Objective: Vitals:   09/19/23 0000 09/19/23 0200 09/19/23 0400 09/19/23 0610  BP: 129/73 121/70 126/67   Pulse: 86 86 85   Resp: (!) 28 14 20    Temp:    97.8 F (36.6 C)  TempSrc:      SpO2: (!) 89% 92% 94%     Intake/Output Summary (Last 24 hours) at 09/19/2023 0706 Last data filed at 09/18/2023 1030 Gross per 24 hour  Intake 1000 ml  Output --  Net 1000 ml   There were  no vitals filed for this visit.  Examination:  General exam: Appears calm and comfortable  Respiratory system: Clear to auscultation. Respiratory effort normal.  Nasal cannula oxygen. Cardiovascular system: S1 & S2 heard, RRR.  Gastrointestinal system: Abdomen is soft Central nervous system:  Alert and awake Extremities: No edema Skin: No significant lesions noted Psychiatry: Flat affect.    Data Reviewed: I have personally reviewed following labs and imaging studies  CBC: Recent Labs  Lab 09/18/23 0925 09/19/23 0316  WBC 7.7 6.3  NEUTROABS 6.0  --   HGB 12.9*  12.6* 11.6*  HCT 38.0*  40.8 38.8*  MCV 102.3* 106.6*  PLT 180 157   Basic Metabolic Panel: Recent Labs  Lab 09/18/23 0925 09/19/23 0316  NA 141  140 140  K 4.2  4.2 4.1  CL 102  101 106  CO2 28 27  GLUCOSE 124*  127* 85  BUN 32*  33* 36*  CREATININE 1.90*  1.75* 1.54*  CALCIUM  8.8* 8.1*  MG  --  2.0  PHOS  --  2.8   GFR: CrCl cannot be calculated (Unknown ideal weight.). Liver Function Tests: Recent Labs  Lab 09/18/23 0925 09/19/23 0316  AST 24 24  ALT 16 15  ALKPHOS 70 58  BILITOT 0.6 0.6  PROT 6.8 5.8*  ALBUMIN 3.1* 2.7*   No results for input(s): LIPASE, AMYLASE in the last 168 hours. No results for input(s): AMMONIA in the last 168 hours. Coagulation Profile: No results for input(s): INR, PROTIME in the last 168 hours. Cardiac Enzymes: No results for input(s): CKTOTAL, CKMB, CKMBINDEX, TROPONINI in the last 168 hours. BNP (last 3 results) No results for input(s): PROBNP in the last 8760 hours. HbA1C: No results for input(s): HGBA1C in the last 72 hours. CBG: Recent Labs  Lab 09/18/23 2121 09/19/23 0008 09/19/23 0436  GLUCAP 107* 84 72   Lipid Profile: No results for input(s): CHOL, HDL, LDLCALC, TRIG, CHOLHDL, LDLDIRECT in the last 72 hours. Thyroid  Function Tests: No results for input(s): TSH, T4TOTAL, FREET4, T3FREE, THYROIDAB in the last 72 hours. Anemia Panel: Recent Labs    09/19/23 0316  VITAMINB12 366   Sepsis Labs: No results for input(s): PROCALCITON, LATICACIDVEN in the last 168 hours.  Recent Results (from the past 240 hours)  Resp panel by RT-PCR (RSV, Flu A&B, Covid) Anterior Nasal Swab      Status: None   Collection Time: 09/18/23  9:19 AM   Specimen: Anterior Nasal Swab  Result Value Ref Range Status   SARS Coronavirus 2 by RT PCR NEGATIVE NEGATIVE Final    Comment: (NOTE) SARS-CoV-2 target nucleic acids are NOT DETECTED.  The SARS-CoV-2 RNA is generally detectable in upper respiratory specimens during the acute phase of infection. The lowest concentration of SARS-CoV-2 viral copies this assay can detect is 138 copies/mL. A negative result does not preclude SARS-Cov-2 infection and should not be used as the sole basis for treatment or other patient management decisions. A negative result may occur with  improper specimen collection/handling, submission of specimen other than nasopharyngeal swab, presence of viral mutation(s) within the areas targeted by this assay, and inadequate number of viral copies(<138 copies/mL). A negative result must be combined with clinical observations, patient history, and epidemiological information. The expected result is Negative.  Fact Sheet for Patients:  bloggercourse.com  Fact Sheet for Healthcare Providers:  seriousbroker.it  This test is no t yet approved or cleared by the [place]  FDA and  has been authorized for detection and/or diagnosis  of SARS-CoV-2 by FDA under an Emergency Use Authorization (EUA). This EUA will remain  in effect (meaning this test can be used) for the duration of the COVID-19 declaration under Section 564(b)(1) of the Act, 21 U.S.C.section 360bbb-3(b)(1), unless the authorization is terminated  or revoked sooner.       Influenza A by PCR NEGATIVE NEGATIVE Final   Influenza B by PCR NEGATIVE NEGATIVE Final    Comment: (NOTE) The Xpert Xpress SARS-CoV-2/FLU/RSV plus assay is intended as an aid in the diagnosis of influenza from Nasopharyngeal swab specimens and should not be used as a sole basis for treatment. Nasal washings and aspirates are  unacceptable for Xpert Xpress SARS-CoV-2/FLU/RSV testing.  Fact Sheet for Patients: bloggercourse.com  Fact Sheet for Healthcare Providers: seriousbroker.it  This test is not yet approved or cleared by the [place]  FDA and has been authorized for detection and/or diagnosis of SARS-CoV-2 by FDA under an Emergency Use Authorization (EUA). This EUA will remain in effect (meaning this test can be used) for the duration of the COVID-19 declaration under Section 564(b)(1) of the Act, 21 U.S.C. section 360bbb-3(b)(1), unless the authorization is terminated or revoked.     Resp Syncytial Virus by PCR NEGATIVE NEGATIVE Final    Comment: (NOTE) Fact Sheet for Patients: bloggercourse.com  Fact Sheet for Healthcare Providers: seriousbroker.it  This test is not yet approved or cleared by the [place]  FDA and has been authorized for detection and/or diagnosis of SARS-CoV-2 by FDA under an Emergency Use Authorization (EUA). This EUA will remain in effect (meaning this test can be used) for the duration of the COVID-19 declaration under Section 564(b)(1) of the Act, 21 U.S.C. section 360bbb-3(b)(1), unless the authorization is terminated or revoked.  Performed at [facility], [address], [place], [place] [address]          Radiology Studies: CT Head Wo Contrast Result Date: 09/18/2023 CLINICAL DATA:  Coffee-ground emesis, altered mental status EXAM: CT HEAD WITHOUT CONTRAST TECHNIQUE: Contiguous axial images were obtained from the base of the skull through the vertex without intravenous contrast. RADIATION DOSE REDUCTION: This exam was performed according to the departmental dose-optimization program which includes automated exposure control, adjustment of the mA and/or kV according to patient size and/or use of iterative reconstruction technique. COMPARISON:  08/12/2023 age  related cerebral atrophy. FINDINGS: Brain: No evidence of acute infarction, hemorrhage, mass, mass effect, or midline shift. No hydrocephalus or extra-axial fluid collection. Periventricular white matter changes, likely the sequela of chronic small vessel ischemic disease. Vascular: No hyperdense vessel. Skull: Negative for fracture or focal lesion. Sinuses/Orbits: Mucosal thickening throughout the paranasal sinuses. No acute finding in the orbits. Postsurgical changes in the globes. Other: The mastoid air cells are well aerated. IMPRESSION: No acute intracranial process. Electronically Signed   By: Donald Campion M.D.   On: 09/18/2023 18:49   CT ABDOMEN PELVIS W CONTRAST Result Date: 09/18/2023 CLINICAL DATA:  Coffee-ground emesis raising a concern for gastrointestinal bleeding. Abdominal distention. The patient reports no abdominal pain at this time. EXAM: CT ABDOMEN AND PELVIS WITH CONTRAST TECHNIQUE: Multidetector CT imaging of the abdomen and pelvis was performed using the standard protocol following bolus administration of intravenous contrast. RADIATION DOSE REDUCTION: This exam was performed according to the departmental dose-optimization program which includes automated exposure control, adjustment of the mA and/or kV according to patient size and/or use of iterative reconstruction technique. CONTRAST:  75mL OMNIPAQUE  IOHEXOL  300 MG/ML  SOLN COMPARISON:  01/23/2023 FINDINGS: Lower chest: Stable mildly enlarged  heart. Atheromatous calcifications, including the coronary arteries and aorta. Mild-to-moderate dependent atelectasis at both lung bases. Hepatobiliary: No focal liver abnormality is seen. No gallstones, gallbladder wall thickening, or biliary dilatation. Pancreas: Moderate to marked diffuse pancreatic atrophy. Spleen: Normal in size without focal abnormality. Adrenals/Urinary Tract: Adrenal glands are unremarkable. Kidneys are normal, without renal calculi, focal lesion, or hydronephrosis. Bladder  is unremarkable. Stomach/Bowel: Interval mildly dilated gas and fluid-filled proximal and mid small bowel with normal caliber distal small bowel. The transition point appears to be in the lower abdomen to the left of midline with no obstructing mass seen. The previously demonstrated small bowel loop within a small umbilical hernia containing fat is no longer in the hernia. The hernia only contains fat today. The stomach is distended. Normal caliber colon with the exception of interval mild dilatation of the rectum and distal sigmoid colon. Stool and fluid in both. The appendix is not identified. No secondary signs of appendicitis. Vascular/Lymphatic: Atheromatous arterial calcifications without aneurysm. No enlarged lymph nodes. Reproductive: Prostate is unremarkable. Other: Small umbilical and bilateral inguinal hernias containing fat. Musculoskeletal: Lumbar and lower thoracic spine degenerative changes with changes of DISH. Multiple lumbar and lower thoracic spine compression deformities without significant change. No new fractures or bony retropulsion. IMPRESSION: 1. Interval partial small bowel obstruction with the transition point in the lower abdomen to the left of midline. This is most likely due to adhesion formation. 2. Interval mild dilatation of the rectum and distal sigmoid colon with stool and fluid in both. 3. Stable mild cardiomegaly. 4.  Calcific coronary artery and aortic atherosclerosis. Aortic Atherosclerosis (ICD10-I70.0). Electronically Signed   By: Elspeth Bathe M.D.   On: 09/18/2023 16:47   DG Chest Port 1 View Result Date: 09/18/2023 CLINICAL DATA:  Shortness of breath and coffee-ground emesis EXAM: PORTABLE CHEST 1 VIEW COMPARISON:  08/14/2023 FINDINGS: Low lung volumes accentuate cardiomediastinal silhouette and pulmonary vascularity. Aortic atherosclerotic calcification. Bibasilar atelectasis/scarring. The lungs are otherwise clear. No pleural effusion or pneumothorax. IMPRESSION: Low  lung volumes with bibasilar atelectasis/scarring. Electronically Signed   By: Norman Gatlin M.D.   On: 09/18/2023 09:19        Scheduled Meds:  insulin  aspart  0-9 Units Subcutaneous Q4H   pantoprazole  (PROTONIX ) IV  40 mg Intravenous Q24H   Continuous Infusions:  dextrose  5 % and 0.9 % NaCl 50 mL/hr at 09/19/23 0609     LOS: 1 day    Time spent: 55 minutes    Abner Ardis JONETTA Fairly, DO [facility]  If 7PM-7AM, please contact night-coverage [email or web address] 09/19/2023, 7:06 AM

## 2023-09-20 ENCOUNTER — Encounter (HOSPITAL_COMMUNITY): Payer: Self-pay | Admitting: Internal Medicine

## 2023-09-20 DIAGNOSIS — K56609 Unspecified intestinal obstruction, unspecified as to partial versus complete obstruction: Secondary | ICD-10-CM | POA: Diagnosis not present

## 2023-09-20 LAB — GLUCOSE, CAPILLARY
Glucose-Capillary: 103 mg/dL — ABNORMAL HIGH (ref 70–99)
Glucose-Capillary: 103 mg/dL — ABNORMAL HIGH (ref 70–99)
Glucose-Capillary: 115 mg/dL — ABNORMAL HIGH (ref 70–99)
Glucose-Capillary: 143 mg/dL — ABNORMAL HIGH (ref 70–99)
Glucose-Capillary: 90 mg/dL (ref 70–99)
Glucose-Capillary: 94 mg/dL (ref 70–99)

## 2023-09-20 LAB — BASIC METABOLIC PANEL
Anion gap: 6 (ref 5–15)
BUN: 27 mg/dL — ABNORMAL HIGH (ref 8–23)
CO2: 27 mmol/L (ref 22–32)
Calcium: 8.2 mg/dL — ABNORMAL LOW (ref 8.9–10.3)
Chloride: 107 mmol/L (ref 98–111)
Creatinine, Ser: 1.26 mg/dL — ABNORMAL HIGH (ref 0.61–1.24)
GFR, Estimated: 55 mL/min — ABNORMAL LOW (ref >60)
Glucose, Bld: 102 mg/dL — ABNORMAL HIGH (ref 70–99)
Potassium: 3.9 mmol/L (ref 3.5–5.1)
Sodium: 140 mmol/L (ref 135–145)

## 2023-09-20 LAB — CBC
HCT: 35.1 % — ABNORMAL LOW (ref 39.0–52.0)
Hemoglobin: 10.8 g/dL — ABNORMAL LOW (ref 13.0–17.0)
MCH: 32.8 pg (ref 26.0–34.0)
MCHC: 30.8 g/dL (ref 30.0–36.0)
MCV: 106.7 fL — ABNORMAL HIGH (ref 80.0–100.0)
Platelets: 149 10*3/uL — ABNORMAL LOW (ref 150–400)
RBC: 3.29 MIL/uL — ABNORMAL LOW (ref 4.22–5.81)
RDW: 13 % (ref 11.5–15.5)
WBC: 5.3 10*3/uL (ref 4.0–10.5)
nRBC: 0 % (ref 0.0–0.2)

## 2023-09-20 LAB — MAGNESIUM: Magnesium: 1.9 mg/dL (ref 1.7–2.4)

## 2023-09-20 MED ORDER — BISACODYL 10 MG RE SUPP
10.0000 mg | Freq: Once | RECTAL | Status: AC
  Administered 2023-09-20: 10 mg via RECTAL
  Filled 2023-09-20: qty 1

## 2023-09-20 NOTE — Progress Notes (Signed)
 PROGRESS NOTE    Casey Cooley  FMW:[id number] DOB: June 30, 1933 DOA: 09/18/2023 PCP: [name] SQUIBB, MD   Brief Narrative:    Casey Cooley is a 89 y.o. male with medical history significant of hypertension, hyperlipidemia, COPD, CKD stage III, GERD, T2DM, atrial flutter on Eliquis  who presents to the emergency department from [facility] via EMS due to vomiting of coffee-ground emesis with shortness of breath this morning.  Patient was noted to have partial small bowel obstruction and general surgery consulted for evaluation.  Eliquis  has been held.  Assessment & Plan:   Principal Problem:   Small bowel obstruction (HCC) Active Problems:   Mixed hyperlipidemia   Essential hypertension   GERD without esophagitis   COPD (chronic obstructive pulmonary disease) (HCC)   Atrial flutter (HCC)   Chronic kidney disease, stage 3b (HCC)   Vomiting   Macrocytic anemia   Hypoalbuminemia due to protein-calorie malnutrition (HCC)   Diabetes mellitus type 2 in nonobese (HCC)   Chronic diastolic CHF (congestive heart failure) (HCC)   Tobacco use disorder  Assessment and Plan:   Small bowel obstruction No indication for NG tube at this time Continue NPO at this time with plan to advance diet as tolerated Continue IV hydration Continue IV morphine  2 mg q.4h p.r.n. for moderate/severe pain Continue IV Protonix  40 mg daily Continue Zofran  p.r.n. for nausea/vomiting Appreciate general surgery evaluation with plans to give suppository and clear liquid diet today   Vomiting with coffee-ground emesis Continue Zofran  as needed Hold Eliquis  and monitor CBC Currently with stable hemoglobin levels and no further vomiting   Macrocytic anemia MCV 102.3, hemoglobin 12.6 Folate and B12 within normal limits   CKD 3B Dehydration BUN/creatinine 33/1.75 (creatinine is within baseline range). Renally adjust medications, avoid nephrotoxic agents/dehydration/hypotension   Hypoalbuminemia possibly  due to mild protein calorie malnutrition Albumin 3.1, consider protein supplementation when SBO resolves   A flutter Rate is currently controlled, not on any AV blocking agents Apixaban  will be temporarily held pending surgical evaluation     Essential hypertension (controlled) No BP meds noted on med rec   Mixed hyperlipidemia Consider home statin when patient resumes oral intake   Diabetes mellitus, type II A1c 6.2% on 08/12/2023 Continue ISS and hypoglycemia protocol   GERD Continue Protonix    Chronic diastolic CHF Chronic lower extremity edema Most recent echo June 2024 with a preserved EF - 60-65% Lasix  temporarily held due to dehydration   Tobacco use disorder Still smoking cigar and pipe intermittently in the facility, continue nicotine  patch Patient was counseled on tobacco abuse cessation    DVT prophylaxis: SCDs Code Status: DNR Family Communication: None at bedside Disposition Plan:  Status is: Inpatient Remains inpatient appropriate because: Need for IV fluids and medications.   Consultants:  General surgery  Procedures:  None  Antimicrobials:  None   Subjective: Patient seen and evaluated today with no new acute complaints or concerns.  He states he is passing flatus and may have had a small bowel movement yesterday.  No nausea or vomiting.  He is tolerating clears.  Objective: Vitals:   09/19/23 1709 09/19/23 2047 09/20/23 0333 09/20/23 0433  BP: (!) 121/55 122/62 122/62 116/64  Pulse: 86 86 86 78  Resp: 16 17 17 18   Temp: 98 F (36.7 C) 97.7 F (36.5 C) 97.7 F (36.5 C) 98.7 F (37.1 C)  TempSrc:  Axillary Oral Oral  SpO2: 94% 94%  93%  Weight:   99.4 kg   Height:  6' 1 (1.854 m)     Intake/Output Summary (Last 24 hours) at 09/20/2023 1133 Last data filed at 09/20/2023 0756 Gross per 24 hour  Intake 403.61 ml  Output 1000 ml  Net -596.39 ml   Filed Weights   09/20/23 0333  Weight: 99.4 kg    Examination:  General exam:  Appears calm and comfortable  Respiratory system: Clear to auscultation. Respiratory effort normal.  Nasal cannula oxygen. Cardiovascular system: S1 & S2 heard, RRR.  Gastrointestinal system: Abdomen is soft Central nervous system: Alert and awake Extremities: No edema Skin: No significant lesions noted Psychiatry: Flat affect.    Data Reviewed: I have personally reviewed following labs and imaging studies  CBC: Recent Labs  Lab 09/18/23 0925 09/19/23 0316 09/19/23 0913 09/20/23 0451  WBC 7.7 6.3  --  5.3  NEUTROABS 6.0  --   --   --   HGB 12.9*  12.6* 11.6* 12.7* 10.8*  HCT 38.0*  40.8 38.8* 42.2 35.1*  MCV 102.3* 106.6*  --  106.7*  PLT 180 157  --  149*   Basic Metabolic Panel: Recent Labs  Lab 09/18/23 0925 09/19/23 0316 09/20/23 0451  NA 141  140 140 140  K 4.2  4.2 4.1 3.9  CL 102  101 106 107  CO2 28 27 27   GLUCOSE 124*  127* 85 102*  BUN 32*  33* 36* 27*  CREATININE 1.90*  1.75* 1.54* 1.26*  CALCIUM  8.8* 8.1* 8.2*  MG  --  2.0 1.9  PHOS  --  2.8  --    GFR: Estimated Creatinine Clearance: 50.3 mL/min (A) (by C-G formula based on SCr of 1.26 mg/dL (H)). Liver Function Tests: Recent Labs  Lab 09/18/23 0925 09/19/23 0316  AST 24 24  ALT 16 15  ALKPHOS 70 58  BILITOT 0.6 0.6  PROT 6.8 5.8*  ALBUMIN 3.1* 2.7*   No results for input(s): LIPASE, AMYLASE in the last 168 hours. No results for input(s): AMMONIA in the last 168 hours. Coagulation Profile: No results for input(s): INR, PROTIME in the last 168 hours. Cardiac Enzymes: No results for input(s): CKTOTAL, CKMB, CKMBINDEX, TROPONINI in the last 168 hours. BNP (last 3 results) No results for input(s): PROBNP in the last 8760 hours. HbA1C: No results for input(s): HGBA1C in the last 72 hours. CBG: Recent Labs  Lab 09/19/23 2052 09/20/23 0044 09/20/23 0536 09/20/23 0738 09/20/23 1119  GLUCAP 102* 103* 103* 94 90   Lipid Profile: No results for input(s):  CHOL, HDL, LDLCALC, TRIG, CHOLHDL, LDLDIRECT in the last 72 hours. Thyroid  Function Tests: No results for input(s): TSH, T4TOTAL, FREET4, T3FREE, THYROIDAB in the last 72 hours. Anemia Panel: Recent Labs    09/19/23 0316  VITAMINB12 366  FOLATE 30.2   Sepsis Labs: No results for input(s): PROCALCITON, LATICACIDVEN in the last 168 hours.  Recent Results (from the past 240 hours)  Resp panel by RT-PCR (RSV, Flu A&B, Covid) Anterior Nasal Swab     Status: None   Collection Time: 09/18/23  9:19 AM   Specimen: Anterior Nasal Swab  Result Value Ref Range Status   SARS Coronavirus 2 by RT PCR NEGATIVE NEGATIVE Final    Comment: (NOTE) SARS-CoV-2 target nucleic acids are NOT DETECTED.  The SARS-CoV-2 RNA is generally detectable in upper respiratory specimens during the acute phase of infection. The lowest concentration of SARS-CoV-2 viral copies this assay can detect is 138 copies/mL. A negative result does not preclude SARS-Cov-2 infection and should not be used  as the sole basis for treatment or other patient management decisions. A negative result may occur with  improper specimen collection/handling, submission of specimen other than nasopharyngeal swab, presence of viral mutation(s) within the areas targeted by this assay, and inadequate number of viral copies(<138 copies/mL). A negative result must be combined with clinical observations, patient history, and epidemiological information. The expected result is Negative.  Fact Sheet for Patients:  bloggercourse.com  Fact Sheet for Healthcare Providers:  seriousbroker.it  This test is no t yet approved or cleared by the [place]  FDA and  has been authorized for detection and/or diagnosis of SARS-CoV-2 by FDA under an Emergency Use Authorization (EUA). This EUA will remain  in effect (meaning this test can be used) for the duration of the COVID-19  declaration under Section 564(b)(1) of the Act, 21 U.S.C.section 360bbb-3(b)(1), unless the authorization is terminated  or revoked sooner.       Influenza A by PCR NEGATIVE NEGATIVE Final   Influenza B by PCR NEGATIVE NEGATIVE Final    Comment: (NOTE) The Xpert Xpress SARS-CoV-2/FLU/RSV plus assay is intended as an aid in the diagnosis of influenza from Nasopharyngeal swab specimens and should not be used as a sole basis for treatment. Nasal washings and aspirates are unacceptable for Xpert Xpress SARS-CoV-2/FLU/RSV testing.  Fact Sheet for Patients: bloggercourse.com  Fact Sheet for Healthcare Providers: seriousbroker.it  This test is not yet approved or cleared by the [place]  FDA and has been authorized for detection and/or diagnosis of SARS-CoV-2 by FDA under an Emergency Use Authorization (EUA). This EUA will remain in effect (meaning this test can be used) for the duration of the COVID-19 declaration under Section 564(b)(1) of the Act, 21 U.S.C. section 360bbb-3(b)(1), unless the authorization is terminated or revoked.     Resp Syncytial Virus by PCR NEGATIVE NEGATIVE Final    Comment: (NOTE) Fact Sheet for Patients: bloggercourse.com  Fact Sheet for Healthcare Providers: seriousbroker.it  This test is not yet approved or cleared by the [place]  FDA and has been authorized for detection and/or diagnosis of SARS-CoV-2 by FDA under an Emergency Use Authorization (EUA). This EUA will remain in effect (meaning this test can be used) for the duration of the COVID-19 declaration under Section 564(b)(1) of the Act, 21 U.S.C. section 360bbb-3(b)(1), unless the authorization is terminated or revoked.  Performed at [facility], [address] [place], [place] [address]          Radiology Studies: DG Abd 1 View Result Date: 09/19/2023 CLINICAL DATA:   Nausea/vomiting/abdominal discomfort, umbilical swelling EXAM: ABDOMEN - 1 VIEW COMPARISON:  07/04/2022, 09/18/2023 FINDINGS: Two supine frontal views of the abdomen and pelvis are obtained, excluding the hemidiaphragms by collimation. There is a relative paucity of small bowel gas. There is moderate gaseous distension of the sigmoid colon. No masses or abnormal calcifications. Excreted contrast within the urinary bladder. No acute bony abnormalities. IMPRESSION: 1. Paucity of small bowel gas, with distended fluid-filled loops of small bowel seen on prior CT not well visualized by x-ray. Electronically Signed   By: Ozell Daring M.D.   On: 09/19/2023 15:01   CT Head Wo Contrast Result Date: 09/18/2023 CLINICAL DATA:  Coffee-ground emesis, altered mental status EXAM: CT HEAD WITHOUT CONTRAST TECHNIQUE: Contiguous axial images were obtained from the base of the skull through the vertex without intravenous contrast. RADIATION DOSE REDUCTION: This exam was performed according to the departmental dose-optimization program which includes automated exposure control, adjustment of the mA and/or kV according to  patient size and/or use of iterative reconstruction technique. COMPARISON:  08/12/2023 age related cerebral atrophy. FINDINGS: Brain: No evidence of acute infarction, hemorrhage, mass, mass effect, or midline shift. No hydrocephalus or extra-axial fluid collection. Periventricular white matter changes, likely the sequela of chronic small vessel ischemic disease. Vascular: No hyperdense vessel. Skull: Negative for fracture or focal lesion. Sinuses/Orbits: Mucosal thickening throughout the paranasal sinuses. No acute finding in the orbits. Postsurgical changes in the globes. Other: The mastoid air cells are well aerated. IMPRESSION: No acute intracranial process. Electronically Signed   By: Donald Campion M.D.   On: 09/18/2023 18:49   CT ABDOMEN PELVIS W CONTRAST Result Date: 09/18/2023 CLINICAL DATA:  Coffee-ground  emesis raising a concern for gastrointestinal bleeding. Abdominal distention. The patient reports no abdominal pain at this time. EXAM: CT ABDOMEN AND PELVIS WITH CONTRAST TECHNIQUE: Multidetector CT imaging of the abdomen and pelvis was performed using the standard protocol following bolus administration of intravenous contrast. RADIATION DOSE REDUCTION: This exam was performed according to the departmental dose-optimization program which includes automated exposure control, adjustment of the mA and/or kV according to patient size and/or use of iterative reconstruction technique. CONTRAST:  75mL OMNIPAQUE  IOHEXOL  300 MG/ML  SOLN COMPARISON:  01/23/2023 FINDINGS: Lower chest: Stable mildly enlarged heart. Atheromatous calcifications, including the coronary arteries and aorta. Mild-to-moderate dependent atelectasis at both lung bases. Hepatobiliary: No focal liver abnormality is seen. No gallstones, gallbladder wall thickening, or biliary dilatation. Pancreas: Moderate to marked diffuse pancreatic atrophy. Spleen: Normal in size without focal abnormality. Adrenals/Urinary Tract: Adrenal glands are unremarkable. Kidneys are normal, without renal calculi, focal lesion, or hydronephrosis. Bladder is unremarkable. Stomach/Bowel: Interval mildly dilated gas and fluid-filled proximal and mid small bowel with normal caliber distal small bowel. The transition point appears to be in the lower abdomen to the left of midline with no obstructing mass seen. The previously demonstrated small bowel loop within a small umbilical hernia containing fat is no longer in the hernia. The hernia only contains fat today. The stomach is distended. Normal caliber colon with the exception of interval mild dilatation of the rectum and distal sigmoid colon. Stool and fluid in both. The appendix is not identified. No secondary signs of appendicitis. Vascular/Lymphatic: Atheromatous arterial calcifications without aneurysm. No enlarged lymph  nodes. Reproductive: Prostate is unremarkable. Other: Small umbilical and bilateral inguinal hernias containing fat. Musculoskeletal: Lumbar and lower thoracic spine degenerative changes with changes of DISH. Multiple lumbar and lower thoracic spine compression deformities without significant change. No new fractures or bony retropulsion. IMPRESSION: 1. Interval partial small bowel obstruction with the transition point in the lower abdomen to the left of midline. This is most likely due to adhesion formation. 2. Interval mild dilatation of the rectum and distal sigmoid colon with stool and fluid in both. 3. Stable mild cardiomegaly. 4.  Calcific coronary artery and aortic atherosclerosis. Aortic Atherosclerosis (ICD10-I70.0). Electronically Signed   By: Elspeth Bathe M.D.   On: 09/18/2023 16:47        Scheduled Meds:  bisacodyl   10 mg Rectal Once   docusate sodium   100 mg Oral BID   insulin  aspart  0-9 Units Subcutaneous Q4H   pantoprazole  (PROTONIX ) IV  40 mg Intravenous Q24H      LOS: 2 days    Time spent: 55 minutes    Lerry Cordrey JONETTA Fairly, DO [facility]  If 7PM-7AM, please contact night-coverage [email or web address] 09/20/2023, 11:33 AM

## 2023-09-20 NOTE — TOC Initial Note (Signed)
 Transition of Care [facility]) - Initial/Assessment Note    Patient Details  Name: Casey Cooley MRN: [id number] Date of Birth: July 29, 1933  [facility] CM/SW Contact:    Lorraine LILLETTE Fenton, LCSW Phone Number: 09/20/2023, 10:26 AM  Clinical Narrative:                 Pt is high risk for readmission. CSW completed assessment with pts niece.  Niece's grandmother is pt's sister.  Pt is widowed and has no children.  Niece shared that she is his financial POA and support. Pt has resided at [facility] for at least 3 years and plan is to return at [facility]. Pt able to complete some  ADLs. ALF staff assist with transportation to appointments, pt also can see their practitioner at facility if needed. Pt has a walker, lift chair, and wc if needed.  Niece did indicate ALF suggested a hospital bed be ordered for pt, and she believes there was a request about two weeks ago with PCP. Not sure on status- this is the only DME she feels is needed.  TOC to continue to follow.    Expected Discharge Plan: Assisted Living ([facility]) Barriers to Discharge: Continued Medical Work up   Patient Goals and CMS Choice Patient states their goals for this hospitalization and ongoing recovery are:: Return to ALF   Choice offered to / list presented to : [facility] POA / Guardian, Patient (Relative/niece)      Expected Discharge Plan and Services In-house Referral: Clinical Social Work     Living arrangements for the past 2 months: Assisted Living Facility                                      Prior Living Arrangements/Services Living arrangements for the past 2 months: Assisted Living Facility Lives with:: Other (Comment) (ALF) Patient language and need for interpreter reviewed:: Yes        Need for Family Participation in Patient Care: Yes (Comment) Care giver support system in place?: Yes (comment) (ALF)   Criminal Activity/Legal Involvement Pertinent to Current Situation/Hospitalization: No -  Comment as needed  Activities of Daily Living   ADL Screening (condition at time of admission) Independently performs ADLs?: No Does the patient have a NEW difficulty with bathing/dressing/toileting/self-feeding that is expected to last >3 days?: Yes (Initiates electronic notice to provider for possible OT consult) Does the patient have a NEW difficulty with getting in/out of bed, walking, or climbing stairs that is expected to last >3 days?: Yes (Initiates electronic notice to provider for possible PT consult) Does the patient have a NEW difficulty with communication that is expected to last >3 days?: No Is the patient deaf or have difficulty hearing?: No Does the patient have difficulty seeing, even when wearing glasses/contacts?: No Does the patient have difficulty concentrating, remembering, or making decisions?: No  Permission Sought/Granted                  Emotional Assessment              Admission diagnosis:  Small bowel obstruction (HCC) [K56.609] Patient Active Problem List   Diagnosis Date Noted   Small bowel obstruction (HCC) 09/18/2023   Vomiting 09/18/2023   Macrocytic anemia 09/18/2023   Hypoalbuminemia due to protein-calorie malnutrition (HCC) 09/18/2023   Diabetes mellitus type 2 in nonobese (HCC) 09/18/2023   Chronic diastolic CHF (  congestive heart failure) (HCC) 09/18/2023   Tobacco use disorder 09/18/2023   RSV (respiratory syncytial virus pneumonia) 08/12/2023   Leg swelling 01/29/2023   Syncope and collapse 01/23/2023   Cellulitis and abscess of leg 01/23/2023   Atrial flutter (HCC) 01/23/2023   Chronic kidney disease, stage 3b (HCC) 01/23/2023   COPD (chronic obstructive pulmonary disease) (HCC) 06/25/2020   Acute respiratory failure with hypoxia (HCC) 08/29/2019   Acute kidney injury superimposed on chronic kidney disease (HCC) 08/29/2019   Chronic non-seasonal allergic rhinitis 08/23/2019   Chronic constipation 08/23/2019   Major depression,  recurrent, chronic (HCC) 08/23/2019   Increased intraocular pressure, bilateral 08/23/2019   GERD without esophagitis 08/23/2019   Physical deconditioning    Pressure injury of skin 08/17/2019   Generalized weakness    Pneumonia due to COVID-19 virus 08/13/2019   Facet degeneration of lumbar region 03/28/2018   Mixed hyperlipidemia 05/31/2009   Essential hypertension 05/31/2009   PCP:  [name] SQUIBB, MD Pharmacy:   [facility] [place], [place] - [address] [place] [address] Phone: [phone] Fax: [phone]  [facility] [place], [place] - [place] [address] [place] [address] Phone: [phone] Fax: [phone]     Social Drivers of Health (SDOH) Social History: SDOH Screenings   Food Insecurity: No Food Insecurity (09/19/2023)  Housing: Low Risk  (09/19/2023)  Transportation Needs: No Transportation Needs (09/19/2023)  Utilities: Not At Risk (09/19/2023)  Financial Resource Strain: Low Risk  (12/12/2019)   Received from [facility]  Physical Activity: Inactive (12/12/2019)   Received from [facility], [facility]  Social Connections: Socially Isolated (09/19/2023)  Stress: Stress Concern Present (12/12/2019)   Received from [facility], [facility]  Tobacco Use: High Risk (09/20/2023)   SDOH Interventions:     Readmission Risk Interventions    09/20/2023   10:22 AM  Readmission Risk Prevention Plan  Transportation Screening Complete  PCP or Specialist Appt within 3-5 Days Complete  HRI or Home Care Consult Complete  Social Work Consult for Recovery Care Planning/Counseling Complete  Palliative Care Screening Not Applicable  Medication Review Oceanographer) Complete

## 2023-09-20 NOTE — Progress Notes (Signed)
 [facility] Progress Note     Subjective: Says having some flatus, no BM today. Tolerated sips and no nausea/vomiting.   Objective: Vital signs in last 24 hours: Temp:  [97.7 F (36.5 C)-98.7 F (37.1 C)] 98.7 F (37.1 C) (02/09 0433) Pulse Rate:  [64-86] 78 (02/09 0433) Resp:  [10-18] 18 (02/09 0433) BP: (116-148)/(55-81) 116/64 (02/09 0433) SpO2:  [92 %-97 %] 93 % (02/09 0433) Weight:  [99.4 kg] 99.4 kg (02/09 0333) Last BM Date : 09/18/23  Intake/Output from previous day: 02/08 0701 - 02/09 0700 In: 403.6 [I.V.:403.6] Out: 400 [Urine:400] Intake/Output this shift: Total I/O In: -  Out: 600 [Urine:600]  General appearance: alert and no distress GI: soft, minimally distended nontender   Lab Results:  Recent Labs    09/19/23 0316 09/19/23 0913 09/20/23 0451  WBC 6.3  --  5.3  HGB 11.6* 12.7* 10.8*  HCT 38.8* 42.2 35.1*  PLT 157  --  149*   BMET Recent Labs    09/19/23 0316 09/20/23 0451  NA 140 140  K 4.1 3.9  CL 106 107  CO2 27 27  GLUCOSE 85 102*  BUN 36* 27*  CREATININE 1.54* 1.26*  CALCIUM  8.1* 8.2*   PT/INR No results for input(s): LABPROT, INR in the last 72 hours.  Studies/Results: DG Abd 1 View Result Date: 09/19/2023 CLINICAL DATA:  Nausea/vomiting/abdominal discomfort, umbilical swelling EXAM: ABDOMEN - 1 VIEW COMPARISON:  07/04/2022, 09/18/2023 FINDINGS: Two supine frontal views of the abdomen and pelvis are obtained, excluding the hemidiaphragms by collimation. There is a relative paucity of small bowel gas. There is moderate gaseous distension of the sigmoid colon. No masses or abnormal calcifications. Excreted contrast within the urinary bladder. No acute bony abnormalities. IMPRESSION: 1. Paucity of small bowel gas, with distended fluid-filled loops of small bowel seen on prior CT not well visualized by x-ray. Electronically Signed   By: Ozell Daring M.D.   On: 09/19/2023 15:01   CT Head Wo Contrast Result Date:  09/18/2023 CLINICAL DATA:  Coffee-ground emesis, altered mental status EXAM: CT HEAD WITHOUT CONTRAST TECHNIQUE: Contiguous axial images were obtained from the base of the skull through the vertex without intravenous contrast. RADIATION DOSE REDUCTION: This exam was performed according to the departmental dose-optimization program which includes automated exposure control, adjustment of the mA and/or kV according to patient size and/or use of iterative reconstruction technique. COMPARISON:  08/12/2023 age related cerebral atrophy. FINDINGS: Brain: No evidence of acute infarction, hemorrhage, mass, mass effect, or midline shift. No hydrocephalus or extra-axial fluid collection. Periventricular white matter changes, likely the sequela of chronic small vessel ischemic disease. Vascular: No hyperdense vessel. Skull: Negative for fracture or focal lesion. Sinuses/Orbits: Mucosal thickening throughout the paranasal sinuses. No acute finding in the orbits. Postsurgical changes in the globes. Other: The mastoid air cells are well aerated. IMPRESSION: No acute intracranial process. Electronically Signed   By: Donald Campion M.D.   On: 09/18/2023 18:49   CT ABDOMEN PELVIS W CONTRAST Result Date: 09/18/2023 CLINICAL DATA:  Coffee-ground emesis raising a concern for gastrointestinal bleeding. Abdominal distention. The patient reports no abdominal pain at this time. EXAM: CT ABDOMEN AND PELVIS WITH CONTRAST TECHNIQUE: Multidetector CT imaging of the abdomen and pelvis was performed using the standard protocol following bolus administration of intravenous contrast. RADIATION DOSE REDUCTION: This exam was performed according to the departmental dose-optimization program which includes automated exposure control, adjustment of the mA and/or kV according to patient size and/or use of iterative reconstruction technique.  CONTRAST:  75mL OMNIPAQUE  IOHEXOL  300 MG/ML  SOLN COMPARISON:  01/23/2023 FINDINGS: Lower chest: Stable mildly  enlarged heart. Atheromatous calcifications, including the coronary arteries and aorta. Mild-to-moderate dependent atelectasis at both lung bases. Hepatobiliary: No focal liver abnormality is seen. No gallstones, gallbladder wall thickening, or biliary dilatation. Pancreas: Moderate to marked diffuse pancreatic atrophy. Spleen: Normal in size without focal abnormality. Adrenals/Urinary Tract: Adrenal glands are unremarkable. Kidneys are normal, without renal calculi, focal lesion, or hydronephrosis. Bladder is unremarkable. Stomach/Bowel: Interval mildly dilated gas and fluid-filled proximal and mid small bowel with normal caliber distal small bowel. The transition point appears to be in the lower abdomen to the left of midline with no obstructing mass seen. The previously demonstrated small bowel loop within a small umbilical hernia containing fat is no longer in the hernia. The hernia only contains fat today. The stomach is distended. Normal caliber colon with the exception of interval mild dilatation of the rectum and distal sigmoid colon. Stool and fluid in both. The appendix is not identified. No secondary signs of appendicitis. Vascular/Lymphatic: Atheromatous arterial calcifications without aneurysm. No enlarged lymph nodes. Reproductive: Prostate is unremarkable. Other: Small umbilical and bilateral inguinal hernias containing fat. Musculoskeletal: Lumbar and lower thoracic spine degenerative changes with changes of DISH. Multiple lumbar and lower thoracic spine compression deformities without significant change. No new fractures or bony retropulsion. IMPRESSION: 1. Interval partial small bowel obstruction with the transition point in the lower abdomen to the left of midline. This is most likely due to adhesion formation. 2. Interval mild dilatation of the rectum and distal sigmoid colon with stool and fluid in both. 3. Stable mild cardiomegaly. 4.  Calcific coronary artery and aortic atherosclerosis. Aortic  Atherosclerosis (ICD10-I70.0). Electronically Signed   By: Elspeth Bathe M.D.   On: 09/18/2023 16:47    Anti-infectives: Anti-infectives (From admission, onward)    None       Assessment/Plan: Patient with improving symptoms and BM yesterday, flatus today. Wants to try to eat something. Told him to go slow.  Clear diet  Colace and dulcolax    LOS: 2 days    Manuelita JAYSON Pander 09/20/2023

## 2023-09-21 ENCOUNTER — Encounter (HOSPITAL_COMMUNITY): Payer: Self-pay | Admitting: Internal Medicine

## 2023-09-21 ENCOUNTER — Inpatient Hospital Stay (HOSPITAL_COMMUNITY): Payer: Medicare Other

## 2023-09-21 DIAGNOSIS — Z7189 Other specified counseling: Secondary | ICD-10-CM

## 2023-09-21 DIAGNOSIS — K56609 Unspecified intestinal obstruction, unspecified as to partial versus complete obstruction: Secondary | ICD-10-CM | POA: Diagnosis not present

## 2023-09-21 DIAGNOSIS — Z515 Encounter for palliative care: Secondary | ICD-10-CM

## 2023-09-21 LAB — GLUCOSE, CAPILLARY
Glucose-Capillary: 100 mg/dL — ABNORMAL HIGH (ref 70–99)
Glucose-Capillary: 124 mg/dL — ABNORMAL HIGH (ref 70–99)
Glucose-Capillary: 145 mg/dL — ABNORMAL HIGH (ref 70–99)
Glucose-Capillary: 80 mg/dL (ref 70–99)
Glucose-Capillary: 92 mg/dL (ref 70–99)
Glucose-Capillary: 94 mg/dL (ref 70–99)

## 2023-09-21 LAB — CBC
HCT: 35 % — ABNORMAL LOW (ref 39.0–52.0)
Hemoglobin: 10.6 g/dL — ABNORMAL LOW (ref 13.0–17.0)
MCH: 31.3 pg (ref 26.0–34.0)
MCHC: 30.3 g/dL (ref 30.0–36.0)
MCV: 103.2 fL — ABNORMAL HIGH (ref 80.0–100.0)
Platelets: 152 10*3/uL (ref 150–400)
RBC: 3.39 MIL/uL — ABNORMAL LOW (ref 4.22–5.81)
RDW: 12.8 % (ref 11.5–15.5)
WBC: 6.9 10*3/uL (ref 4.0–10.5)
nRBC: 0 % (ref 0.0–0.2)

## 2023-09-21 LAB — BASIC METABOLIC PANEL
Anion gap: 5 (ref 5–15)
BUN: 19 mg/dL (ref 8–23)
CO2: 25 mmol/L (ref 22–32)
Calcium: 8.2 mg/dL — ABNORMAL LOW (ref 8.9–10.3)
Chloride: 108 mmol/L (ref 98–111)
Creatinine, Ser: 1.13 mg/dL (ref 0.61–1.24)
GFR, Estimated: >60 mL/min (ref >60)
Glucose, Bld: 84 mg/dL (ref 70–99)
Potassium: 4 mmol/L (ref 3.5–5.1)
Sodium: 138 mmol/L (ref 135–145)

## 2023-09-21 LAB — MAGNESIUM: Magnesium: 1.8 mg/dL (ref 1.7–2.4)

## 2023-09-21 MED ORDER — BISACODYL 10 MG RE SUPP
10.0000 mg | Freq: Once | RECTAL | Status: AC
  Administered 2023-09-21: 10 mg via RECTAL
  Filled 2023-09-21: qty 1

## 2023-09-21 NOTE — Plan of Care (Signed)
  Problem: Coping: Goal: Level of anxiety will decrease Outcome: Progressing   Problem: Pain Managment: Goal: General experience of comfort will improve and/or be controlled Outcome: Progressing   Problem: Skin Integrity: Goal: Risk for impaired skin integrity will decrease Outcome: Progressing

## 2023-09-21 NOTE — Progress Notes (Signed)
 [facility] Progress Note     Subjective: Kub with gas in small bowel and colon, nonspecific, no obvious signs of obstruction. No nausea with clears but no BM reported by patient.   Objective: Vital signs in last 24 hours: Temp:  [97.6 F (36.4 C)-98.5 F (36.9 C)] 97.6 F (36.4 C) (02/10 0529) Pulse Rate:  [66-86] 84 (02/10 0529) Resp:  [16-20] 20 (02/10 0529) BP: (107-124)/(54-79) 124/79 (02/10 0529) SpO2:  [96 %-97 %] 96 % (02/10 0529) Last BM Date : 09/20/23  Intake/Output from previous day: 02/09 0701 - 02/10 0700 In: 480 [P.O.:480] Out: 1200 [Urine:1200] Intake/Output this shift: Total I/O In: 420 [P.O.:420] Out: -   General appearance: alert and no distress GI: soft, mildly distended, nontender   Lab Results:  Recent Labs    09/20/23 0451 09/21/23 0248  WBC 5.3 6.9  HGB 10.8* 10.6*  HCT 35.1* 35.0*  PLT 149* 152   BMET Recent Labs    09/20/23 0451 09/21/23 0248  NA 140 138  K 3.9 4.0  CL 107 108  CO2 27 25  GLUCOSE 102* 84  BUN 27* 19  CREATININE 1.26* 1.13  CALCIUM  8.2* 8.2*   PT/INR No results for input(s): "LABPROT", "INR" in the last 72 hours.  Studies/Results: DG Abd 1 View Result Date: 09/21/2023 CLINICAL DATA:  Vomiting EXAM: ABDOMEN - 1 VIEW COMPARISON:  09/19/2023 FINDINGS: Scattered large and small bowel gas is noted. A few mildly prominent loops of small bowel are noted in the left mid abdomen. No free air is noted. Degenerative changes of lumbar spine are seen. IMPRESSION: Mild small bowel dilatation. Electronically Signed   By: Violeta Grey M.D.   On: 09/21/2023 11:13   DG Abd 1 View Result Date: 09/19/2023 CLINICAL DATA:  Nausea/vomiting/abdominal discomfort, umbilical swelling EXAM: ABDOMEN - 1 VIEW COMPARISON:  07/04/2022, 09/18/2023 FINDINGS: Two supine frontal views of the abdomen and pelvis are obtained, excluding the hemidiaphragms by collimation. There is a relative paucity of small bowel gas. There is  moderate gaseous distension of the sigmoid colon. No masses or abnormal calcifications. Excreted contrast within the urinary bladder. No acute bony abnormalities. IMPRESSION: 1. Paucity of small bowel gas, with distended fluid-filled loops of small bowel seen on prior CT not well visualized by x-ray. Electronically Signed   By: Bobbye Burrow M.D.   On: 09/19/2023 15:01    Anti-infectives: Anti-infectives (From admission, onward)    None       Assessment/Plan: Patient with SBO that is resolving. KUB reassuring. Full liquids ordered  Will adv to soft for AM if tolerates Dulcolax to see if helps stimulate BM   LOS: 3 days    Awilda Bogus 09/21/2023

## 2023-09-21 NOTE — Progress Notes (Signed)
   09/21/23 1449  Spiritual Encounters  Type of Visit Initial  Care provided to: Patient  Referral source Patient request  Reason for visit Routine spiritual support  OnCall Visit No  Spiritual Framework  Presenting Themes Meaning/purpose/sources of inspiration;Rituals and practive  Patient Stress Factors None identified  Family Stress Factors None identified  Interventions  Spiritual Care Interventions Made Compassionate presence;Prayer  Intervention Outcomes  Outcomes Connection to spiritual care  Spiritual Care Plan  Spiritual Care Issues Still Outstanding Chaplain will continue to follow   Found patient asleep and hard to rouse this afternoon. Chaplain provided quiet prayer bedside and spiritual presence. Will continue to follow in order to provide spiritual support and to assess for spiritual need.   Rev. Roshan Roback Vergia Glasgow.Div Chaplain

## 2023-09-21 NOTE — Progress Notes (Signed)
 PROGRESS NOTE    Casey Cooley  ZHY:[id number] DOB: 1932-11-15 DOA: 09/18/2023 PCP: Deedra Farr, MD   Brief Narrative:    Casey Cooley is a 89 y.o. male with medical history significant of hypertension, hyperlipidemia, COPD, CKD stage III, GERD, T2DM, atrial flutter on Eliquis  who presents to the emergency department from [facility] via EMS due to vomiting of coffee-ground emesis with shortness of breath this morning.  Patient was noted to have partial small bowel obstruction and general surgery consulted for evaluation.  Eliquis  has been held.  Assessment & Plan:   Principal Problem:   Small bowel obstruction (HCC) Active Problems:   Mixed hyperlipidemia   Essential hypertension   GERD without esophagitis   COPD (chronic obstructive pulmonary disease) (HCC)   Atrial flutter (HCC)   Chronic kidney disease, stage 3b (HCC)   Vomiting   Macrocytic anemia   Hypoalbuminemia due to protein-calorie malnutrition (HCC)   Diabetes mellitus type 2 in nonobese (HCC)   Chronic diastolic CHF (congestive heart failure) (HCC)   Tobacco use disorder  Assessment and Plan:   Small bowel obstruction No indication for NG tube at this time Continue NPO at this time with plan to advance diet as tolerated Continue IV hydration Continue IV morphine  2 mg q.4h p.r.n. for moderate/severe pain Continue IV Protonix  40 mg daily Continue Zofran  p.r.n. for nausea/vomiting Appreciate general surgery evaluation with plans to repeat KUB as well as give another suppository and advance from clear liquid diet if improved.   Vomiting with coffee-ground emesis Continue Zofran  as needed Hold Eliquis  and monitor CBC Currently with stable hemoglobin levels and no further vomiting   Macrocytic anemia MCV 102.3, hemoglobin 12.6 Folate and B12 within normal limits   CKD 3B Dehydration BUN/creatinine 33/1.75 (creatinine is within baseline range). Renally adjust medications, avoid nephrotoxic  agents/dehydration/hypotension   Hypoalbuminemia possibly due to mild protein calorie malnutrition Albumin 3.1, consider protein supplementation when SBO resolves   A flutter Rate is currently controlled, not on any AV blocking agents Apixaban  will be temporarily held pending surgical evaluation     Essential hypertension (controlled) No BP meds noted on med rec   Mixed hyperlipidemia Consider home statin when patient resumes oral intake   Diabetes mellitus, type II A1c 6.2% on 08/12/2023 Continue ISS and hypoglycemia protocol   GERD Continue Protonix    Chronic diastolic CHF Chronic lower extremity edema Most recent echo June 2024 with a preserved EF - 60-65% Lasix  temporarily held due to dehydration   Tobacco use disorder Still smoking cigar and pipe intermittently in the facility, continue nicotine  patch Patient was counseled on tobacco abuse cessation    DVT prophylaxis: SCDs Code Status: DNR Family Communication: None at bedside Disposition Plan:  Status is: Inpatient Remains inpatient appropriate because: Need for IV fluids and medications.   Consultants:  General surgery  Procedures:  None  Antimicrobials:  None   Subjective: Patient seen and evaluated today with no new acute complaints or concerns.  He appears to be tolerating clear liquid diet.  Denies any bowel movement.  Objective: Vitals:   09/20/23 0433 09/20/23 1324 09/20/23 2008 09/21/23 0529  BP: 116/64 (!) 107/54 (!) 116/58 124/79  Pulse: 78 86 66 84  Resp: 18 16 17 20   Temp: 98.7 F (37.1 C)  98.5 F (36.9 C) 97.6 F (36.4 C)  TempSrc: Oral  Oral Oral  SpO2: 93% 97% 96% 96%  Weight:      Height:  Intake/Output Summary (Last 24 hours) at 09/21/2023 1053 Last data filed at 09/21/2023 0900 Gross per 24 hour  Intake 900 ml  Output 600 ml  Net 300 ml   Filed Weights   09/20/23 0333  Weight: 99.4 kg    Examination:  General exam: Appears calm and comfortable  Respiratory  system: Clear to auscultation. Respiratory effort normal.  Nasal cannula oxygen. Cardiovascular system: S1 & S2 heard, RRR.  Gastrointestinal system: Abdomen is soft Central nervous system: Alert and awake Extremities: No edema Skin: No significant lesions noted Psychiatry: Flat affect.    Data Reviewed: I have personally reviewed following labs and imaging studies  CBC: Recent Labs  Lab 09/18/23 0925 09/19/23 0316 09/19/23 0913 09/20/23 0451 09/21/23 0248  WBC 7.7 6.3  --  5.3 6.9  NEUTROABS 6.0  --   --   --   --   HGB 12.9*  12.6* 11.6* 12.7* 10.8* 10.6*  HCT 38.0*  40.8 38.8* 42.2 35.1* 35.0*  MCV 102.3* 106.6*  --  106.7* 103.2*  PLT 180 157  --  149* 152   Basic Metabolic Panel: Recent Labs  Lab 09/18/23 0925 09/19/23 0316 09/20/23 0451 09/21/23 0248  NA 141  140 140 140 138  K 4.2  4.2 4.1 3.9 4.0  CL 102  101 106 107 108  CO2 28 27 27 25   GLUCOSE 124*  127* 85 102* 84  BUN 32*  33* 36* 27* 19  CREATININE 1.90*  1.75* 1.54* 1.26* 1.13  CALCIUM  8.8* 8.1* 8.2* 8.2*  MG  --  2.0 1.9 1.8  PHOS  --  2.8  --   --    GFR: Estimated Creatinine Clearance: 56.1 mL/min (by C-G formula based on SCr of 1.13 mg/dL). Liver Function Tests: Recent Labs  Lab 09/18/23 0925 09/19/23 0316  AST 24 24  ALT 16 15  ALKPHOS 70 58  BILITOT 0.6 0.6  PROT 6.8 5.8*  ALBUMIN 3.1* 2.7*   No results for input(s): "LIPASE", "AMYLASE" in the last 168 hours. No results for input(s): "AMMONIA" in the last 168 hours. Coagulation Profile: No results for input(s): "INR", "PROTIME" in the last 168 hours. Cardiac Enzymes: No results for input(s): "CKTOTAL", "CKMB", "CKMBINDEX", "TROPONINI" in the last 168 hours. BNP (last 3 results) No results for input(s): "PROBNP" in the last 8760 hours. HbA1C: No results for input(s): "HGBA1C" in the last 72 hours. CBG: Recent Labs  Lab 09/20/23 1629 09/20/23 2010 09/21/23 0047 09/21/23 0629 09/21/23 0731  GLUCAP 143* 115* 92 100*  94   Lipid Profile: No results for input(s): "CHOL", "HDL", "LDLCALC", "TRIG", "CHOLHDL", "LDLDIRECT" in the last 72 hours. Thyroid  Function Tests: No results for input(s): "TSH", "T4TOTAL", "FREET4", "T3FREE", "THYROIDAB" in the last 72 hours. Anemia Panel: Recent Labs    09/19/23 0316  VITAMINB12 366  FOLATE 30.2   Sepsis Labs: No results for input(s): "PROCALCITON", "LATICACIDVEN" in the last 168 hours.  Recent Results (from the past 240 hours)  Resp panel by RT-PCR (RSV, Flu A&B, Covid) Anterior Nasal Swab     Status: None   Collection Time: 09/18/23  9:19 AM   Specimen: Anterior Nasal Swab  Result Value Ref Range Status   SARS Coronavirus 2 by RT PCR NEGATIVE NEGATIVE Final    Comment: (NOTE) SARS-CoV-2 target nucleic acids are NOT DETECTED.  The SARS-CoV-2 RNA is generally detectable in upper respiratory specimens during the acute phase of infection. The lowest concentration of SARS-CoV-2 viral copies this assay can detect is 138  copies/mL. A negative result does not preclude SARS-Cov-2 infection and should not be used as the sole basis for treatment or other patient management decisions. A negative result may occur with  improper specimen collection/handling, submission of specimen other than nasopharyngeal swab, presence of viral mutation(s) within the areas targeted by this assay, and inadequate number of viral copies(<138 copies/mL). A negative result must be combined with clinical observations, patient history, and epidemiological information. The expected result is Negative.  Fact Sheet for Patients:  BloggerCourse.com  Fact Sheet for Healthcare Providers:  SeriousBroker.it  This test is no t yet approved or cleared by the [place]  FDA and  has been authorized for detection and/or diagnosis of SARS-CoV-2 by FDA under an Emergency Use Authorization (EUA). This EUA will remain  in effect (meaning this test  can be used) for the duration of the COVID-19 declaration under Section 564(b)(1) of the Act, 21 U.S.C.section 360bbb-3(b)(1), unless the authorization is terminated  or revoked sooner.       Influenza A by PCR NEGATIVE NEGATIVE Final   Influenza B by PCR NEGATIVE NEGATIVE Final    Comment: (NOTE) The Xpert Xpress SARS-CoV-2/FLU/RSV plus assay is intended as an aid in the diagnosis of influenza from Nasopharyngeal swab specimens and should not be used as a sole basis for treatment. Nasal washings and aspirates are unacceptable for Xpert Xpress SARS-CoV-2/FLU/RSV testing.  Fact Sheet for Patients: BloggerCourse.com  Fact Sheet for Healthcare Providers: SeriousBroker.it  This test is not yet approved or cleared by the [place]  FDA and has been authorized for detection and/or diagnosis of SARS-CoV-2 by FDA under an Emergency Use Authorization (EUA). This EUA will remain in effect (meaning this test can be used) for the duration of the COVID-19 declaration under Section 564(b)(1) of the Act, 21 U.S.C. section 360bbb-3(b)(1), unless the authorization is terminated or revoked.     Resp Syncytial Virus by PCR NEGATIVE NEGATIVE Final    Comment: (NOTE) Fact Sheet for Patients: BloggerCourse.com  Fact Sheet for Healthcare Providers: SeriousBroker.it  This test is not yet approved or cleared by the [place]  FDA and has been authorized for detection and/or diagnosis of SARS-CoV-2 by FDA under an Emergency Use Authorization (EUA). This EUA will remain in effect (meaning this test can be used) for the duration of the COVID-19 declaration under Section 564(b)(1) of the Act, 21 U.S.C. section 360bbb-3(b)(1), unless the authorization is terminated or revoked.  Performed at [facility], [address], [place], [place] [address]          Radiology Studies: DG Abd 1  View Result Date: 09/19/2023 CLINICAL DATA:  Nausea/vomiting/abdominal discomfort, umbilical swelling EXAM: ABDOMEN - 1 VIEW COMPARISON:  07/04/2022, 09/18/2023 FINDINGS: Two supine frontal views of the abdomen and pelvis are obtained, excluding the hemidiaphragms by collimation. There is a relative paucity of small bowel gas. There is moderate gaseous distension of the sigmoid colon. No masses or abnormal calcifications. Excreted contrast within the urinary bladder. No acute bony abnormalities. IMPRESSION: 1. Paucity of small bowel gas, with distended fluid-filled loops of small bowel seen on prior CT not well visualized by x-ray. Electronically Signed   By: Bobbye Burrow M.D.   On: 09/19/2023 15:01        Scheduled Meds:  bisacodyl   10 mg Rectal Once   docusate sodium   100 mg Oral BID   insulin  aspart  0-9 Units Subcutaneous Q4H   pantoprazole  (PROTONIX ) IV  40 mg Intravenous Q24H      LOS: 3 days  Time spent: 55 minutes    Ebb Carelock Loran Rock, DO [facility]  If 7PM-7AM, please contact night-coverage [email or web address] 09/21/2023, 10:53 AM

## 2023-09-21 NOTE — Consult Note (Signed)
 Consultation Note Date: 09/21/2023   Patient Name: Casey Cooley  DOB: 22-May-1933  MRN: [id number]  Age / Sex: 89 y.o., male  PCP: Deedra Farr, MD Referring Physician: Cornelius Dill, DO  Reason for Consultation: Establishing goals of care  HPI/Patient Profile: 89 y.o. male  with past medical history of HTN/HLD, COPD, CKD 3, DM2, GERD, A-flutter on Eliquis , BPH, admitted on 09/18/2023 with small bowel obstruction with general surgery evaluation.   Clinical Assessment and Goals of Care: I have reviewed medical records including EPIC notes, labs and imaging, received report from RN, assessed the patient.  Casey Cooley is lying quietly in bed.  He appears acutely/chronically ill and somewhat frail.  He greets me, making and mostly keeping eye contact.  He is alert and oriented, able to make his needs known.  There is no family at bedside at this time.   We meet at the bedside to discuss diagnosis prognosis, GOC, EOL wishes, disposition and options. I introduced Palliative Medicine as specialized medical care for people living with serious illness. It focuses on providing relief from the symptoms and stress of a serious illness. The goal is to improve quality of life for both the patient and the family.  We discussed a brief life review of the patient Casey Cooley tells me that he has been at [facility] for many years.  He tells me that he is a widower, he had 1 son who is deceased.    We then focused on their current illness. The natural disease trajectory and expectations at EOL were discussed.  Advanced directives, concepts specific to code status, artifical feeding and hydration, and rehospitalization were considered and discussed.  DNR.  Discussed the importance of continued conversation with family and the medical providers regarding overall plan of care and treatment options, ensuring decisions are within  the context of the patient's values and GOCs.  Questions and concerns were addressed.  The family was encouraged to call with questions or concerns.  PMT will continue to support holistically.  Conference with attending, surgeon, bedside nursing staff, transition of care team related to patient condition, needs, goals of care, disposition.   HCPOA  NEXT OF KIN -niece, Jarrius Schoff.  Casey Cooley states he also leans on his sister for help and support.    SUMMARY OF RECOMMENDATIONS   Continue to treat the treatable but no CPR or intubation.   Time for outcomes.   May qualify for short-term rehab   Code Status/Advance Care Planning: DNR  Symptom Management:  Per hospitalist no additional needs at this time.  Palliative Prophylaxis:  Bowel Regimen, Frequent Pain Assessment, and Oral Care  Additional Recommendations (Limitations, Scope, Preferences): Continue to treat, time for outcomes.  Psycho-social/Spiritual:  Desire for further Chaplaincy support:no Additional Recommendations: Caregiving  Support/Resources  Prognosis:  Unable to determine, based on outcomes.  Somewhat guarded at this point due to advanced age.  Discharge Planning: To be determined, based on outcomes and qualifications, may qualify for short-term rehab then return to [facility].       Primary Diagnoses: Present on Admission:  Small bowel obstruction (HCC)  Chronic kidney disease, stage 3b (HCC)  Atrial flutter (HCC)  Essential hypertension  Mixed hyperlipidemia  GERD without esophagitis  COPD (chronic obstructive pulmonary disease) (HCC)   I have reviewed the medical record, interviewed the patient and family, and examined the patient. The following aspects are pertinent.  Past Medical History:  Diagnosis Date   BPH (benign prostatic hyperplasia)    per MAR   CKD (chronic kidney disease)    COPD (chronic obstructive pulmonary disease) (HCC)    Depression    Diabetes mellitus without  complication (HCC)    GERD (gastroesophageal reflux disease)    Hypercholesterolemia    Hyperlipidemia    Hypertension    RETINAL DETACHMENT, BILATERAL, HX OF 05/31/2009   Qualifier: Diagnosis of  By: Cala Castleman     Social History   Socioeconomic History   Marital status: Widowed    Spouse name: Not on file   Number of children: Not on file   Years of education: Not on file   Highest education level: Not on file  Occupational History   Not on file  Tobacco Use   Smoking status: Some Days    Types: Cigarettes   Smokeless tobacco: Never  Vaping Use   Vaping status: Never Used  Substance and Sexual Activity   Alcohol use: Not Currently    Comment: occ   Drug use: Never   Sexual activity: Not on file  Other Topics Concern   Not on file  Social History Narrative   Not on file   Social Drivers of Health   Financial Resource Strain: Low Risk  (12/12/2019)   Received from [facility], [facility]   Overall Financial Resource Strain (CARDIA)    Difficulty of Paying Living Expenses: Not hard at all  Food Insecurity: No Food Insecurity (09/19/2023)   Hunger Vital Sign    Worried About Running Out of Food in the Last Year: Never true    Ran Out of Food in the Last Year: Never true  Transportation Needs: No Transportation Needs (09/19/2023)   PRAPARE - Administrator, Civil Service (Medical): No    Lack of Transportation (Non-Medical): No  Physical Activity: Inactive (12/12/2019)   Received from [facility], [facility]   Exercise Vital Sign    Days of Exercise per Week: 0 days    Minutes of Exercise per Session: 0 min  Stress: Stress Concern Present (12/12/2019)   Received from [facility], [facility] - Occupational Stress Questionnaire    Feeling of Stress : Very much  Social Connections: Socially Isolated (09/19/2023)   Social Connection and Isolation Panel [NHANES]    Frequency of Communication with Friends  and Family: Once a week    Frequency of Social Gatherings with Friends and Family: Never    Attends Religious Services: Never    Database administrator or Organizations: No    Attends Banker Meetings: Never    Marital Status: Widowed   History reviewed. No pertinent family history. Scheduled Meds:  docusate sodium   100 mg Oral BID   insulin  aspart  0-9 Units Subcutaneous Q4H   pantoprazole  (PROTONIX ) IV  40 mg Intravenous Q24H   Continuous Infusions: PRN Meds:.acetaminophen  **OR** acetaminophen , morphine  injection, ondansetron  **OR** ondansetron  (ZOFRAN ) IV Medications Prior to Admission:  Prior to Admission medications   Medication Sig Start Date  End Date Taking? Authorizing Provider  acetaminophen  (TYLENOL ) 325 MG tablet Take 650 mg by mouth 2 (two) times daily.   Yes [provider]  apixaban  (ELIQUIS ) 2.5 MG TABS tablet Take 1 tablet (2.5 mg total) by mouth 2 (two) times daily. 08/14/23  Yes Johnson, Clanford L, MD  benzocaine (ORAJEL) 10 % mucosal gel Use as directed 1 Application in the mouth or throat 3 (three) times daily. Before each meal.   Yes [provider]  budesonide-formoterol  (SYMBICORT) 160-4.5 MCG/ACT inhaler Inhale 2 puffs into the lungs 2 (two) times daily.   Yes [provider]  calcium  carbonate (OSCAL) 1500 (600 Ca) MG TABS tablet Take 600 mg of elemental calcium  by mouth daily.   Yes [provider]  cholecalciferol  (VITAMIN D3) 25 MCG (1000 UT) tablet Take 2,000 Units by mouth daily.   Yes [provider]  dextromethorphan  (DELSYM ) 30 MG/5ML liquid Take 5 mLs (30 mg total) by mouth 2 (two) times daily as needed for cough. 08/14/23  Yes Johnson, Clanford L, MD  docusate sodium  (COLACE) 100 MG capsule Take 100 mg by mouth daily.   Yes [provider]  escitalopram  (LEXAPRO ) 10 MG tablet Take 1 tablet (10 mg total) by mouth daily. Patient taking differently: Take 5 mg by mouth daily. 09/07/19  Yes  Sharion Davidson, MD  fluticasone  (FLONASE ) 50 MCG/ACT nasal spray Place 2 sprays into both nostrils daily as needed for allergies or rhinitis. 09/07/19  Yes Sharion Davidson, MD  furosemide  (LASIX ) 40 MG tablet Take 40 mg by mouth daily.   Yes [provider]  ipratropium-albuterol  (DUONEB) 0.5-2.5 (3) MG/3ML SOLN Take 3 mLs by nebulization every 4 (four) hours as needed (shortness of breath, cough, wheezing). 08/14/23  Yes Johnson, Clanford L, MD  lidocaine 4 % Place 1 patch onto the skin daily. Apply 1 patch to mid lower back every day for 12 hours on and 12 hours off.   Yes [provider]  loperamide (IMODIUM) 2 MG capsule Take 2 mg by mouth as needed for diarrhea or loose stools.   Yes [provider]  LORazepam  (ATIVAN ) 0.5 MG tablet Take 0.25 mg by mouth 2 (two) times daily. 08/21/23  Yes [provider]  Melatonin 10 MG TABS Take 10 mg by mouth at bedtime.   Yes [provider]  Multiple Vitamin (MULTIVITAMIN WITH MINERALS) TABS tablet Take 1 tablet by mouth daily.   Yes [provider]  Omeprazole -Sodium Bicarbonate  (ZEGERID  OTC) 20-1100 MG CAPS capsule Take 1 capsule by mouth daily before breakfast. 09/07/19  Yes Sharion Davidson, MD  ondansetron  (ZOFRAN ) 4 MG tablet Take 4 mg by mouth every 8 (eight) hours as needed for nausea or vomiting.   Yes [provider]  polyethylene glycol (MIRALAX  / GLYCOLAX ) 17 g packet Take 17 g by mouth daily as needed for moderate constipation.   Yes [provider]  simvastatin  (ZOCOR ) 20 MG tablet Take 1 tablet (20 mg total) by mouth daily at 6 PM. 09/07/19  Yes Sharion Davidson, MD  sodium chloride  (MURO 128) 5 % ophthalmic solution Place 1 drop into both eyes 2 (two) times daily.   Yes [provider]  tamsulosin  (FLOMAX ) 0.4 MG CAPS capsule Take 0.4 mg by mouth daily.   Yes [provider]  zinc  oxide 20 % ointment Apply 1 Application topically daily as needed  (Redness).   Yes [provider]   No Known Allergies Review of Systems  Unable to  perform ROS: Age    Physical Exam Vitals and nursing note reviewed.  Constitutional:      General: He is not in acute distress.    Appearance: He is not ill-appearing.  HENT:     Mouth/Throat:     Mouth: Mucous membranes are moist.  Cardiovascular:     Rate and Rhythm: Normal rate.  Pulmonary:     Effort: Pulmonary effort is normal. No respiratory distress.  Skin:    General: Skin is warm and dry.  Neurological:     Mental Status: He is alert and oriented to person, place, and time.  Psychiatric:        Mood and Affect: Mood normal.        Behavior: Behavior normal.     Vital Signs: BP 124/79 (BP Location: Left Arm)   Pulse 84   Temp 97.6 F (36.4 C) (Oral)   Resp 20   Ht 6\' 1"  (1.854 m)   Wt 99.4 kg   SpO2 96%   BMI 28.91 kg/m  Pain Scale: 0-10   Pain Score: 0-No pain   SpO2: SpO2: 96 % O2 Device:SpO2: 96 % O2 Flow Rate: .O2 Flow Rate (L/min): 2 L/min  IO: Intake/output summary:  Intake/Output Summary (Last 24 hours) at 09/21/2023 1303 Last data filed at 09/21/2023 0900 Gross per 24 hour  Intake 900 ml  Output 600 ml  Net 300 ml    LBM: Last BM Date : 09/20/23 Baseline Weight: Weight: 99.4 kg Most recent weight: Weight: 99.4 kg     Palliative Assessment/Data:     Time In: 1000 Time Out: 1055 Time Total: 55 minutes  Greater than 50%  of this time was spent counseling and coordinating care related to the above assessment and plan.  Signed by: Annabelle Barrack, NP   Please contact Palliative Medicine Team phone at [phone] for questions and concerns.  For individual provider: See Tilford Foley

## 2023-09-22 DIAGNOSIS — Z7189 Other specified counseling: Secondary | ICD-10-CM | POA: Diagnosis not present

## 2023-09-22 DIAGNOSIS — Z515 Encounter for palliative care: Secondary | ICD-10-CM | POA: Diagnosis not present

## 2023-09-22 DIAGNOSIS — K56609 Unspecified intestinal obstruction, unspecified as to partial versus complete obstruction: Secondary | ICD-10-CM | POA: Diagnosis not present

## 2023-09-22 LAB — BASIC METABOLIC PANEL
Anion gap: 6 (ref 5–15)
BUN: 17 mg/dL (ref 8–23)
CO2: 25 mmol/L (ref 22–32)
Calcium: 8.4 mg/dL — ABNORMAL LOW (ref 8.9–10.3)
Chloride: 106 mmol/L (ref 98–111)
Creatinine, Ser: 1.24 mg/dL (ref 0.61–1.24)
GFR, Estimated: 56 mL/min — ABNORMAL LOW (ref >60)
Glucose, Bld: 95 mg/dL (ref 70–99)
Potassium: 3.6 mmol/L (ref 3.5–5.1)
Sodium: 137 mmol/L (ref 135–145)

## 2023-09-22 LAB — MAGNESIUM: Magnesium: 1.8 mg/dL (ref 1.7–2.4)

## 2023-09-22 LAB — GLUCOSE, CAPILLARY
Glucose-Capillary: 165 mg/dL — ABNORMAL HIGH (ref 70–99)
Glucose-Capillary: 177 mg/dL — ABNORMAL HIGH (ref 70–99)
Glucose-Capillary: 88 mg/dL (ref 70–99)
Glucose-Capillary: 89 mg/dL (ref 70–99)
Glucose-Capillary: 99 mg/dL (ref 70–99)

## 2023-09-22 MED ORDER — LORAZEPAM 0.5 MG PO TABS: 0.2500 mg | ORAL_TABLET | Freq: Two times a day (BID) | ORAL | 0 refills | Status: DC

## 2023-09-22 MED ORDER — ONDANSETRON HCL 4 MG PO TABS: 4.0000 mg | ORAL_TABLET | Freq: Four times a day (QID) | ORAL | 0 refills | Status: DC | PRN

## 2023-09-22 NOTE — Discharge Summary (Signed)
Physician Discharge Summary  Casey Cooley EAV:[id number] DOB: 05-22-1933 DOA: 09/18/2023  PCP: Galvin Proffer, MD  Admit date: 09/18/2023  Discharge date: 09/22/2023  Admitted From:ALF  Disposition:  ALF  Recommendations for Outpatient Follow-up:  Follow up with PCP in 1-2 weeks Continue on medications as noted below and resume Eliquis  Home Health: None  Equipment/Devices: None  Discharge Condition:Stable  CODE STATUS: DNR  Diet recommendation: Heart Healthy/carb modified  Brief/Interim Summary: Casey Cooley is a 89 y.o. male with medical history significant of hypertension, hyperlipidemia, COPD, CKD stage III, GERD, T2DM, atrial flutter on Eliquis who presents to the emergency department from [facility] via EMS due to vomiting of coffee-ground emesis with shortness of breath this morning.  Patient was noted to have partial small bowel obstruction and general surgery consulted for evaluation.  Eliquis had been held, but he spontaneously improved with conservative management and had slow dietary advancement and is now tolerating soft diet and having bowel movements.  No further nausea or vomiting or other symptoms noted.  He is now in stable condition for discharge back to his facility today.  Discharge Diagnoses:  Principal Problem:   Small bowel obstruction (HCC) Active Problems:   Mixed hyperlipidemia   Essential hypertension   GERD without esophagitis   COPD (chronic obstructive pulmonary disease) (HCC)   Atrial flutter (HCC)   Chronic kidney disease, stage 3b (HCC)   Vomiting   Macrocytic anemia   Hypoalbuminemia due to protein-calorie malnutrition (HCC)   Diabetes mellitus type 2 in nonobese (HCC)   Chronic diastolic CHF (congestive heart failure) (HCC)   Tobacco use disorder  Principal discharge diagnosis: Small bowel obstruction.  Discharge Instructions  Discharge Instructions     Diet - low sodium heart healthy   Complete by: As directed    If the  dressing is still on your incision site when you go home, remove it on the third day after your surgery date. Remove dressing if it begins to fall off, or if it is dirty or damaged before the third day.   Complete by: As directed    Increase activity slowly   Complete by: As directed       Allergies as of 09/22/2023   No Known Allergies      Medication List     TAKE these medications    acetaminophen 325 MG tablet Commonly known as: TYLENOL Take 650 mg by mouth 2 (two) times daily.   apixaban 2.5 MG Tabs tablet Commonly known as: ELIQUIS Take 1 tablet (2.5 mg total) by mouth 2 (two) times daily.   benzocaine 10 % mucosal gel Commonly known as: ORAJEL Use as directed 1 Application in the mouth or throat 3 (three) times daily. Before each meal.   budesonide-formoterol 160-4.5 MCG/ACT inhaler Commonly known as: SYMBICORT Inhale 2 puffs into the lungs 2 (two) times daily.   calcium carbonate 1500 (600 Ca) MG Tabs tablet Commonly known as: OSCAL Take 600 mg of elemental calcium by mouth daily.   cholecalciferol 25 MCG (1000 UNIT) tablet Commonly known as: VITAMIN D3 Take 2,000 Units by mouth daily.   dextromethorphan 30 MG/5ML liquid Commonly known as: Delsym Take 5 mLs (30 mg total) by mouth 2 (two) times daily as needed for cough.   docusate sodium 100 MG capsule Commonly known as: COLACE Take 100 mg by mouth daily.   escitalopram 10 MG tablet Commonly known as: LEXAPRO Take 1 tablet (10 mg total) by mouth daily. What changed: how much  to take   fluticasone 50 MCG/ACT nasal spray Commonly known as: FLONASE Place 2 sprays into both nostrils daily as needed for allergies or rhinitis.   furosemide 40 MG tablet Commonly known as: LASIX Take 40 mg by mouth daily.   ipratropium-albuterol 0.5-2.5 (3) MG/3ML Soln Commonly known as: DUONEB Take 3 mLs by nebulization every 4 (four) hours as needed (shortness of breath, cough, wheezing).   lidocaine 4 % Place 1  patch onto the skin daily. Apply 1 patch to mid lower back every day for 12 hours on and 12 hours off.   loperamide 2 MG capsule Commonly known as: IMODIUM Take 2 mg by mouth as needed for diarrhea or loose stools.   LORazepam 0.5 MG tablet Commonly known as: ATIVAN Take 0.5 tablets (0.25 mg total) by mouth 2 (two) times daily.   Melatonin 10 MG Tabs Take 10 mg by mouth at bedtime.   multivitamin with minerals Tabs tablet Take 1 tablet by mouth daily.   Omeprazole-Sodium Bicarbonate 20-1100 MG Caps capsule Commonly known as: Zegerid OTC Take 1 capsule by mouth daily before breakfast.   ondansetron 4 MG tablet Commonly known as: ZOFRAN Take 4 mg by mouth every 8 (eight) hours as needed for nausea or vomiting. What changed: Another medication with the same name was added. Make sure you understand how and when to take each.   ondansetron 4 MG tablet Commonly known as: ZOFRAN Take 1 tablet (4 mg total) by mouth every 6 (six) hours as needed for nausea. What changed: You were already taking a medication with the same name, and this prescription was added. Make sure you understand how and when to take each.   polyethylene glycol 17 g packet Commonly known as: MIRALAX / GLYCOLAX Take 17 g by mouth daily as needed for moderate constipation.   simvastatin 20 MG tablet Commonly known as: ZOCOR Take 1 tablet (20 mg total) by mouth daily at 6 PM.   sodium chloride 5 % ophthalmic solution Commonly known as: MURO 128 Place 1 drop into both eyes 2 (two) times daily.   tamsulosin 0.4 MG Caps capsule Commonly known as: FLOMAX Take 0.4 mg by mouth daily.   zinc oxide 20 % ointment Apply 1 Application topically daily as needed (Redness).               Discharge Care Instructions  (From admission, onward)           Start     Ordered   09/22/23 0000  If the dressing is still on your incision site when you go home, remove it on the third day after your surgery date. Remove  dressing if it begins to fall off, or if it is dirty or damaged before the third day.        09/22/23 0948            Follow-up Information     Hague, Myrene Galas, MD. Schedule an appointment as soon as possible for a visit in 1 week(s).   Specialty: Internal Medicine Contact information: [address] [place] [address] [phone]                No Known Allergies  Consultations: General surgery   Procedures/Studies: DG Abd 1 View Result Date: 09/21/2023 CLINICAL DATA:  Vomiting EXAM: ABDOMEN - 1 VIEW COMPARISON:  09/19/2023 FINDINGS: Scattered large and small bowel gas is noted. A few mildly prominent loops of small bowel are noted in the left mid abdomen.  No free air is noted. Degenerative changes of lumbar spine are seen. IMPRESSION: Mild small bowel dilatation. Electronically Signed   By: Alcide Clever M.D.   On: 09/21/2023 11:13   DG Abd 1 View Result Date: 09/19/2023 CLINICAL DATA:  Nausea/vomiting/abdominal discomfort, umbilical swelling EXAM: ABDOMEN - 1 VIEW COMPARISON:  07/04/2022, 09/18/2023 FINDINGS: Two supine frontal views of the abdomen and pelvis are obtained, excluding the hemidiaphragms by collimation. There is a relative paucity of small bowel gas. There is moderate gaseous distension of the sigmoid colon. No masses or abnormal calcifications. Excreted contrast within the urinary bladder. No acute bony abnormalities. IMPRESSION: 1. Paucity of small bowel gas, with distended fluid-filled loops of small bowel seen on prior CT not well visualized by x-ray. Electronically Signed   By: Sharlet Salina M.D.   On: 09/19/2023 15:01   CT Head Wo Contrast Result Date: 09/18/2023 CLINICAL DATA:  Coffee-ground emesis, altered mental status EXAM: CT HEAD WITHOUT CONTRAST TECHNIQUE: Contiguous axial images were obtained from the base of the skull through the vertex without intravenous contrast. RADIATION DOSE REDUCTION: This exam was performed according to the  departmental dose-optimization program which includes automated exposure control, adjustment of the mA and/or kV according to patient size and/or use of iterative reconstruction technique. COMPARISON:  08/12/2023 age related cerebral atrophy. FINDINGS: Brain: No evidence of acute infarction, hemorrhage, mass, mass effect, or midline shift. No hydrocephalus or extra-axial fluid collection. Periventricular white matter changes, likely the sequela of chronic small vessel ischemic disease. Vascular: No hyperdense vessel. Skull: Negative for fracture or focal lesion. Sinuses/Orbits: Mucosal thickening throughout the paranasal sinuses. No acute finding in the orbits. Postsurgical changes in the globes. Other: The mastoid air cells are well aerated. IMPRESSION: No acute intracranial process. Electronically Signed   By: Wiliam Ke M.D.   On: 09/18/2023 18:49   CT ABDOMEN PELVIS W CONTRAST Result Date: 09/18/2023 CLINICAL DATA:  Coffee-ground emesis raising a concern for gastrointestinal bleeding. Abdominal distention. The patient reports no abdominal pain at this time. EXAM: CT ABDOMEN AND PELVIS WITH CONTRAST TECHNIQUE: Multidetector CT imaging of the abdomen and pelvis was performed using the standard protocol following bolus administration of intravenous contrast. RADIATION DOSE REDUCTION: This exam was performed according to the departmental dose-optimization program which includes automated exposure control, adjustment of the mA and/or kV according to patient size and/or use of iterative reconstruction technique. CONTRAST:  75mL OMNIPAQUE IOHEXOL 300 MG/ML  SOLN COMPARISON:  01/23/2023 FINDINGS: Lower chest: Stable mildly enlarged heart. Atheromatous calcifications, including the coronary arteries and aorta. Mild-to-moderate dependent atelectasis at both lung bases. Hepatobiliary: No focal liver abnormality is seen. No gallstones, gallbladder wall thickening, or biliary dilatation. Pancreas: Moderate to marked  diffuse pancreatic atrophy. Spleen: Normal in size without focal abnormality. Adrenals/Urinary Tract: Adrenal glands are unremarkable. Kidneys are normal, without renal calculi, focal lesion, or hydronephrosis. Bladder is unremarkable. Stomach/Bowel: Interval mildly dilated gas and fluid-filled proximal and mid small bowel with normal caliber distal small bowel. The transition point appears to be in the lower abdomen to the left of midline with no obstructing mass seen. The previously demonstrated small bowel loop within a small umbilical hernia containing fat is no longer in the hernia. The hernia only contains fat today. The stomach is distended. Normal caliber colon with the exception of interval mild dilatation of the rectum and distal sigmoid colon. Stool and fluid in both. The appendix is not identified. No secondary signs of appendicitis. Vascular/Lymphatic: Atheromatous arterial calcifications without aneurysm. No enlarged lymph nodes.  Reproductive: Prostate is unremarkable. Other: Small umbilical and bilateral inguinal hernias containing fat. Musculoskeletal: Lumbar and lower thoracic spine degenerative changes with changes of DISH. Multiple lumbar and lower thoracic spine compression deformities without significant change. No new fractures or bony retropulsion. IMPRESSION: 1. Interval partial small bowel obstruction with the transition point in the lower abdomen to the left of midline. This is most likely due to adhesion formation. 2. Interval mild dilatation of the rectum and distal sigmoid colon with stool and fluid in both. 3. Stable mild cardiomegaly. 4.  Calcific coronary artery and aortic atherosclerosis. Aortic Atherosclerosis (ICD10-I70.0). Electronically Signed   By: Beckie Salts M.D.   On: 09/18/2023 16:47   DG Chest Port 1 View Result Date: 09/18/2023 CLINICAL DATA:  Shortness of breath and coffee-ground emesis EXAM: PORTABLE CHEST 1 VIEW COMPARISON:  08/14/2023 FINDINGS: Low lung volumes  accentuate cardiomediastinal silhouette and pulmonary vascularity. Aortic atherosclerotic calcification. Bibasilar atelectasis/scarring. The lungs are otherwise clear. No pleural effusion or pneumothorax. IMPRESSION: Low lung volumes with bibasilar atelectasis/scarring. Electronically Signed   By: Minerva Fester M.D.   On: 09/18/2023 09:19     Discharge Exam: Vitals:   09/21/23 2049 09/22/23 0416  BP: 125/84 (!) 155/78  Pulse: (!) 50 78  Resp: 20 20  Temp: 98.1 F (36.7 C) 98.4 F (36.9 C)  SpO2: 97% 95%   Vitals:   09/21/23 0529 09/21/23 1331 09/21/23 2049 09/22/23 0416  BP: 124/79 121/76 125/84 (!) 155/78  Pulse: 84 91 (!) 50 78  Resp: 20 18 20 20   Temp: 97.6 F (36.4 C) 98.1 F (36.7 C) 98.1 F (36.7 C) 98.4 F (36.9 C)  TempSrc: Oral Oral    SpO2: 96% 98% 97% 95%  Weight:      Height:        General: Pt is alert, awake, not in acute distress Cardiovascular: RRR, S1/S2 +, no rubs, no gallops Respiratory: CTA bilaterally, no wheezing, no rhonchi Abdominal: Soft, NT, ND, bowel sounds + Extremities: no edema, no cyanosis    The results of significant diagnostics from this hospitalization (including imaging, microbiology, ancillary and laboratory) are listed below for reference.     Microbiology: Recent Results (from the past 240 hours)  Resp panel by RT-PCR (RSV, Flu A&B, Covid) Anterior Nasal Swab     Status: None   Collection Time: 09/18/23  9:19 AM   Specimen: Anterior Nasal Swab  Result Value Ref Range Status   SARS Coronavirus 2 by RT PCR NEGATIVE NEGATIVE Final    Comment: (NOTE) SARS-CoV-2 target nucleic acids are NOT DETECTED.  The SARS-CoV-2 RNA is generally detectable in upper respiratory specimens during the acute phase of infection. The lowest concentration of SARS-CoV-2 viral copies this assay can detect is 138 copies/mL. A negative result does not preclude SARS-Cov-2 infection and should not be used as the sole basis for treatment or other  patient management decisions. A negative result may occur with  improper specimen collection/handling, submission of specimen other than nasopharyngeal swab, presence of viral mutation(s) within the areas targeted by this assay, and inadequate number of viral copies(<138 copies/mL). A negative result must be combined with clinical observations, patient history, and epidemiological information. The expected result is Negative.  Fact Sheet for Patients:  BloggerCourse.com  Fact Sheet for Healthcare Providers:  SeriousBroker.it  This test is no t yet approved or cleared by the [place] FDA and  has been authorized for detection and/or diagnosis of SARS-CoV-2 by FDA under an Emergency Use Authorization (EUA). This  EUA will remain  in effect (meaning this test can be used) for the duration of the COVID-19 declaration under Section 564(b)(1) of the Act, 21 U.S.C.section 360bbb-3(b)(1), unless the authorization is terminated  or revoked sooner.       Influenza A by PCR NEGATIVE NEGATIVE Final   Influenza B by PCR NEGATIVE NEGATIVE Final    Comment: (NOTE) The Xpert Xpress SARS-CoV-2/FLU/RSV plus assay is intended as an aid in the diagnosis of influenza from Nasopharyngeal swab specimens and should not be used as a sole basis for treatment. Nasal washings and aspirates are unacceptable for Xpert Xpress SARS-CoV-2/FLU/RSV testing.  Fact Sheet for Patients: BloggerCourse.com  Fact Sheet for Healthcare Providers: SeriousBroker.it  This test is not yet approved or cleared by the [place] FDA and has been authorized for detection and/or diagnosis of SARS-CoV-2 by FDA under an Emergency Use Authorization (EUA). This EUA will remain in effect (meaning this test can be used) for the duration of the COVID-19 declaration under Section 564(b)(1) of the Act, 21 U.S.C. section  360bbb-3(b)(1), unless the authorization is terminated or revoked.     Resp Syncytial Virus by PCR NEGATIVE NEGATIVE Final    Comment: (NOTE) Fact Sheet for Patients: BloggerCourse.com  Fact Sheet for Healthcare Providers: SeriousBroker.it  This test is not yet approved or cleared by the [place] FDA and has been authorized for detection and/or diagnosis of SARS-CoV-2 by FDA under an Emergency Use Authorization (EUA). This EUA will remain in effect (meaning this test can be used) for the duration of the COVID-19 declaration under Section 564(b)(1) of the Act, 21 U.S.C. section 360bbb-3(b)(1), unless the authorization is terminated or revoked.  Performed at [facility], [address], [place], [place] [address]      Labs: BNP (last 3 results) Recent Labs    01/23/23 1055 08/12/23 1424  BNP 68.0 82.0   Basic Metabolic Panel: Recent Labs  Lab 09/18/23 0925 09/19/23 0316 09/20/23 0451 09/21/23 0248 09/22/23 0402  NA 141  140 140 140 138 137  K 4.2  4.2 4.1 3.9 4.0 3.6  CL 102  101 106 107 108 106  CO2 28 27 27 25 25   GLUCOSE 124*  127* 85 102* 84 95  BUN 32*  33* 36* 27* 19 17  CREATININE 1.90*  1.75* 1.54* 1.26* 1.13 1.24  CALCIUM 8.8* 8.1* 8.2* 8.2* 8.4*  MG  --  2.0 1.9 1.8 1.8  PHOS  --  2.8  --   --   --    Liver Function Tests: Recent Labs  Lab 09/18/23 0925 09/19/23 0316  AST 24 24  ALT 16 15  ALKPHOS 70 58  BILITOT 0.6 0.6  PROT 6.8 5.8*  ALBUMIN 3.1* 2.7*   No results for input(s): "LIPASE", "AMYLASE" in the last 168 hours. No results for input(s): "AMMONIA" in the last 168 hours. CBC: Recent Labs  Lab 09/18/23 0925 09/19/23 0316 09/19/23 0913 09/20/23 0451 09/21/23 0248  WBC 7.7 6.3  --  5.3 6.9  NEUTROABS 6.0  --   --   --   --   HGB 12.9*  12.6* 11.6* 12.7* 10.8* 10.6*  HCT 38.0*  40.8 38.8* 42.2 35.1* 35.0*  MCV 102.3* 106.6*  --  106.7* 103.2*  PLT 180 157  --  149*  152   Cardiac Enzymes: No results for input(s): "CKTOTAL", "CKMB", "CKMBINDEX", "TROPONINI" in the last 168 hours. BNP: Invalid input(s): "POCBNP" CBG: Recent Labs  Lab 09/21/23 1650 09/21/23 2049 09/22/23 0003 09/22/23  0416 09/22/23 0743  GLUCAP 80 145* 89 99 88   D-Dimer No results for input(s): "DDIMER" in the last 72 hours. Hgb A1c No results for input(s): "HGBA1C" in the last 72 hours. Lipid Profile No results for input(s): "CHOL", "HDL", "LDLCALC", "TRIG", "CHOLHDL", "LDLDIRECT" in the last 72 hours. Thyroid function studies No results for input(s): "TSH", "T4TOTAL", "T3FREE", "THYROIDAB" in the last 72 hours.  Invalid input(s): "FREET3" Anemia work up No results for input(s): "VITAMINB12", "FOLATE", "FERRITIN", "TIBC", "IRON", "RETICCTPCT" in the last 72 hours. Urinalysis    Component Value Date/Time   COLORURINE YELLOW 08/12/2023 1504   APPEARANCEUR CLEAR 08/12/2023 1504   LABSPEC 1.012 08/12/2023 1504   PHURINE 5.0 08/12/2023 1504   GLUCOSEU NEGATIVE 08/12/2023 1504   HGBUR NEGATIVE 08/12/2023 1504   BILIRUBINUR NEGATIVE 08/12/2023 1504   KETONESUR NEGATIVE 08/12/2023 1504   PROTEINUR NEGATIVE 08/12/2023 1504   NITRITE NEGATIVE 08/12/2023 1504   LEUKOCYTESUR TRACE (A) 08/12/2023 1504   Sepsis Labs Recent Labs  Lab 09/18/23 0925 09/19/23 0316 09/20/23 0451 09/21/23 0248  WBC 7.7 6.3 5.3 6.9   Microbiology Recent Results (from the past 240 hours)  Resp panel by RT-PCR (RSV, Flu A&B, Covid) Anterior Nasal Swab     Status: None   Collection Time: 09/18/23  9:19 AM   Specimen: Anterior Nasal Swab  Result Value Ref Range Status   SARS Coronavirus 2 by RT PCR NEGATIVE NEGATIVE Final    Comment: (NOTE) SARS-CoV-2 target nucleic acids are NOT DETECTED.  The SARS-CoV-2 RNA is generally detectable in upper respiratory specimens during the acute phase of infection. The lowest concentration of SARS-CoV-2 viral copies this assay can detect is 138 copies/mL.  A negative result does not preclude SARS-Cov-2 infection and should not be used as the sole basis for treatment or other patient management decisions. A negative result may occur with  improper specimen collection/handling, submission of specimen other than nasopharyngeal swab, presence of viral mutation(s) within the areas targeted by this assay, and inadequate number of viral copies(<138 copies/mL). A negative result must be combined with clinical observations, patient history, and epidemiological information. The expected result is Negative.  Fact Sheet for Patients:  BloggerCourse.com  Fact Sheet for Healthcare Providers:  SeriousBroker.it  This test is no t yet approved or cleared by the [place] FDA and  has been authorized for detection and/or diagnosis of SARS-CoV-2 by FDA under an Emergency Use Authorization (EUA). This EUA will remain  in effect (meaning this test can be used) for the duration of the COVID-19 declaration under Section 564(b)(1) of the Act, 21 U.S.C.section 360bbb-3(b)(1), unless the authorization is terminated  or revoked sooner.       Influenza A by PCR NEGATIVE NEGATIVE Final   Influenza B by PCR NEGATIVE NEGATIVE Final    Comment: (NOTE) The Xpert Xpress SARS-CoV-2/FLU/RSV plus assay is intended as an aid in the diagnosis of influenza from Nasopharyngeal swab specimens and should not be used as a sole basis for treatment. Nasal washings and aspirates are unacceptable for Xpert Xpress SARS-CoV-2/FLU/RSV testing.  Fact Sheet for Patients: BloggerCourse.com  Fact Sheet for Healthcare Providers: SeriousBroker.it  This test is not yet approved or cleared by the [place] FDA and has been authorized for detection and/or diagnosis of SARS-CoV-2 by FDA under an Emergency Use Authorization (EUA). This EUA will remain in effect (meaning this test  can be used) for the duration of the COVID-19 declaration under Section 564(b)(1) of the Act, 21 U.S.C. section 360bbb-3(b)(1), unless the  authorization is terminated or revoked.     Resp Syncytial Virus by PCR NEGATIVE NEGATIVE Final    Comment: (NOTE) Fact Sheet for Patients: BloggerCourse.com  Fact Sheet for Healthcare Providers: SeriousBroker.it  This test is not yet approved or cleared by the [place] FDA and has been authorized for detection and/or diagnosis of SARS-CoV-2 by FDA under an Emergency Use Authorization (EUA). This EUA will remain in effect (meaning this test can be used) for the duration of the COVID-19 declaration under Section 564(b)(1) of the Act, 21 U.S.C. section 360bbb-3(b)(1), unless the authorization is terminated or revoked.  Performed at [facility], [address], [place], [place] [address]      Time coordinating discharge: 35 minutes  SIGNED:   Erick Blinks, DO [facility] 09/22/2023, 9:54 AM  If 7PM-7AM, please contact night-coverage [email or web address]

## 2023-09-22 NOTE — Care Management Important Message (Signed)
Important Message  Patient Details  Name: Casey Cooley MRN: [id number] Date of Birth: Mar 11, 1933   Important Message Given:  Yes - Medicare IM     Corey Harold 09/22/2023, 10:16 AM

## 2023-09-22 NOTE — Progress Notes (Signed)
Palliative: Casey Cooley is lying quietly in bed.  He appears acutely/chronically ill and very frail.  He is resting comfortably, but wakes easily when I call his name.  He is alert and oriented, able to make his needs known.  There is no family at bedside at this time.  We talked about his acute illness and the treatment plan.  I encouraged him to increase his mobility.  We talked about the plan for returning to [facility] today.  I encouraged him to work closely with staff there.  No questions or concerns at this time.  We talk about the benefits of outpatient palliative services for further support.  Provider choice offered.  Casey Cooley agrees to outpatient palliative services with [facility].  Team updated.  Conference with attending, bedside nursing staff, transition of care team related to patient condition, needs, goals of care, disposition.  Plan: Continue to treat the treatable but no CPR or intubation.  Return to [facility].  Outpatient palliative services with [facility]. DNR/goldenrod form already on chart.  35 minutes  Lillia Carmel, NP Palliative medicine team Team phone [phone]

## 2023-09-22 NOTE — [place] FL2 (Signed)
[place] MEDICAID FL2 LEVEL OF CARE FORM     IDENTIFICATION  Patient Name: Casey Cooley Birthdate: 1933/04/28 Sex: male Admission Date (Current Location): 09/18/2023  [place] [facility] and [place] Number:  Casey Cooley and Address:  [facility],  [address] [place] [address]      Provider Number: [id number]  Attending Physician Name and Address:  Erick Blinks, DO  Relative Name and Phone Number:  Jaquavian, Firkus (Niece)  [phone]    Current Level of Care: Hospital Recommended Level of Care: Assisted Living Facility Prior Approval Number:    Date Approved/Denied:   PASRR Number:    Discharge Plan: Domiciliary (Rest home) (ALF)    Current Diagnoses: Patient Active Problem List   Diagnosis Date Noted   Small bowel obstruction (HCC) 09/18/2023   Vomiting 09/18/2023   Macrocytic anemia 09/18/2023   Hypoalbuminemia due to protein-calorie malnutrition (HCC) 09/18/2023   Diabetes mellitus type 2 in nonobese (HCC) 09/18/2023   Chronic diastolic CHF (congestive heart failure) (HCC) 09/18/2023   Tobacco use disorder 09/18/2023   RSV (respiratory syncytial virus pneumonia) 08/12/2023   Leg swelling 01/29/2023   Syncope and collapse 01/23/2023   Cellulitis and abscess of leg 01/23/2023   Atrial flutter (HCC) 01/23/2023   Chronic kidney disease, stage 3b (HCC) 01/23/2023   COPD (chronic obstructive pulmonary disease) (HCC) 06/25/2020   Acute respiratory failure with hypoxia (HCC) 08/29/2019   Acute kidney injury superimposed on chronic kidney disease (HCC) 08/29/2019   Chronic non-seasonal allergic rhinitis 08/23/2019   Chronic constipation 08/23/2019   Major depression, recurrent, chronic (HCC) 08/23/2019   Increased intraocular pressure, bilateral 08/23/2019   GERD without esophagitis 08/23/2019   Physical deconditioning    Pressure injury of skin 08/17/2019   Generalized weakness    Pneumonia due to COVID-19 virus 08/13/2019   Facet degeneration  of lumbar region 03/28/2018   Mixed hyperlipidemia 05/31/2009   Essential hypertension 05/31/2009    Orientation RESPIRATION BLADDER Height & Weight     Self, Time, Situation  Normal Incontinent Weight: 99.4 kg Height:  6\' 1"  (185.4 cm)  BEHAVIORAL SYMPTOMS/MOOD NEUROLOGICAL BOWEL NUTRITION STATUS      Incontinent Diet (Regular)  AMBULATORY STATUS COMMUNICATION OF NEEDS Skin   Supervision Verbally Normal                       Personal Care Assistance Level of Assistance  Bathing, Feeding, Dressing Bathing Assistance: Limited assistance Feeding assistance: Limited assistance Dressing Assistance: Limited assistance     Functional Limitations Info  Sight, Hearing, Speech Sight Info: Impaired Hearing Info: Adequate Speech Info: Adequate    SPECIAL CARE FACTORS FREQUENCY                       Contractures Contractures Info: Not present    Additional Factors Info  Allergies, Code Status Code Status Info: DNR- Limited Allergies Info: NKDA           Current Medications (09/22/2023):  This is the current hospital active medication list Current Facility-Administered Medications  Medication Dose Route Frequency Provider Last Rate Last Admin   acetaminophen (TYLENOL) tablet 650 mg  650 mg Oral Q6H PRN Adefeso, Oladapo, DO       Or   acetaminophen (TYLENOL) suppository 650 mg  650 mg Rectal Q6H PRN Adefeso, Oladapo, DO       docusate sodium (COLACE) capsule 100 mg  100 mg Oral BID Lucretia Roers, MD   100  mg at 09/22/23 0916   insulin aspart (novoLOG) injection 0-9 Units  0-9 Units Subcutaneous Q4H Adefeso, Oladapo, DO   1 Units at 09/21/23 2150   morphine (PF) 2 MG/ML injection 2 mg  2 mg Intravenous Q4H PRN Adefeso, Oladapo, DO   2 mg at 09/19/23 2140   ondansetron (ZOFRAN) tablet 4 mg  4 mg Oral Q6H PRN Adefeso, Oladapo, DO       Or   ondansetron (ZOFRAN) injection 4 mg  4 mg Intravenous Q6H PRN Adefeso, Oladapo, DO       pantoprazole (PROTONIX) injection  40 mg  40 mg Intravenous Q24H Adefeso, Oladapo, DO   40 mg at 09/21/23 2150     Discharge Medications: Allergies as of 09/22/2023   No Known Allergies      Medication List     TAKE these medications    acetaminophen 325 MG tablet Commonly known as: TYLENOL Take 650 mg by mouth 2 (two) times daily.   apixaban 2.5 MG Tabs tablet Commonly known as: ELIQUIS Take 1 tablet (2.5 mg total) by mouth 2 (two) times daily.   benzocaine 10 % mucosal gel Commonly known as: ORAJEL Use as directed 1 Application in the mouth or throat 3 (three) times daily. Before each meal.   budesonide-formoterol 160-4.5 MCG/ACT inhaler Commonly known as: SYMBICORT Inhale 2 puffs into the lungs 2 (two) times daily.   calcium carbonate 1500 (600 Ca) MG Tabs tablet Commonly known as: OSCAL Take 600 mg of elemental calcium by mouth daily.   cholecalciferol 25 MCG (1000 UNIT) tablet Commonly known as: VITAMIN D3 Take 2,000 Units by mouth daily.   dextromethorphan 30 MG/5ML liquid Commonly known as: Delsym Take 5 mLs (30 mg total) by mouth 2 (two) times daily as needed for cough.   docusate sodium 100 MG capsule Commonly known as: COLACE Take 100 mg by mouth daily.   escitalopram 10 MG tablet Commonly known as: LEXAPRO Take 1 tablet (10 mg total) by mouth daily. What changed: how much to take   fluticasone 50 MCG/ACT nasal spray Commonly known as: FLONASE Place 2 sprays into both nostrils daily as needed for allergies or rhinitis.   furosemide 40 MG tablet Commonly known as: LASIX Take 40 mg by mouth daily.   ipratropium-albuterol 0.5-2.5 (3) MG/3ML Soln Commonly known as: DUONEB Take 3 mLs by nebulization every 4 (four) hours as needed (shortness of breath, cough, wheezing).   lidocaine 4 % Place 1 patch onto the skin daily. Apply 1 patch to mid lower back every day for 12 hours on and 12 hours off.   loperamide 2 MG capsule Commonly known as: IMODIUM Take 2 mg by mouth as needed for  diarrhea or loose stools.   LORazepam 0.5 MG tablet Commonly known as: ATIVAN Take 0.5 tablets (0.25 mg total) by mouth 2 (two) times daily.   Melatonin 10 MG Tabs Take 10 mg by mouth at bedtime.   multivitamin with minerals Tabs tablet Take 1 tablet by mouth daily.   Omeprazole-Sodium Bicarbonate 20-1100 MG Caps capsule Commonly known as: Zegerid OTC Take 1 capsule by mouth daily before breakfast.   ondansetron 4 MG tablet Commonly known as: ZOFRAN Take 4 mg by mouth every 8 (eight) hours as needed for nausea or vomiting. What changed: Another medication with the same name was added. Make sure you understand how and when to take each.   ondansetron 4 MG tablet Commonly known as: ZOFRAN Take 1 tablet (4 mg total) by mouth every  6 (six) hours as needed for nausea. What changed: You were already taking a medication with the same name, and this prescription was added. Make sure you understand how and when to take each.   polyethylene glycol 17 g packet Commonly known as: MIRALAX / GLYCOLAX Take 17 g by mouth daily as needed for moderate constipation.   simvastatin 20 MG tablet Commonly known as: ZOCOR Take 1 tablet (20 mg total) by mouth daily at 6 PM.   sodium chloride 5 % ophthalmic solution Commonly known as: MURO 128 Place 1 drop into both eyes 2 (two) times daily.   tamsulosin 0.4 MG Caps capsule Commonly known as: FLOMAX Take 0.4 mg by mouth daily.   zinc oxide 20 % ointment Apply 1 Application topically daily as needed (Redness).               Discharge Care Instructions  (From admission, onward)           Start     Ordered   09/22/23 0000  If the dressing is still on your incision site when you go home, remove it on the third day after your surgery date. Remove dressing if it begins to fall off, or if it is dirty or damaged before the third day.        09/22/23 0948             Relevant Imaging Results:  Relevant Lab  Results:   Additional Information SS# [id number]  Leitha Bleak, RN

## 2023-09-30 ENCOUNTER — Ambulatory Visit (HOSPITAL_COMMUNITY): Payer: Medicare Other

## 2023-10-20 ENCOUNTER — Emergency Department (HOSPITAL_COMMUNITY)

## 2023-10-20 ENCOUNTER — Other Ambulatory Visit: Payer: Self-pay

## 2023-10-20 ENCOUNTER — Emergency Department (HOSPITAL_COMMUNITY)
Admission: EM | Admit: 2023-10-20 | Discharge: 2023-10-20 | Disposition: A | Discharge status: Home / Self Care | Attending: Emergency Medicine | Admitting: Emergency Medicine

## 2023-10-20 ENCOUNTER — Encounter (HOSPITAL_COMMUNITY): Payer: Self-pay | Admitting: Pharmacy Technician

## 2023-10-20 DIAGNOSIS — W1830XA Fall on same level, unspecified, initial encounter: Secondary | ICD-10-CM | POA: Diagnosis not present

## 2023-10-20 DIAGNOSIS — J449 Chronic obstructive pulmonary disease, unspecified: Secondary | ICD-10-CM | POA: Diagnosis not present

## 2023-10-20 DIAGNOSIS — M545 Low back pain, unspecified: Secondary | ICD-10-CM | POA: Insufficient documentation

## 2023-10-20 DIAGNOSIS — N183 Chronic kidney disease, stage 3 unspecified: Secondary | ICD-10-CM | POA: Diagnosis not present

## 2023-10-20 DIAGNOSIS — Z7901 Long term (current) use of anticoagulants: Secondary | ICD-10-CM | POA: Diagnosis not present

## 2023-10-20 DIAGNOSIS — Z79899 Other long term (current) drug therapy: Secondary | ICD-10-CM | POA: Insufficient documentation

## 2023-10-20 DIAGNOSIS — E1122 Type 2 diabetes mellitus with diabetic chronic kidney disease: Secondary | ICD-10-CM | POA: Insufficient documentation

## 2023-10-20 DIAGNOSIS — I129 Hypertensive chronic kidney disease with stage 1 through stage 4 chronic kidney disease, or unspecified chronic kidney disease: Secondary | ICD-10-CM | POA: Insufficient documentation

## 2023-10-20 DIAGNOSIS — Z7951 Long term (current) use of inhaled steroids: Secondary | ICD-10-CM | POA: Diagnosis not present

## 2023-10-20 DIAGNOSIS — W19XXXA Unspecified fall, initial encounter: Secondary | ICD-10-CM

## 2023-10-20 NOTE — ED Notes (Signed)
 [facility] called to transport patient. Nurse aware

## 2023-10-20 NOTE — ED Triage Notes (Signed)
 Pt bib ems from [place] [facility] [place] after trying to sit on the commode and missing. Pt complains of lower back pain. Did hit head, no longer on eliquis.

## 2023-10-20 NOTE — Discharge Instructions (Signed)
 Casey Cooley had fallen down, and was brought to the emergency room.  The x-rays are normal.  Casey Cooley did not want CT scan of the brain, given that he did not have any direct trauma to the head and he does not have any headaches or neurologic symptoms.    We recommend that he receives neurologic check every 4 hours for the next 12 hours.  If there is any acute change or concerning change, bring him back to the ER.

## 2023-10-20 NOTE — ED Provider Notes (Signed)
 [place] EMERGENCY DEPARTMENT AT [facility] Provider Note   CSN: [id number] Arrival date & time: 10/20/23  1433     History  Chief Complaint  Patient presents with   Casey Cooley is a 89 y.o. male.  HPI    89 y.o. male with medical history significant of hypertension, hyperlipidemia, COPD, CKD stage III, GERD, T2DM, atrial flutter on Eliquis who presents to the emergency department from nursing home after having a mechanical fall.  Patient currently resides at [facility].  I called [phone], but the call was busy.  I also called [phone], but there was no response.  Patient is alert and oriented.  He indicates that he was trying to sit on the commode, when he lost his balance and fell.  He never struck his head onto anything.  He is complaining of pain to his back in the lower region.  He has not walked since the fall.  He does not have any significant pelvis pain.  Patient denies any chest pain, shortness of breath.  He is on Eliquis.    Patient denies any headache, neck pain, one-sided weakness or numbness, nausea, vomiting or vision change.  When I discussed with him that given he is on Eliquis, we would like to proceed with CT scan of the brain, to make sure there is no bleed, he informs me that he would not want any kind of procedure in case CT scan reveals bleed.  He is comfortable with [place] not getting CT scan in this situation at all.  Home Medications Prior to Admission medications   Medication Sig Start Date End Date Taking? Authorizing Provider  acetaminophen (TYLENOL) 325 MG tablet Take 650 mg by mouth 2 (two) times daily.    [provider]  apixaban (ELIQUIS) 2.5 MG TABS tablet Take 1 tablet (2.5 mg total) by mouth 2 (two) times daily. 08/14/23   Johnson, Clanford L, MD  benzocaine (ORAJEL) 10 % mucosal gel Use as directed 1 Application in the mouth or throat 3 (three) times daily. Before each meal.    [provider]  budesonide-formoterol (SYMBICORT) 160-4.5 MCG/ACT inhaler Inhale 2 puffs into the lungs 2 (two) times daily.    [provider]  calcium carbonate (OSCAL) 1500 (600 Ca) MG TABS tablet Take 600 mg of elemental calcium by mouth daily.    [provider]  cholecalciferol (VITAMIN D3) 25 MCG (1000 UT) tablet Take 2,000 Units by mouth daily.    [provider]  dextromethorphan (DELSYM) 30 MG/5ML liquid Take 5 mLs (30 mg total) by mouth 2 (two) times daily as needed for cough. 08/14/23   Johnson, Clanford L, MD  docusate sodium (COLACE) 100 MG capsule Take 100 mg by mouth daily.    [provider]  escitalopram (LEXAPRO) 10 MG tablet Take 1 tablet (10 mg total) by mouth daily. Patient taking differently: Take 5 mg by mouth daily. 09/07/19   Margit Hanks, MD  fluticasone Aleda Grana) 50 MCG/ACT nasal spray Place 2 sprays into both nostrils daily as needed for allergies or rhinitis. 09/07/19   Margit Hanks, MD  furosemide (LASIX) 40 MG tablet Take 40 mg by mouth daily.    [provider]  ipratropium-albuterol (DUONEB) 0.5-2.5 (3) MG/3ML SOLN Take 3 mLs by nebulization every 4 (four) hours as needed (shortness of breath, cough, wheezing). 08/14/23   Johnson, Clanford L, MD  lidocaine 4 % Place 1 patch onto the skin  daily. Apply 1 patch to mid lower back every day for 12 hours on and 12 hours off.    [provider]  loperamide (IMODIUM) 2 MG capsule Take 2 mg by mouth as needed for diarrhea or loose stools.    [provider]  LORazepam (ATIVAN) 0.5 MG tablet Take 0.5 tablets (0.25 mg total) by mouth 2 (two) times daily. 09/22/23   Sherryll Burger, Pratik D, DO  Melatonin 10 MG TABS Take 10 mg by mouth at bedtime.    [provider]  Multiple Vitamin (MULTIVITAMIN WITH MINERALS) TABS tablet Take 1 tablet by mouth daily.    [provider]  Omeprazole-Sodium Bicarbonate (ZEGERID OTC) 20-1100 MG CAPS capsule Take 1 capsule  by mouth daily before breakfast. 09/07/19   Margit Hanks, MD  ondansetron (ZOFRAN) 4 MG tablet Take 4 mg by mouth every 8 (eight) hours as needed for nausea or vomiting.    [provider]  ondansetron (ZOFRAN) 4 MG tablet Take 1 tablet (4 mg total) by mouth every 6 (six) hours as needed for nausea. 09/22/23   Sherryll Burger, Pratik D, DO  polyethylene glycol (MIRALAX / GLYCOLAX) 17 g packet Take 17 g by mouth daily as needed for moderate constipation.    [provider]  simvastatin (ZOCOR) 20 MG tablet Take 1 tablet (20 mg total) by mouth daily at 6 PM. 09/07/19   Margit Hanks, MD  sodium chloride (MURO 128) 5 % ophthalmic solution Place 1 drop into both eyes 2 (two) times daily.    [provider]  tamsulosin (FLOMAX) 0.4 MG CAPS capsule Take 0.4 mg by mouth daily.    [provider]  zinc oxide 20 % ointment Apply 1 Application topically daily as needed (Redness).    [provider]      Allergies    Patient has no known allergies.    Review of Systems   Review of Systems  All other systems reviewed and are negative.   Physical Exam Updated Vital Signs BP 116/72   Pulse 87   Temp 97.7 F (36.5 C)   Resp 18   SpO2 97%  Physical Exam Vitals and nursing note reviewed.  Constitutional:      Appearance: He is well-developed.  HENT:     Head: Atraumatic.  Eyes:     Extraocular Movements: Extraocular movements intact.     Pupils: Pupils are equal, round, and reactive to light.  Cardiovascular:     Rate and Rhythm: Normal rate.  Pulmonary:     Effort: Pulmonary effort is normal.  Musculoskeletal:        General: No swelling, tenderness, deformity or signs of injury.     Cervical back: Neck supple.  Skin:    General: Skin is warm.  Neurological:     Mental Status: He is alert and oriented to person, place, and time.     Cranial Nerves: No cranial nerve deficit.     Sensory: No sensory deficit.     Motor: No weakness.     ED  Results / Procedures / Treatments   Labs (all labs ordered are listed, but only abnormal results are displayed) Labs Reviewed - No data to display  EKG None  Radiology No results found.  Procedures Procedures    Medications Ordered in ED Medications - No data to display  ED Course/ Medical Decision Making/ A&P  Medical Decision Making Amount and/or Complexity of Data Reviewed Radiology: ordered.   89 year old patient comes in after sustaining what appears to be a mechanical fall. Pertinent past medical includes : Eliquis use because of A-fib.  Patient currently resides at nursing home. Collateral history provided by : Patient's records.  He was recently admitted to the hospital for small obstruction.  Discharge summary reviewed.  Based on my history and exam, differential diagnosis includes: - Traumatic brain injury including intracranial hemorrhage - Long bone fractures - Contusions -particularly to the lower spine - Soft tissue injury  Based on the initial assessment, the following workup was initiated x-ray of the coccyx and pelvis.  Patient does not have any headache or any red flags suggesting ICP.  He denies any direct head trauma.  Given that he is on Eliquis, I informed him that we would like to get a CT scan of the brain, however he is apprehensive about getting CT scan.  He states that he never struck his head.  I discussed with him that sometimes patients can have bleed even without having direct blunt trauma to the head.  When I asked him directly if he would want any surgical intervention in case there was brain bleed, patient labs and indicated that he would not be interested in getting any brain surgery.  I noticed that patient is enrolled by hospice.  I did try to call patient's wife and also may have done, but there was no response.  I have independently interpreted the following imaging from the perspective of acute trauma:  X-ray of the pelvis, and the results indicate no fracture.  The patient appears reasonably screened and/or stabilized for discharge and I doubt any other medical condition or other [facility] requiring further screening, evaluation, or treatment in the ED at this time prior to discharge.   Results from the ER workup discussed with the patient face to face and all questions answered to the best of my ability. The patient is safe for discharge with strict return precautions.  Final Clinical Impression(s) / ED Diagnoses Final diagnoses:  Fall, initial encounter    Rx / DC Orders ED Discharge Orders     None         Derwood Kaplan, MD 10/20/23 [phone]

## 2023-12-10 DEATH — deceased
